# Patient Record
Sex: Female | Born: 1965
Health system: Southern US, Community
[De-identification: ages and names within clinical notes are randomized; demographics above are authoritative.]

## PROBLEM LIST (undated history)

## (undated) DIAGNOSIS — L930 Discoid lupus erythematosus: Secondary | ICD-10-CM

## (undated) DIAGNOSIS — J45909 Unspecified asthma, uncomplicated: Secondary | ICD-10-CM

## (undated) DIAGNOSIS — F419 Anxiety disorder, unspecified: Secondary | ICD-10-CM

## (undated) DIAGNOSIS — R569 Unspecified convulsions: Secondary | ICD-10-CM

## (undated) DIAGNOSIS — G4730 Sleep apnea, unspecified: Secondary | ICD-10-CM

## (undated) DIAGNOSIS — R51 Headache: Secondary | ICD-10-CM

## (undated) DIAGNOSIS — T7840XA Allergy, unspecified, initial encounter: Secondary | ICD-10-CM

## (undated) DIAGNOSIS — G4700 Insomnia, unspecified: Secondary | ICD-10-CM

## (undated) DIAGNOSIS — N189 Chronic kidney disease, unspecified: Secondary | ICD-10-CM

## (undated) DIAGNOSIS — J449 Chronic obstructive pulmonary disease, unspecified: Secondary | ICD-10-CM

## (undated) DIAGNOSIS — K219 Gastro-esophageal reflux disease without esophagitis: Secondary | ICD-10-CM

## (undated) DIAGNOSIS — M779 Enthesopathy, unspecified: Secondary | ICD-10-CM

## (undated) DIAGNOSIS — K7460 Unspecified cirrhosis of liver: Secondary | ICD-10-CM

## (undated) HISTORY — DX: Discoid lupus erythematosus: L93.0

## (undated) HISTORY — PX: DIAGNOSTIC LAPAROSCOPY: SUR761

## (undated) HISTORY — DX: Sleep apnea, unspecified: G47.30

## (undated) HISTORY — PX: KIDNEY SURGERY: SHX687

## (undated) HISTORY — DX: Unspecified cirrhosis of liver: K74.60

## (undated) HISTORY — DX: Chronic obstructive pulmonary disease, unspecified: J44.9

## (undated) HISTORY — PX: RIGHT OOPHORECTOMY: SHX2359

## (undated) HISTORY — PX: CHOLECYSTECTOMY: SHX55

## (undated) HISTORY — PX: UPPER GASTROINTESTINAL ENDOSCOPY: SHX188

## (undated) HISTORY — DX: Allergy, unspecified, initial encounter: T78.40XA

## (undated) HISTORY — PX: FOOT SURGERY: SHX648

## (undated) HISTORY — PX: COLONOSCOPY: SHX174

## (undated) HISTORY — PX: APPENDECTOMY: SHX54

## (undated) HISTORY — PX: ABDOMINAL HYSTERECTOMY: SHX81

---
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
## 1999-05-28 ENCOUNTER — Emergency Department (HOSPITAL_COMMUNITY): Admission: EM | Admit: 1999-05-28 | Discharge: 1999-05-28 | Payer: Self-pay | Admitting: Emergency Medicine

## 2000-01-20 ENCOUNTER — Emergency Department (HOSPITAL_COMMUNITY): Admission: EM | Admit: 2000-01-20 | Discharge: 2000-01-20 | Payer: Self-pay | Admitting: Internal Medicine

## 2000-05-01 ENCOUNTER — Other Ambulatory Visit: Admission: RE | Admit: 2000-05-01 | Discharge: 2000-05-01 | Payer: Self-pay | Admitting: Obstetrics and Gynecology

## 2000-08-12 ENCOUNTER — Ambulatory Visit (HOSPITAL_COMMUNITY): Admission: RE | Admit: 2000-08-12 | Discharge: 2000-08-12 | Payer: Self-pay | Admitting: Obstetrics and Gynecology

## 2002-09-08 ENCOUNTER — Other Ambulatory Visit: Admission: RE | Admit: 2002-09-08 | Discharge: 2002-09-08 | Payer: Self-pay | Admitting: Obstetrics and Gynecology

## 2003-08-06 ENCOUNTER — Encounter: Payer: Self-pay | Admitting: Obstetrics and Gynecology

## 2003-08-11 ENCOUNTER — Encounter (INDEPENDENT_AMBULATORY_CARE_PROVIDER_SITE_OTHER): Payer: Self-pay | Admitting: Specialist

## 2003-08-11 ENCOUNTER — Inpatient Hospital Stay (HOSPITAL_COMMUNITY): Admission: RE | Admit: 2003-08-11 | Discharge: 2003-08-13 | Payer: Self-pay | Admitting: Obstetrics and Gynecology

## 2003-08-11 ENCOUNTER — Encounter (INDEPENDENT_AMBULATORY_CARE_PROVIDER_SITE_OTHER): Payer: Self-pay

## 2003-08-23 ENCOUNTER — Encounter: Admission: RE | Admit: 2003-08-23 | Discharge: 2003-08-23 | Payer: Self-pay | Admitting: Obstetrics and Gynecology

## 2003-08-23 ENCOUNTER — Encounter: Payer: Self-pay | Admitting: Obstetrics and Gynecology

## 2004-08-07 ENCOUNTER — Emergency Department (HOSPITAL_COMMUNITY): Admission: EM | Admit: 2004-08-07 | Discharge: 2004-08-07 | Payer: Self-pay | Admitting: Emergency Medicine

## 2004-08-11 ENCOUNTER — Observation Stay (HOSPITAL_COMMUNITY): Admission: RE | Admit: 2004-08-11 | Discharge: 2004-08-12 | Payer: Self-pay | Admitting: General Surgery

## 2004-08-11 ENCOUNTER — Encounter (INDEPENDENT_AMBULATORY_CARE_PROVIDER_SITE_OTHER): Payer: Self-pay | Admitting: *Deleted

## 2006-09-05 ENCOUNTER — Emergency Department (HOSPITAL_COMMUNITY): Admission: EM | Admit: 2006-09-05 | Discharge: 2006-09-05 | Payer: Self-pay | Admitting: Emergency Medicine

## 2006-09-17 ENCOUNTER — Encounter: Admission: RE | Admit: 2006-09-17 | Discharge: 2006-10-08 | Payer: Self-pay | Admitting: Family Medicine

## 2007-02-02 ENCOUNTER — Emergency Department (HOSPITAL_COMMUNITY): Admission: EM | Admit: 2007-02-02 | Discharge: 2007-02-02 | Payer: Self-pay | Admitting: Emergency Medicine

## 2009-01-26 ENCOUNTER — Ambulatory Visit: Payer: Self-pay | Admitting: Family Medicine

## 2009-01-26 ENCOUNTER — Encounter (INDEPENDENT_AMBULATORY_CARE_PROVIDER_SITE_OTHER): Payer: Self-pay | Admitting: *Deleted

## 2009-01-26 DIAGNOSIS — L930 Discoid lupus erythematosus: Secondary | ICD-10-CM | POA: Insufficient documentation

## 2009-01-26 DIAGNOSIS — J019 Acute sinusitis, unspecified: Secondary | ICD-10-CM

## 2009-02-09 ENCOUNTER — Ambulatory Visit: Payer: Self-pay | Admitting: Family Medicine

## 2009-03-09 ENCOUNTER — Ambulatory Visit: Payer: Self-pay | Admitting: Family Medicine

## 2009-03-09 DIAGNOSIS — M25579 Pain in unspecified ankle and joints of unspecified foot: Secondary | ICD-10-CM

## 2009-04-30 HISTORY — PX: ANKLE RECONSTRUCTION: SHX1151

## 2009-05-06 ENCOUNTER — Telehealth (INDEPENDENT_AMBULATORY_CARE_PROVIDER_SITE_OTHER): Payer: Self-pay | Admitting: *Deleted

## 2009-05-12 ENCOUNTER — Encounter: Payer: Self-pay | Admitting: Family Medicine

## 2010-02-15 ENCOUNTER — Ambulatory Visit: Payer: Self-pay | Admitting: Family Medicine

## 2010-04-03 ENCOUNTER — Ambulatory Visit: Payer: Self-pay | Admitting: Family Medicine

## 2010-04-03 ENCOUNTER — Telehealth (INDEPENDENT_AMBULATORY_CARE_PROVIDER_SITE_OTHER): Payer: Self-pay | Admitting: *Deleted

## 2010-04-03 ENCOUNTER — Telehealth: Payer: Self-pay | Admitting: Family Medicine

## 2010-04-06 ENCOUNTER — Telehealth (INDEPENDENT_AMBULATORY_CARE_PROVIDER_SITE_OTHER): Payer: Self-pay | Admitting: *Deleted

## 2010-05-16 ENCOUNTER — Telehealth (INDEPENDENT_AMBULATORY_CARE_PROVIDER_SITE_OTHER): Payer: Self-pay | Admitting: *Deleted

## 2010-05-22 ENCOUNTER — Ambulatory Visit: Payer: Self-pay | Admitting: Family Medicine

## 2010-05-22 DIAGNOSIS — H6690 Otitis media, unspecified, unspecified ear: Secondary | ICD-10-CM | POA: Insufficient documentation

## 2010-05-22 DIAGNOSIS — L049 Acute lymphadenitis, unspecified: Secondary | ICD-10-CM

## 2010-05-25 ENCOUNTER — Telehealth (INDEPENDENT_AMBULATORY_CARE_PROVIDER_SITE_OTHER): Payer: Self-pay | Admitting: *Deleted

## 2010-06-09 ENCOUNTER — Ambulatory Visit: Payer: Self-pay | Admitting: Family Medicine

## 2011-01-22 ENCOUNTER — Ambulatory Visit
Admission: RE | Admit: 2011-01-22 | Discharge: 2011-01-22 | Payer: Self-pay | Source: Home / Self Care | Attending: Family Medicine | Admitting: Family Medicine

## 2011-01-22 ENCOUNTER — Ambulatory Visit (HOSPITAL_BASED_OUTPATIENT_CLINIC_OR_DEPARTMENT_OTHER)
Admission: RE | Admit: 2011-01-22 | Discharge: 2011-01-22 | Payer: Self-pay | Source: Home / Self Care | Attending: Family Medicine | Admitting: Family Medicine

## 2011-01-22 LAB — CONVERTED CEMR LAB
Blood in Urine, dipstick: NEGATIVE
Ketones, urine, test strip: NEGATIVE
Nitrite: NEGATIVE
Urobilinogen, UA: 0.2
WBC Urine, dipstick: NEGATIVE

## 2011-01-23 ENCOUNTER — Telehealth: Payer: Self-pay | Admitting: Family Medicine

## 2011-01-30 NOTE — Assessment & Plan Note (Signed)
Summary: COUGH/RH.....   Vital Signs:  Patient profile:   45 year old female Height:      61.75 inches Weight:      185 pounds BMI:     34.23 Temp:     98.3 degrees F oral Pulse rate:   82 / minute Pulse rhythm:   regular BP sitting:   128 / 82  (left arm) Cuff size:   regular  Vitals Entered By: Army Fossa CMA (February 15, 2010 9:37 AM) CC: cough , congestion   History of Present Illness:  Cough      This is a 45 year old woman who presents with Cough.  The symptoms began 3 weeks ago.  Pt here c/o cough--she has taken Dayquil with no relief.  The patient reports productive cough, wheezing, and fever, but denies non-productive cough, pleuritic chest pain, shortness of breath, exertional dyspnea, hemoptysis, and malaise.  Associated symtpoms include nasal congestion.  The patient denies the following symptoms: cold/URI symptoms, sore throat, chronic rhinitis, weight loss, acid reflux symptoms, and peripheral edema.  The cough is worse with lying down.  Ineffective prior treatments have included OTC cough medication.  Pt just finished clindamycin from the dentist.    Current Medications (verified): 1)  Biaxin Xl Pac 500 Mg Xr24h-Tab (Clarithromycin) .... 2 By Mouth Once Daily 2)  Prednisone 10 Mg Tabs (Prednisone) .... 3 By Mouth Once Daily For 3 Days Then 2 By Mouth Once Daily For 3 Days Then 1 By Mouth Once Daily For 3 Days 3)  Cheratussin Ac 100-10 Mg/41ml Syrp (Guaifenesin-Codeine) .Marland Kitchen.. 1-2 Tsp By Mouth At Bedtime As Needed 4)  Proair Hfa 108 (90 Base) Mcg/act Aers (Albuterol Sulfate) .... 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  Past Medical History: Reviewed history from 01/26/2009 and no changes required. Discoid Lupus -Dr. Yetta Barre dermatologist  Past Surgical History: Reviewed history from 01/26/2009 and no changes required. Appendectomy Hysterectomy-10cm growth removal, [facility]- right kidney ('tube was kinked and they went in and straightened it out') Oophorectomy-right removed  Family History: Reviewed history from 01/26/2009 and no changes required. CAD-mother HTN-mother,father DM-no COLON CA-grandfather BREAST CA-no  Social History: Reviewed history from 01/26/2009 and no changes required. 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  Otoscopic examination reveals clear canals, tympanic membranes are intact bilaterally without bulging, retraction, inflammation or discharge. Hearing is grossly normal bilaterally. Nose:  External nasal examination shows no deformity or inflammation. Nasal mucosa are pink and moist without lesions or exudates. Mouth:  Oral mucosa and oropharynx without lesions or exudates.  Teeth in good repair. Neck:  cervical lymphadenopathy.   Lungs:  R decreased breath sounds, R wheezes, L decreased breath sounds, and L wheezes.   Heart:  normal rate and no murmur.   Psych:  Oriented X3 and normally interactive.     Impression & Recommendations:  Problem # 1:  BRONCHITIS- ACUTE (ICD-466.0)  The following  medications were removed from the medication list:    Tussionex Pennkinetic Er 8-10 Mg/72ml Lqcr (Chlorpheniramine-hydrocodone) .Marland KitchenMarland KitchenMarland KitchenMarland Kitchen 5 ml by mouth q12 as needed for cough.  disp Her updated  medication list for this problem includes:    Biaxin Xl Pac 500 Mg Xr24h-tab (Clarithromycin) .Marland Kitchen... 2 by mouth once daily    Cheratussin Ac 100-10 Mg/13ml Syrp (Guaifenesin-codeine) .Marland Kitchen... 1-2 tsp by mouth at bedtime as needed    Proair Hfa 108 (90 Base) Mcg/act Aers (Albuterol sulfate) .Marland Kitchen... 2 puffs qid as needed  Take antibiotics and other medications as directed. Encouraged to push clear liquids, get enough rest, and take acetaminophen as needed. To be seen in 5-7 days if no improvement, sooner if worse.  Orders: Admin of Therapeutic Inj  intramuscular or subcutaneous (16109) Depo- Medrol 80mg  (J1040) Nebulizer Tx (60454) Albuterol Sulfate Sol 1mg  unit dose (U9811)  Complete Medication List: 1)  Biaxin Xl Pac 500 Mg Xr24h-tab (Clarithromycin) .... 2 by mouth once daily 2)  Prednisone 10 Mg Tabs (Prednisone) .... 3 by mouth once daily for 3 days then 2 by mouth once daily for 3 days then 1 by mouth once daily for 3 days 3)  Cheratussin Ac 100-10 Mg/50ml Syrp (Guaifenesin-codeine) .Marland Kitchen.. 1-2 tsp by mouth at bedtime as needed 4)  Proair Hfa 108 (90 Base) Mcg/act Aers (Albuterol sulfate) .... 2 puffs qid as needed Prescriptions: PROAIR HFA 108 (90 BASE) MCG/ACT AERS (ALBUTEROL SULFATE) 2 puffs qid as needed  #1 x 1   Entered and Authorized by:   Loreen Freud DO   Signed by:   Loreen Freud DO on 02/15/2010   Method used:   Electronically to        [facility]* (retail)       [address] [place], [place]  [address]       Ph: [phone] or [phone]       Fax: [phone]   RxID:   [id number] CHERATUSSIN AC 100-10 MG/5ML SYRP (GUAIFENESIN-CODEINE) 1-2 tsp by mouth at bedtime as needed  #6 oz x 0   Entered and Authorized by:   Loreen Freud DO   Signed by:   Loreen Freud DO on 02/15/2010   Method used:   Print then Give to Patient   RxID:   [id number] PREDNISONE 10 MG TABS (PREDNISONE) 3 by mouth once daily for 3  days then 2 by mouth once daily for 3 days then 1 by mouth once daily for 3 days  #18 x 0   Entered and Authorized by:   Loreen Freud DO   Signed by:   Loreen Freud DO on 02/15/2010   Method used:   Electronically to        [facility]* (retail)       [address] [place], [place]  [address]       Ph: [phone] or [phone]       Fax: [phone]   RxID:   [id number] BIAXIN XL PAC 500 MG XR24H-TAB (CLARITHROMYCIN) 2 by mouth once daily  #14 x 0   Entered and Authorized by:   Loreen Freud DO   Signed by:   Loreen Freud DO on 02/15/2010   Method used:   Electronically to        [facility]* (retail)       [address] [place], [place]  [address]       Ph: [phone] or [phone]       Fax: [phone]   RxID:   [id number]    Medication Administration  Injection # 1:    Medication: Depo- Medrol 80mg     Diagnosis: BRONCHITIS- ACUTE (ICD-466.0)    Route: IM    Site: RUOQ gluteus    Exp Date: 09/2010    Lot #: [id number]    Mfr: [email or web address]    Patient tolerated injection without complications    Given by: Army Fossa CMA (February 15, 2010 10:36 AM)  Medication # 1:    Medication: Albuterol Sulfate Sol 1mg  unit dose    Diagnosis: BRONCHITIS- ACUTE (ICD-466.0)  Orders Added: 1)  Admin of Therapeutic Inj  intramuscular or subcutaneous [96372] 2)  Depo- Medrol 80mg  [J1040] 3)  Nebulizer Tx [94854] 4)  Albuterol Sulfate Sol 1mg  unit dose [J7613] 5)  Est. Patient Level IV [62703]

## 2011-01-30 NOTE — Progress Notes (Signed)
Summary: FYI call a nurse  Phone Note Other Incoming   Summary of Call: Triage Call Report Triage Record Num: [id number] Operator: Di Kindle Patient Name: Donna Sullivan Call Date & Time: 04/03/2010 6:16:54AM Patient Phone: [phone] PCP: Lezlie Octave Patient Gender: Female PCP Fax : [phone] Patient DOB: [birth year] Practice Name: [facility] Reason for Call: Pt calling, last seen for bronchitis 02/15/10, out of ABX, still taking prednisone, started to take 04/02/10 because symtpoms have returned for about a week, had temp for 102 for a couple of day, afebrile. Chest hurts when coughs. Guideline: cough, advised of needing appt, to call office when open 04/03/10, verbalizes understanding. Protocol(s) Used: Cough - Adult Recommended Outcome per Protocol: See Provider within 24 hours Reason for Outcome: Sharp, fleeting chest pain only occurring with deep breath or cough AND not responsive to home care Care Advice:  ~ Use a cool mist humidifier to moisten air. Be sure to clean according to manufacturer's instructions.  ~ Limit activities and increase periods of rest.  ~ Call provider if having shortness of breath. 04/  Follow-up for Phone Call        Called pt this am no answer go VM; Left msg for pt to call .Kandice Hams  April 03, 2010 8:16 AM Spoke with pt who did not go to ED over weekend, c/o chest congestion and cough.  OV scheduled for today .Kandice Hams  April 03, 2010 8:58 AM   Follow-up by: Kandice Hams,  April 03, 2010 8:58 AM

## 2011-01-30 NOTE — Assessment & Plan Note (Signed)
Summary: allergies??//dizzy//lch   Vital Signs:  Patient profile:   45 year old female Weight:      196 pounds O2 Sat:      97 % on Room air Temp:     98.6 degrees F Pulse rate:   82 / minute BP sitting:   110 / 80  (left arm)  Vitals Entered By: Doristine Devoid (May 22, 2010 2:06 PM)  O2 Flow:  Room air CC: R ear pain, cough and congestion also w/ some intermittent dizziness    History of Present Illness: 45 yo woman here today for R ear pain, cough, and congestion.  this is 3rd week of sxs.  Tm 102.  + sick contacts (strep).  + sore throat- 'like it is on fire'.  soreness under R armpit as of this AM- painful to put on deodorant.  cough is dry.  nasal congestion is clear.  is on Clarithromycin from the dentist  Allergies (verified): No Known Drug Allergies  Review of Systems      See HPI  Physical Exam  General:  obviously uncomfortable Head:  NCAT, no TTP over sinuses Eyes:  mild injxn Ears:  R TM dull, erythematous, fluid visible L TM mildly retracted Nose:  lateral wall of R nostril raw + congestion Mouth:  + pND Neck:  cervical lymphadenopathy.   Lungs:  coarse BS in R upper lobe L lung CTA Heart:  normal rate and no murmur.   Axillary Nodes:  R axillary LN enlarged and tender.     Impression & Recommendations:  Problem # 1:  COUGH (ICD-786.2) Assessment New given coarse BS in R upper lobe, fever, and duration of sxs will get CXR to r/o PNA.  start Augmentin.  Tussionex for cough. Orders: T-2 View CXR (71020TC)  Problem # 2:  ROM (ICD-382.9) Assessment: New given that pt is on clarithromycin from the dentist will tx w/ augmentin. Her updated medication list for this problem includes:    Augmentin 875-125 Mg Tabs (Amoxicillin-pot clavulanate) .Marland Kitchen... 1 by mouth 2 times daily  Problem # 3:  ACUTE LYMPHADENITIS (ICD-683) Assessment: New pt very tender in R axilla.  Augmentin for R OM will cover.  recheck in 2 weeks. Her updated medication list for this  problem includes:    Augmentin 875-125 Mg Tabs (Amoxicillin-pot clavulanate) .Marland Kitchen... 1 by mouth 2 times daily  Complete Medication List: 1)  Proair Hfa 108 (90 Base) Mcg/act Aers (Albuterol sulfate) .... 2 puffs qid as needed 2)  Augmentin 875-125 Mg Tabs (Amoxicillin-pot clavulanate) .Marland Kitchen.. 1 by mouth 2 times daily 3)  Tussionex Pennkinetic Er 8-10 Mg/61ml Lqcr (Chlorpheniramine-hydrocodone) .Marland Kitchen.. 1 tsp q12 as needed for cough.  disp  Patient Instructions: 1)  Please schedule a follow-up appointment in 2 weeks to recheck your lymph node. 2)  Go to [address] to get your chest xray 3)  Use the codeine cough syrup as needed 4)  Ibuprofen for pain/fever 5)  Take the Augmentin as directed- take w/ food to avoid upset stomach.  If you develop yeast symptoms just call me 6)  Use your albuterol inhaler for the wheezing 7)  Call with any questions or concerns 8)  Hang in there!!  Prescriptions: TUSSIONEX PENNKINETIC ER 8-10 MG/5ML LQCR (CHLORPHENIRAMINE-HYDROCODONE) 1 tsp Q12 as needed for cough.  disp  #150 x 0   Entered and Authorized by:   Neena Rhymes MD   Signed by:   Neena Rhymes MD on 05/22/2010   Method used:  Print then Give to Patient   RxID:   [id number] CHERATUSSIN AC 100-10 MG/5ML SYRP (GUAIFENESIN-CODEINE) 1-2 tsp by mouth at bedtime as needed  #6 oz x 0   Entered and Authorized by:   Neena Rhymes MD   Signed by:   Neena Rhymes MD on 05/22/2010   Method used:   Print then Give to Patient   RxID:   [id number] AUGMENTIN 875-125 MG TABS (AMOXICILLIN-POT CLAVULANATE) 1 by mouth 2 times daily  #20 x 0   Entered and Authorized by:   Neena Rhymes MD   Signed by:   Neena Rhymes MD on 05/22/2010   Method used:   Print then Give to Patient   RxID:   [id number]

## 2011-01-30 NOTE — Progress Notes (Signed)
Summary: fyi seen 4/4/11o better  Phone Note Call from Patient Call back at Home Phone [phone]   Caller: Patient Summary of Call: Seen 04/03/10 still has cough Used Cheratussin syrup pt says rx almost gone and did not help with cough -Tongue is coated and blisters in mouth and around mouth, informed pt to D/C azithromycin, pt says she has 1 pill left and will take --Pt has Nystatin she has been using for 2 days  from dentist office, and has 2 prednisone left. --Low grade fever arond 99 .Kandice Hams  April 06, 2010 3:07 PM   Follow-up for Phone Call        pt was informed that the medicine will be effective for 10-12 days.  she was only seen 3 days ago.  must give it more time.  if pt is having trouble w/ cough can buy Delsym OTC.  steroids can cause mouth yeast but she is already using Nystatin.  should continue.  can stop steroids.  if her illness is viral and not bacterial, no abx will help- only time.  pt should be reassured that CXR was normal.  she should hang in there- this will get better. Follow-up by: Neena Rhymes MD,  April 06, 2010 3:24 PM  Additional Follow-up for Phone Call Additional follow up Details #1::        pt informed of Dr Beverely Low recommendations .Kandice Hams  April 06, 2010 4:13 PM  Additional Follow-up by: Kandice Hams,  April 06, 2010 4:13 PM

## 2011-01-30 NOTE — Progress Notes (Signed)
  Phone Note Call from Patient Call back at [facility] Phone [phone]   Caller: Patient Summary of Call: pt c/o dizziness when standing up, Pt is taking her meds. Per Dr Beverely Low will call in some Meclizine pt uses [facility] Initial call taken by: Kandice Hams,  May 25, 2010 4:31 PM    New/Updated Medications: MECLIZINE HCL 25 MG TABS (MECLIZINE HCL) 1 by mouth every 6 hours as needed Prescriptions: MECLIZINE HCL 25 MG TABS (MECLIZINE HCL) 1 by mouth every 6 hours as needed  #30 x 0   Entered by:   Kandice Hams   Authorized by:   Neena Rhymes MD   Signed by:   Kandice Hams on 05/25/2010   Method used:   Faxed to ...       [facility]* (retail)       [address] [place], [place]  [address]       Ph: [phone] or [phone]       Fax: [phone]   RxID:   [id number]

## 2011-01-30 NOTE — Assessment & Plan Note (Signed)
Summary: cough,chest congestion,sore throat/alr   Vital Signs:  Patient profile:   45 year old female Weight:      196 pounds O2 Sat:      97 % on Room air Temp:     99.0 degrees F oral Pulse rate:   102 / minute BP sitting:   120 / 80  (left arm)  Vitals Entered By: Doristine Devoid (April 03, 2010 2:55 PM)  O2 Flow:  Room air CC: cough and chest congestion some SOB from excess coughing  Comments -has been using Mucinex,Motrin,Dulera, w/o no relief   History of Present Illness: 45 yo woman here today w/ cough and chest congestion since 2/15- dx'd w/ bronchitis and tx'd w/ abx, steroids, cough meds, inhalers.  Improved until husband got sick 1 week ago.  pt started w/ fever to 102 on Wed w/ body aches, cough, chest congestion.  started Prednisone last night to improve breathing.  pt called last night w/ SOB, rapid heart rate, sweats.  pt reports cough and inability to sleep are most distressing.  pt is current smoker.  ran out of rescue inhaler yesterday.  was also on Dulera during last illness.  using mucinex, nyquil w/out relief.  Current Medications (verified): 1)  Cheratussin Ac 100-10 Mg/27ml Syrp (Guaifenesin-Codeine) .Marland Kitchen.. 1-2 Tsp By Mouth At Bedtime As Needed 2)  Proair Hfa 108 (90 Base) Mcg/act Aers (Albuterol Sulfate) .... 2 Puffs Qid As Needed 3)  Azithromycin 250 Mg  Tabs (Azithromycin) .... 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     Impression & Recommendations:  Problem # 1:  BRONCHITIS- ACUTE (ICD-466.0) Assessment New pt w/ bronchitis clinically.  will check CXR to r/o PNA.  start Azithro.  pt to finish steroids that she was given at last visit but never took.  use albuterol as needed- no wheezes appreciated today.  cough syrup as needed.  reviewed supportive care and red flags that should prompt return.  Pt expresses understanding and is in agreement w/ this plan. Her updated medication list for this problem includes:    Cheratussin Ac 100-10 Mg/17ml Syrp (Guaifenesin-codeine) .Marland Kitchen... 1-2 tsp by mouth at bedtime as needed    Proair Hfa 108 (90 Base) Mcg/act Aers (Albuterol sulfate) .Marland Kitchen... 2 puffs qid as needed    Azithromycin 250 Mg Tabs (Azithromycin) .Marland Kitchen... 2 by  mouth today and then 1 daily for 4 days  Orders: T-2 View CXR (71020TC)  Complete Medication List: 1)  Cheratussin Ac 100-10 Mg/15ml Syrp (Guaifenesin-codeine) .Marland Kitchen.. 1-2 tsp by mouth at bedtime as needed 2)  Proair Hfa 108 (90 Base) Mcg/act Aers (Albuterol sulfate) .... 2 puffs qid as needed 3)  Azithromycin 250 Mg Tabs (Azithromycin) .... 2 by  mouth today and then 1 daily for 4 days  Patient Instructions: 1)  Follow  up as needed 2)  Go to [address] to get your chest xray- we'll call you with the results 3)  Take the Azithromycin as directed 4)  Drink plenty of fluids 5)  Use the ProAir- 2 puffs every 4 hours as needed 6)  Tylenol/Motrin as needed for fever or pain 7)  Take your remaining prednisone 8)  If no improvement in the next 5-7 days, please call!! 9)  Hang in there!!! Prescriptions: AZITHROMYCIN 250 MG  TABS (AZITHROMYCIN) 2 by  mouth today and then 1 daily for 4 days  #6 x 0   Entered and Authorized by:   Neena Rhymes MD   Signed by:   Neena Rhymes MD on  04/03/2010   Method used:   Print then Give to Patient   RxID:   [id number] PROAIR HFA 108 (90 BASE) MCG/ACT AERS (ALBUTEROL SULFATE) 2 puffs qid as needed  #1 x 1   Entered and Authorized by:   Neena Rhymes MD   Signed by:   Neena Rhymes MD on 04/03/2010   Method used:   Print then Give to Patient   RxID:   [id number] CHERATUSSIN AC 100-10 MG/5ML SYRP (GUAIFENESIN-CODEINE) 1-2 tsp by mouth at bedtime as needed  #6 oz x 0   Entered and Authorized by:   Neena Rhymes MD   Signed by:   Neena Rhymes MD on 04/03/2010   Method used:   Print then Give to Patient   RxID:   [id number]

## 2011-01-30 NOTE — Progress Notes (Signed)
Summary: phone/  Phone Note Call from Patient Call back at [facility] Phone [phone]   Caller: Patient Summary of Call: PT CALLED LEFT STILL  NOT FEELING WELL  CALLED PT BACK MAILBOX IS FULL CANNOT LEAVE MSG WILL TRY IN AM .Kandice Hams  May 16, 2010 5:04 PM CALLED PT THIS AM MAIBOX FULL COULD NOT LEAVE MSG .Kandice Hams  May 17, 2010 8:52 AM  Pt called back, c/o cough congestion, some sob, OV scheduled .Kandice Hams  May 17, 2010 11:09 AM      Initial call taken by: Kandice Hams,  May 16, 2010 5:04 PM

## 2011-01-30 NOTE — Progress Notes (Signed)
Summary: xray results-  Phone Note Outgoing Call   Call placed by: Doristine Devoid,  April 03, 2010 5:03 PM Call placed to: Patient Summary of Call: no PNA.  continue with plan as discussed at office visit   Follow-up for Phone Call        left message on machine ...Marland KitchenMarland KitchenDoristine Devoid  April 03, 2010 5:03 PM   patient aware of xray results says she is having a hard time getting some sleep at night and wanted to know if she go get something called into pharmacy says she only needs 1 pill has been taking cough med but it keeps her up at night. Informed that I will informed Dr. Beverely Low and let her know.Marland KitchenMarland KitchenMarland KitchenDoristine Devoid  April 04, 2010 10:09 AM   [facility]  Additional Follow-up for Phone Call Additional follow up Details #1::        Ok for Tylenol or Advil PM...if not effective can give samples of Edular 10mg . Additional Follow-up by: Neena Rhymes MD,  April 04, 2010 10:23 AM    Additional Follow-up for Phone Call Additional follow up Details #2::    spoke w/ patient says she is unable to take tylenol pm or ambien b/c they keep her up suggested she take benadryl to see if this helps..........Marland KitchenDoristine Devoid  April 04, 2010 10:31 AM

## 2011-01-30 NOTE — Assessment & Plan Note (Signed)
Summary: f/u in 2 weeks//lch// res cbs   Vital Signs:  Patient profile:   45 year old female Weight:      193 pounds Pulse rate:   86 / minute BP sitting:   132 / 70  (left arm)  Vitals Entered By: Doristine Devoid (June 09, 2010 11:30 AM) CC: 2 week roa doing much better   History of Present Illness: 45 yo woman here today for f/u on recent URI.  had difficulty tolerating Augmentin- 'it knocked me down, i couldn't pick my head up'.  cough is much improved, sinus pressure much improved, lymph nodes less sore- not swollen.  'i feel great'.  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  wants to know what to do to boost immune sxs.  Current Medications (verified): 1)  Proair Hfa 108 (90 Base) Mcg/act Aers (Albuterol Sulfate) .... 2 Puffs Qid As Needed 2)  Meclizine Hcl 25 Mg Tabs (Meclizine Hcl) .Marland Kitchen.. 1 By Mouth Every 6 Hours As Needed 3)  Nystatin 100000 Unit/ml Susp (Nystatin) .... As Needed For Mouth Blisters  Allergies (verified): No Known Drug Allergies  Review of Systems      See HPI  Physical Exam  General:  Well-developed,well-nourished,in no acute distress; alert,appropriate and cooperative throughout examination Head:  NCAT, no TTP over sinuses Eyes:  no injxn or inflammation Ears:  TMs normal Nose:  External nasal examination shows no deformity or inflammation. Nasal mucosa are pink and moist without lesions or exudates. Mouth:  Oral mucosa and oropharynx without lesions or exudates.  Teeth in good repair. Neck:  no LAD Lungs:  CTAB, still w/ hacking cough Heart:  normal rate and no murmur.   Axillary Nodes:  No palpable lymphadenopathy   Impression & Recommendations:  Problem # 1:  COUGH (ICD-786.2) Assessment Improved improved from previous visit.  still present but much less bothersome.  encouraged smoking cessation  Problem # 2:  ACUTE LYMPHADENITIS (ICD-683) Assessment: Improved resolved after course of  augmentin  Problem # 3:  ROM (ICD-382.9) Assessment: Improved resolved after abx course  Complete Medication List: 1)  Proair Hfa 108 (90 Base) Mcg/act Aers (Albuterol sulfate) .... 2 puffs qid as needed 2)  Meclizine Hcl 25 Mg Tabs (Meclizine hcl) .Marland Kitchen.. 1 by mouth every 6 hours as needed 3)  Nystatin 100000 Unit/ml Susp (Nystatin) .... As needed for mouth blisters  Patient Instructions: 1)  Please schedule a complete physical at your convenience- do not eat before this appt 2)  Stop Smoking!!!  This will help you stay healthy! 3)  Vitamin C/Zinc/AirBorne will help boost your immune system! 4)  Enjoy your party!!!

## 2011-02-01 NOTE — Progress Notes (Signed)
Summary: Reaction to   Phone Note Refill Request   Refills Requested: Medication #1:  AUGMENTIN 875-125 MG TABS 1 by mouth 2 times daily x10 days.  take w/ food. Pt c/o dizziness, fatigue/sleepy and nausea since starting med. Pt states that she is currently taking med with food..............Marland KitchenFelecia Deloach CMA  January 23, 2011 10:39 AM    Follow-up for Phone Call        change to ceftin 500 #20 1 by mouth two times a day for 10 days  Follow-up by: Loreen Freud DO,  January 23, 2011 11:57 AM  Additional Follow-up for Phone Call Additional follow up Details #1::        Pt aware Rx sent to pharmacy............Marland KitchenFelecia Deloach CMA  January 23, 2011 12:09 PM    New Allergies: ! AUGMENTIN New/Updated Medications: CEFTIN 500 MG TABS (CEFUROXIME AXETIL) Take 1 by mouth two times a day for 10 days New Allergies: ! AUGMENTINPrescriptions: CEFTIN 500 MG TABS (CEFUROXIME AXETIL) Take 1 by mouth two times a day for 10 days  #20 x 0   Entered by:   Jeremy Johann CMA   Authorized by:   Loreen Freud DO   Signed by:   Jeremy Johann CMA on 01/23/2011   Method used:   Faxed to ...       [facility]* (retail)       [address] [place], [place]  [address]       Ph: [phone] or [phone]       Fax: [phone]   RxID:   [id number]

## 2011-02-01 NOTE — Assessment & Plan Note (Signed)
Summary: cough x 4 weeks//lch   Vital Signs:  Patient profile:   45 year old female Weight:      202 pounds BMI:     37.38 Temp:     98.3 degrees F oral BP sitting:   116 / 74  (left arm)  Vitals Entered By: Doristine Devoid CMA (January 22, 2011 3:54 PM) CC: cough x1 month also some blood has been productive along w/ HA and sinus pressure ears feel stuffed   History of Present Illness: 45 yo woman here today for cough and ? sinus infxn.  sxs started 1 month ago w/ cough.  head is full, painful.  + sinus pain/pressure.  bilateral ear fullness.  having lower abdominal pain w/ urination, frequency.  cough is productive of blood tinged sputum.  no fevers.  + sick contacts.  difficulty sleeping due to cough.  Current Medications (verified): 1)  Proair Hfa 108 (90 Base) Mcg/act Aers (Albuterol Sulfate) .... 2 Puffs Qid As Needed 2)  Nystatin 100000 Unit/ml Susp (Nystatin) .... As Needed For Mouth Blisters  Allergies (verified): No Known Drug Allergies  Past History:  Past medical, surgical, family and social histories (including risk factors) reviewed for relevance to current acute and chronic problems.  Past Medical History: Reviewed history from 01/26/2009 and no changes required. Discoid Lupus -Dr. Yetta Barre dermatologist  Past Surgical History: Reviewed history from 04/03/2010 and no changes required. Appendectomy Hysterectomy-10cm growth removal, [facility] surgery- right kidney ('tube was kinked and they went in and straightened it out') Oophorectomy-right removed L ankle reconstruction-04/2009  Family History: Reviewed history from 01/26/2009 and no changes required. CAD-mother HTN-mother,father DM-no COLON CA-grandfather BREAST CA-no  Social History: Reviewed history from 01/26/2009 and no changes required. 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  loud, harsh cough Head:  NCAT, no TTP over frontal but + TTP over maxillary sinuses Eyes:  no injxn or inflammation Ears:  TMs retracted bilaterally Nose:  + congestion Mouth:  pharyngeal erythema Neck:  no LAD Lungs:  CTAB, w/ hacking cough Heart:  normal rate and no murmur.   Abdomen:  soft, mild suprapubic tenderness, no rebound/guarding   Impression & Recommendations:  Problem # 1:  COUGH (ICD-786.2) Assessment New given duration of cough and severity- check CXR.  start Augmentin to cover both sinuses and bronchitis.  tessalon and Tussionex for cough as needed. Orders: T-2 View CXR (71020TC)  Problem # 2:  SINUSITIS- ACUTE-NOS (ICD-461.9) Assessment: Unchanged start Augmentin for maxillary sinusitis.  reviewed supportive care and red flags that should prompt return.  Pt expresses understanding and is in agreement w/ this plan. Her updated medication list for this problem includes:    Augmentin 875-125 Mg Tabs (Amoxicillin-pot clavulanate) .Marland Kitchen... 1 by mouth 2 times daily x10 days.  take w/ food.    Tessalon 200 Mg Caps (Benzonatate) .Marland Kitchen... Take one capsule by mouth three times a day as needed for cough    Tussionex Pennkinetic Er 10-8 Mg/106ml Lqcr (Hydrocod polst-chlorphen polst) .Marland Kitchen... 1 tsp q12 as needed for cough.  Problem # 3:  DYSURIA (ICD-788.1) Assessment: New  UA (-) for signs of infxn.  most likely due to abdominal strain from coughing.  augmentin for sinusitis will cover possible infxn. Her updated medication list for this problem includes:    Augmentin 875-125 Mg Tabs (Amoxicillin-pot clavulanate) .Marland Kitchen... 1 by mouth  2 times daily x10 days.  take w/ food.  Orders: UA Dipstick w/o Micro (manual) ([address])  Complete Medication List: 1)  Proair Hfa 108 (90 Base) Mcg/act Aers (Albuterol sulfate) .... 2 puffs qid as needed 2)  Nystatin 100000 Unit/ml Susp (Nystatin) .... As needed for mouth blisters 3)   Augmentin 875-125 Mg Tabs (Amoxicillin-pot clavulanate) .Marland Kitchen.. 1 by mouth 2 times daily x10 days.  take w/ food. 4)  Tessalon 200 Mg Caps (Benzonatate) .... Take one capsule by mouth three times a day as needed for cough 5)  Tussionex Pennkinetic Er 10-8 Mg/66ml Lqcr (Hydrocod polst-chlorphen polst) .Marland Kitchen.. 1 tsp q12 as needed for cough.  Patient Instructions: 1)  Go to the [facility] on [address] or [address] for your chest xray- we'll call you with your lab results 2)  Start the Augmentin for a sinus infection and likely bronchitis 3)  The good news is that your urine looks fine- the frequency and abdominal pain is likely from all the coughing 4)  Use the cough pills for daytime cough and the syrup for nights and weekends 5)  Call with any questions or concerns 6)  Hang in there! Prescriptions: TUSSIONEX PENNKINETIC ER 10-8 MG/5ML LQCR (HYDROCOD POLST-CHLORPHEN POLST) 1 tsp Q12 as needed for cough.  #126ml x 0   Entered and Authorized by:   Neena Rhymes MD   Signed by:   Neena Rhymes MD on 01/22/2011   Method used:   Print then Give to Patient   RxID:   [id number] CHERATUSSIN AC 100-10 MG/5ML SYRP (GUAIFENESIN-CODEINE) 1-2 tsps Q4-6 as needed for cough.  will cause drowsiness  #184ml x 0   Entered and Authorized by:   Neena Rhymes MD   Signed by:   Neena Rhymes MD on 01/22/2011   Method used:   Print then Give to Patient   RxID:   [id number] TESSALON 200 MG CAPS (BENZONATATE) Take one capsule by mouth three times a day as needed for cough  #60 x 0   Entered and Authorized by:   Neena Rhymes MD   Signed by:   Neena Rhymes MD on 01/22/2011   Method used:   Electronically to        [facility]* (retail)       [address] [place], [place]  [address]       Ph: [phone] or [phone]       Fax: [phone]   RxID:   [id number] AUGMENTIN 875-125 MG TABS (AMOXICILLIN-POT  CLAVULANATE) 1 by mouth 2 times daily x10 days.  take w/ food.  #20 x 0   Entered and Authorized by:   Neena Rhymes MD   Signed by:   Neena Rhymes MD on 01/22/2011   Method used:   Electronically to        [facility]* (retail)       [address] [place], [place]  [address]       Ph: [phone] or [phone]       Fax: [phone]   RxID:   [id number]    Orders Added: 1)  T-2 View CXR [71020TC] 2)  Est. Patient Level IV [48546] 3)  UA Dipstick w/o Micro (manual) [81002]    Laboratory Results  Urine Tests    Routine Urinalysis   Glucose: negative   (Normal Range: Negative) Bilirubin: negative   (Normal Range: Negative) Ketone: negative   (Normal Range: Negative) Spec. Gravity: 1.020   (Normal Range: 1.003-1.035) Blood: negative   (Normal Range: Negative) pH: 6.5   (Normal Range: 5.0-8.0) Protein: negative   (Normal Range: Negative) Urobilinogen: 0.2   (Normal Range: 0-1) Nitrite: negative   (Normal Range: Negative) Leukocyte Esterace: negative   (Normal Range: Negative)

## 2011-05-18 NOTE — Discharge Summary (Signed)
   NAMEMARDEL, Donna Sullivan                           ACCOUNT NO.:  [id number]   MEDICAL RECORD NO.:  [id number]                   PATIENT TYPE:  INP   LOCATION:  0444                                 FACILITY:  [facility]   PHYSICIAN:  Zenaida Niece, M.D.             DATE OF BIRTH:  [birth year]   DATE OF ADMISSION:  08/11/2003  DATE OF DISCHARGE:  08/13/2003                                 DISCHARGE SUMMARY   ADMISSION DIAGNOSES:  1. Right pelvic mass.  2. Abnormal uterine bleeding.   DISCHARGE DIAGNOSES:  1. Right ovarian mucinous cyst adenoma.  2. Abnormal bleeding with endometrial polyp.   PROCEDURE:  On August 11, 2003, she had a total abdominal hysterectomy and  right salpingo-oophorectomy.   HISTORY AND PHYSICAL:  Please see chart for full history and physical, but  briefly, this is a 45 year old white female, para 1-0-2-1, with irregular  bleeding since mid May with a 6 x 5 x 7 cm multi-septated cystic tender mass  in the right ovary and a portion of an endometrial polyp removed through the  cervix.  Bleeding currently has been controlled with Provera.   PHYSICAL EXAMINATION:  ABDOMEN:  Significant for a benign abdomen with well-  healed transverse scar.  PELVIC:  Reveals no specific masses, although she is slightly tender on the  right.   HOSPITAL COURSE:  The patient was admitted and underwent the above mentioned  procedure under general anesthesia with an estimated blood loss of 150 cc.  She did have a large multi-cystic right ovary which was a benign mucinous  cyst adenoma by frozen section and permanent section.  Uterus was normal  size and she had a normal left ovary.  Uterus did have an endometrial polyp  by final pathology.  Postoperatively, she had no significant complications  and was able to ambulate, tolerated a diet.  On the morning of postoperative  day #2, she was felt to be stable enough for discharge home.  Preoperative  hemoglobin 15.3, postoperative is  14.1.   FINAL DIAGNOSES:  1. Right ovarian mucinous cyst adenoma.  2. Endometrial polyp.   DISCHARGE INSTRUCTIONS:  1. Regular diet.  2. Pelvic rest.  3. No strenuous activity.   FOLLOWUP:  In approximately four days for staple removal.    MEDICATIONS:  1. Dilaudid 1 mg #40 one to three p.o. q.3-4h. p.r.n. pain.  2. She is to continue all of her pre-admission medications.                                               Zenaida Niece, M.D.    TDM/MEDQ  D:  08/13/2003  T:  08/13/2003  Job:  [id number]

## 2011-05-18 NOTE — Op Note (Signed)
NAMEMARTISHA, Donna Sullivan                           ACCOUNT NO.:  [id number]   MEDICAL RECORD NO.:  [id number]                   PATIENT TYPE:  INP   LOCATION:  0009                                 FACILITY:  [facility]   PHYSICIAN:  Zenaida Niece, M.D.             DATE OF BIRTH:  1966/12/08   DATE OF PROCEDURE:  08/11/2003  DATE OF DISCHARGE:                                 OPERATIVE REPORT   PREOPERATIVE DIAGNOSES:  1. Right pelvic mass.  2. Abnormal uterine bleeding.   POSTOPERATIVE DIAGNOSES:  1. Right ovarian mucinous cystadenoma.  2. Abnormal uterine bleeding.   PROCEDURE:  1. Total abdominal hysterectomy.  2. Right salpingo-oophorectomy.   SURGEON:  Zenaida Niece, M.D.   ASSISTANT:  Malachi Pro. Ambrose Mantle, M.D.   ANESTHESIA:  General endotracheal tube.   ESTIMATED BLOOD LOSS:  150 mL.   FINDINGS:  She had a large, multicystic right ovary which was benign by  frozen section.  She had a normal-sized uterus and normal left ovary and  tube.  Appendix had been previously removed, and she had normal upper  abdomen.   PROCEDURE IN DETAIL:  The patient was taken to the operating room and placed  in the dorsal supine position.  General anesthesia was induced, and she was  placed in mobile stirrups.  Abdomen, perineum, and vagina were prepped and  draped in the usual sterile fashion and a Foley catheter inserted.  Abdomen  was then entered via a vertical incision.  The peritoneum was entered  bluntly and the incision extended superiorly and inferiorly with good  visualization of bowels and bladder.  Exploration revealed a normal upper  abdomen and this large, cystic right ovary, and a normal uterus.  Self-  retaining retractor was placed, and bowels were packed out of the pelvis.  Right infundibulopelvic ligament was grasped with Heaney clamps, transected,  and doubly ligated with #1 chromic.  I was able then to free up some of the  ovarian attachments from the pelvic sidewall  with electrocautery.  One small  cystic portion of the ovary did rupture upon manipulation.  The uteroovarian  pedicle was then clamped, transected, and ligated with #1 chromic, and the  ovary was removed and sent of frozen section.  Both round ligaments were  transected with electrocautery.  The left uteroovarian ligament was clamped,  transected, and doubly ligated with #1 chromic leaving the ovary intact.  Bleeding from the ovarian vessel side of the ovary was controlled with a  figure-of-eight suture of #1 chromic, and the ovary did still appear viable.  Anterior peritoneum was incised across the anterior portion of the uterus  and the bladder pushed inferior.  The uterine arteries were then  skeletonized.  The uterine arteries clamped, transected, and ligated with #1  chromic.  Bladder was pushed further inferior.  Cardinal ligaments,  uterosacral ligaments were clamped, transected, and ligated on each side.  Uterine angles were clamped with Zepplin clamps, transected, and the uterus  and cervix was removed.  The uterine angles were sutured with #1 chromic and  tagged for later use.  The remainder of the vagina was closed with two  figure-of-eight sutures of #1 chromic with adequate hemostasis.  Pelvis was  irrigated and found to be hemostatic.  Bladder was well-inferior to the  incision.  Uterosacral ligaments were plicated in the midline with 0 silk.  All pedicles were then cut after inspection revealed all pedicles to be  hemostatic.  Frozen section at this point returned with a benign right  ovarian mucinous cystadenoma.  Donna Sullivan, M.D. recommended taking out  the appendix, but it has already been removed.  The bowel packs and  retractor were removed from the abdomen.  Subfascial space was irrigated and  made hemostatic with electrocautery.  Fascia was closed in a running  fashion, starting at both ends and meeting in the middle with #1 PDS.  Subcutaneous tissue was  irrigated and made hemostatic with electrocautery.  Subcutaneous tissue was closed with running 3-0 Vicryl, and the skin was  then closed with staples and a sterile dressing.  The patient tolerated the  procedure well, was extubated in the operating room, and taken to the  recovery room in stable condition.  Counts were correct x 2; she received  Ancef 1 g prior to the procedure.  She had PAS hose on throughout the  procedure.                                               Zenaida Niece, M.D.    TDM/MEDQ  D:  08/11/2003  T:  08/11/2003  Job:  [id number]

## 2011-05-18 NOTE — H&P (Signed)
[name], Sullivan                           ACCOUNT NO.:  [id number]   MEDICAL RECORD NO.:  [id number]                   PATIENT TYPE:  INP   LOCATION:  NA                                   FACILITY:  [facility]   PHYSICIAN:  Zenaida Niece, M.D.             DATE OF BIRTH:  03-25-1966   DATE OF ADMISSION:  08/11/2003  DATE OF DISCHARGE:                                HISTORY & PHYSICAL   CHIEF COMPLAINT:  Pelvic mass and abnormal uterine bleeding.   HISTORY OF PRESENT ILLNESS:  This is a 45 year old white female, para 1, 0,  2, 1, who was seen on July 08, 2003, for irregular bleeding since mid-May.  An ultrasound revealed a uterus with a slightly thickened endometrium at 85  mm.  The left ovary is normal.  The right ovary had a 6.8 cm x 5.4 cm x 7.7  cm multiseptated cystic tender mass.  On speculum examination at that time  she also had a large polyp protruding from her cervix.  This was removed and  pathology returned consistent with a benign endometrial-type polyp.  She has  continued to have abnormal bleeding, as well as minimal right pelvic pain.  She has been placed on Provera to help control her bleeding.  She also  complained of urinary incontinence.  On August 05, 2003, at her preoperative  visit she had office cystometrics which revealed findings consistent more  with overactive bladder, than with stress incontinence.  The patient is  admitted for surgical removal of this mass.   PAST OBSTETRICAL HISTORY:  1. Significant for one cesarean section.  2. Two abortions.   PAST MEDICAL HISTORY:  1. Discoid lupus.  2. Headaches.   PAST SURGICAL HISTORY:  1. Cesarean section in 1993.  2. Laparoscopy in 1989.  3. Right kidney/ureter surgery in 1984.  4. Appendectomy in 1983.  5. A cone biopsy in 1990.  6. She had a diagnostic laparoscopy with adhesiolysis in 2001, that was done     for pelvic pain which has resolved.   ALLERGIES:  No known drug allergies.   CURRENT  MEDICATIONS:  Xanax p.r.n.   SOCIAL HISTORY:  The patient does smoke at least 1/2 pack of cigarettes a  day.  Denies alcohol or other drug use.   PHYSICAL EXAMINATION:  GENERAL:  This is a slightly overweight white female,  in no acute distress.  NECK:  Supple without lymphadenopathy or thyromegaly.  LUNGS:  Clear to auscultation.  HEART:  Regular rate and rhythm without murmur.  ABDOMEN:  Soft, nondistended, nontender, without palpable masses.  She has a  well-healed transverse scar.  EXTREMITIES:  No edema, are nontender.  The deep tendon reflexes are 2/4 and  symmetric.  PELVIC:  Reveals no specific masses, although she is slightly tender on the  right.  The exam is slightly difficult due to her body habitus.   LABORATORY  DATA:  Pap smear from September 2003, is within normal limits.  Urine culture from July 14, 2003, is negative.  Polyp biopsy is benign  endometrial polyp.   ASSESSMENT:  Right pelvic mass and abnormal bleeding, probably due to  endometrial polyps.   PLAN:  This situation has been discussed with the patient and she agrees  that we need to proceed with removing this pelvic mass.  Since we are  removing the pelvic mass, and she is known to have an endometrial polyp, we  are going to proceed with a hysterectomy as well.  The risks of surgery,  including bleeding, infection, and damage to the bowels, bladder and ureters  have been discussed with the patient.  She also understands that there is  the small possibility that this pelvic mass is a malignancy.  This would  necessitate further surgery.  The plan is to admit the patient for an exploratory laparotomy with the  removal of this pelvic mass, as well as a total abdominal hysterectomy.  If  the left ovary appears normal and there is no evidence of malignancy, we  will leave the left ovary.  Dr. De Blanch will be on standby for  staging if this is necessary.  The patient is getting a bowel prep   preoperatively.                                                Zenaida Niece, M.D.    TDM/MEDQ  D:  08/10/2003  T:  08/10/2003  Job:  [id number]

## 2011-05-18 NOTE — Op Note (Signed)
Donna Sullivan, Donna Sullivan                           ACCOUNT NO.:  [id number]   MEDICAL RECORD NO.:  [id number]                   PATIENT TYPE:  OBV   LOCATION:  0442                                 FACILITY:  [facility]   PHYSICIAN:  Timothy E. Earlene Plater, M.D.              DATE OF BIRTH:  07/01/1966   DATE OF PROCEDURE:  08/11/2004  DATE OF DISCHARGE:  08/12/2004                                 OPERATIVE REPORT   PREOPERATIVE DIAGNOSES:  Cholecystolithiasis.   POSTOPERATIVE DIAGNOSES:  Cholecystolithiasis.   PROCEDURE:  Laparoscopic cholecystectomy.   SURGEON:  Timothy E. Earlene Plater, M.D.   ASSISTANT:  Vikki Ports, M.D.   ANESTHESIA:  General.   [name] is otherwise healthy and has had increasing right upper quadrant  pain, food intolerance and was recently seen in the emergency room for a  gallstone diagnosis. After careful discussion, she wishes to proceed with  laparoscopic cholecystectomy  as carefully described. She was seen,  evaluated and identified and a permit signed today.   She was taken to the operating room, placed supine, general endotracheal  anesthesia administered. The abdomen was scrubbed, prepped and draped in the  usual fashion.  A 0.25% Marcaine with epinephrine was used prior to each  incision. An infraumbilical incision made, the fascia identified, opened in  the midline some fatty adhesions were bluntly dissected and the Hasson  catheter passed without difficulty. It was sewn in place with a #1 Vicryl.  The abdomen was insufflated.  Under direct vision, the 10 mm trocar placed  in the mid epigastrium, two 5 mm trocars in the right upper quadrant. The  gallbladder appeared indolent, there were adhesions to the omentum.  These  were taken down bluntly. The gallbladder was the grasped, placed on tension,  careful dissection at the base of the gallbladder revealed a normal  appearing cystic duct injuring the gallbladder, this was dissected free. A  complete  window created. A clip was placed near the cystic duct on the  gallbladder side, the cystic duct opened. A Cook catheter passed  percutaneously and placed into the cystic duct. A clip was applied. Using  real-time fluoroscopy, a cholangiogram was carried out and showed rapid  filling of the entire biliary tree with smooth flow and emptying into the  duodenum. No abnormalities were noted.  The clip and catheter were removed,  the cystic duct stump was triply clipped, it was fully divided. The two  branches of the cystic artery were identified and clipped. The gallbladder  was then removed from the  gallbladder bed without incident or  complications. Cautery was used on the gallbladder bed.  Copious irrigation  was carried out until clear. The gallbladder was removed through the  infraumbilical incision which was closed under direct vision with a #1  Vicryl. There were omental adhesions to the anterior abdominal wall around  the umbilicus and inferiorly.  Attention was turned  to the gallbladder,  further irrigation carried out, it was clear.  All irrigant and CO2,  instruments and trocar was removed under direct vision.  Each incision checked. Each wound closed with 3-0 Monocryl. The skin  cleansed and Steri-Strips applied. Dry sterile dressings applied. Final  counts correct, she was then awakened and taken to the recovery room in good  condition.                                               Timothy E. Earlene Plater, M.D.    TED/MEDQ  D:  08/11/2004  T:  08/12/2004  Job:  [id number]   cc:   Pam Drown, M.D.  [address]  [place]  [place] [address]  Fax: [phone]

## 2011-05-18 NOTE — Op Note (Signed)
[facility]  Patient:    Donna Sullivan, Donna Sullivan                        MRN: [id number] Proc. Date: 08/12/00 Adm. Date:  [id number] Attending:  Michaele Offer                           Operative Report  PREOPERATIVE DIAGNOSIS:  Pelvic pain.  POSTOPERATIVE DIAGNOSIS:  Pelvic pain and pelvic adhesions.  PROCEDURE:  Laparoscopic lysis of adhesions.  SURGEON:  Zenaida Niece, M.D.  ANESTHESIA:  General endotracheal tube.  ESTIMATED BLOOD LOSS:  Less than 50 cc.  FINDINGS:  Omentum adherent to the anterior abdominal wall and the colon was adherent to both pelvic and abdominal side walls.  Her right tube was slightly curled and anterior to the right ovary.  Uterus otherwise appeared normal. Anterior and posterior cul-de-sacs were normal.  Left tube and ovary were normal.  Appendix was not visualized.  Liver appeared slightly enlarged.  COUNTS:  Correct.  CONDITION:  Stable.  DESCRIPTION OF PROCEDURE:  After appropriate informed consent was obtained, the patient was taken to the operating room and placed in the dorsal supine position.  General anesthesia was induced and she was placed in low stirrups. Her abdomen was prepped and draped in the usual sterile fashion for a laparoscopic procedure.  A Foley catheter was inserted and a HUMI uterine manipulator was inserted into the endometrial cavity with some difficulty. Her infraumbilical skin was then infiltrated with 0.25% Marcaine and a 3 cm horizontal incision was made.  The fascia was identified and entered sharply, which also entered the peritoneal cavity.  A pursestring suture of 0 Vicryl was placed around the fascia and the Hasson cannula was introduced into the peritoneal cavity.  CO2 gas was then insufflated and placement confirmed by the laparoscope.  Inspection with the laparoscope revealed the above mentioned findings.  A 5 mm port was placed in the left lower quadrant under  direct visualization.  The omental adhesions were taken down with bipolar cautery followed by blunt dissection.  There were no significant adhesions around the uterus, tubes and ovaries except for the right tube being curled up on the right ovary.  This was not able to be taken down without causing damage to the tube so this adhesion was left in place.  The sigmoid colon was adherent to the left pelvic side wall.  These adhesions were taken down sharply with bipolar cautery of vessels.  The ascending colon was adherent to the right side wall and these adhesions were taken down likewise with sharp dissection and cautery to cauterize bleeders.  This achieved fair mobilization of the colon.  There were no further significant adhesions and no further cause for her pelvic pain noted, so at this point the procedure was terminated.  There had been a port also placed in the right lower quadrant under direct visualization to help with mobilizations.  The 5 mm ports were removed and sites found to be hemostatic.  All gas was allowed to deflate from the abdomen and the Hasson cannula was removed.  The previously placed pursestring suture was then tied with good reapproximation of the fascia.  All skin incisions were closed with interrupted sutures of 4-0 Vicryl followed by Steri-Strips and band-aids.  The HUMI manipulator was then removed.  After the adhesions had been taken down I attempted to place methylene blue through  the tubes for chromopertubation.  However, I am not sure that the catheter functioned properly as I was not able to see dye come out either tube and was not able to for sure say that the dye had even distended the uterus, as it did run out the vagina fairly quickly.  Thus, she had an unsuccessful chromopertubation, but again I am not sure that her tubes are blocked.  The patient was extubated in the operating room and tolerated the procedure well and taken to the recovery room in  stable condition. DD:  08/12/00 TD:  08/13/00 Job: [id number]

## 2011-05-24 ENCOUNTER — Emergency Department (HOSPITAL_COMMUNITY)
Admission: EM | Admit: 2011-05-24 | Discharge: 2011-05-24 | Disposition: A | Discharge status: Home / Self Care | Payer: Self-pay | Attending: Emergency Medicine | Admitting: Emergency Medicine

## 2011-05-24 DIAGNOSIS — M779 Enthesopathy, unspecified: Secondary | ICD-10-CM | POA: Insufficient documentation

## 2011-05-24 DIAGNOSIS — M7989 Other specified soft tissue disorders: Secondary | ICD-10-CM | POA: Insufficient documentation

## 2011-05-24 DIAGNOSIS — M129 Arthropathy, unspecified: Secondary | ICD-10-CM | POA: Insufficient documentation

## 2011-05-24 DIAGNOSIS — M329 Systemic lupus erythematosus, unspecified: Secondary | ICD-10-CM | POA: Insufficient documentation

## 2011-06-22 ENCOUNTER — Encounter: Payer: Self-pay | Admitting: Family Medicine

## 2011-07-22 ENCOUNTER — Emergency Department (HOSPITAL_COMMUNITY): Payer: No Typology Code available for payment source

## 2011-07-22 ENCOUNTER — Emergency Department (HOSPITAL_COMMUNITY)
Admission: EM | Admit: 2011-07-22 | Discharge: 2011-07-22 | Disposition: A | Discharge status: Home / Self Care | Payer: No Typology Code available for payment source | Attending: Emergency Medicine | Admitting: Emergency Medicine

## 2011-07-22 DIAGNOSIS — M546 Pain in thoracic spine: Secondary | ICD-10-CM | POA: Insufficient documentation

## 2011-07-22 DIAGNOSIS — S139XXA Sprain of joints and ligaments of unspecified parts of neck, initial encounter: Secondary | ICD-10-CM | POA: Insufficient documentation

## 2011-07-22 DIAGNOSIS — M542 Cervicalgia: Secondary | ICD-10-CM | POA: Insufficient documentation

## 2011-07-22 DIAGNOSIS — F17200 Nicotine dependence, unspecified, uncomplicated: Secondary | ICD-10-CM | POA: Insufficient documentation

## 2011-08-01 ENCOUNTER — Ambulatory Visit (INDEPENDENT_AMBULATORY_CARE_PROVIDER_SITE_OTHER): Payer: No Typology Code available for payment source | Admitting: Family Medicine

## 2011-08-01 ENCOUNTER — Encounter: Payer: Self-pay | Admitting: Family Medicine

## 2011-08-01 DIAGNOSIS — M545 Low back pain: Secondary | ICD-10-CM

## 2011-08-01 MED ORDER — MELOXICAM 15 MG PO TABS: 15.0000 mg | ORAL_TABLET | Freq: Every day | ORAL | Status: AC

## 2011-08-01 MED ORDER — CARISOPRODOL 350 MG PO TABS: 350.0000 mg | ORAL_TABLET | Freq: Three times a day (TID) | ORAL | Status: AC | PRN
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
## 2011-08-01 NOTE — Assessment & Plan Note (Signed)
Pt's current discomfort is all muscular, no joint or bony injuries.  Will switch naproxen which sedates pt for mobic which pt reports she can better tolerate.  Will also start muscle relaxer.  Continue heating pad.  Discussed that due to her lupus her body is going to take longer to heal.  Pt understands.  Will follow.

## 2011-08-01 NOTE — Patient Instructions (Signed)
This all appears to be muscular and should improve with time Take the Mobic daily (with food to avoid upset stomach) Use the Soma as needed Continue your heating pad for pain relief Hang in there!!!

## 2011-08-07 ENCOUNTER — Ambulatory Visit: Payer: No Typology Code available for payment source | Admitting: Family Medicine

## 2011-09-11 ENCOUNTER — Other Ambulatory Visit: Payer: Self-pay

## 2011-09-11 ENCOUNTER — Emergency Department (HOSPITAL_BASED_OUTPATIENT_CLINIC_OR_DEPARTMENT_OTHER)
Admission: EM | Admit: 2011-09-11 | Discharge: 2011-09-12 | Disposition: A | Discharge status: Home / Self Care | Payer: BC Managed Care – PPO | Attending: Emergency Medicine | Admitting: Emergency Medicine

## 2011-09-11 ENCOUNTER — Emergency Department (INDEPENDENT_AMBULATORY_CARE_PROVIDER_SITE_OTHER): Payer: BC Managed Care – PPO

## 2011-09-11 ENCOUNTER — Encounter (HOSPITAL_BASED_OUTPATIENT_CLINIC_OR_DEPARTMENT_OTHER): Payer: Self-pay | Admitting: *Deleted

## 2011-09-11 DIAGNOSIS — R0602 Shortness of breath: Secondary | ICD-10-CM

## 2011-09-11 DIAGNOSIS — R51 Headache: Secondary | ICD-10-CM | POA: Insufficient documentation

## 2011-09-11 DIAGNOSIS — R42 Dizziness and giddiness: Secondary | ICD-10-CM

## 2011-09-11 DIAGNOSIS — F17200 Nicotine dependence, unspecified, uncomplicated: Secondary | ICD-10-CM | POA: Insufficient documentation

## 2011-09-11 LAB — DIFFERENTIAL
Eosinophils Relative: 4 % (ref 0–5)
Lymphs Abs: 3.9 10*3/uL (ref 0.7–4.0)
Monocytes Absolute: 1.1 10*3/uL — ABNORMAL HIGH (ref 0.1–1.0)
Neutro Abs: 10.2 10*3/uL — ABNORMAL HIGH (ref 1.7–7.7)
Neutrophils Relative %: 65 % (ref 43–77)

## 2011-09-11 LAB — COMPREHENSIVE METABOLIC PANEL
Albumin: 3.7 g/dL (ref 3.5–5.2)
Alkaline Phosphatase: 86 U/L (ref 39–117)
BUN: 6 mg/dL (ref 6–23)
CO2: 20 mEq/L (ref 19–32)
Chloride: 104 mEq/L (ref 96–112)
Creatinine, Ser: 0.6 mg/dL (ref 0.50–1.10)
GFR calc Af Amer: >60 mL/min (ref >60)
GFR calc non Af Amer: >60 mL/min (ref >60)
Glucose, Bld: 110 mg/dL — ABNORMAL HIGH (ref 70–99)
Potassium: 3.5 mEq/L (ref 3.5–5.1)
Total Bilirubin: 0.3 mg/dL (ref 0.3–1.2)

## 2011-09-11 LAB — URINALYSIS, ROUTINE W REFLEX MICROSCOPIC
Bilirubin Urine: NEGATIVE
Glucose, UA: NEGATIVE mg/dL
Ketones, ur: NEGATIVE mg/dL
Leukocytes, UA: NEGATIVE
Specific Gravity, Urine: 1.006 (ref 1.005–1.030)
pH: 6 (ref 5.0–8.0)

## 2011-09-11 LAB — CBC
HCT: 45.7 % (ref 36.0–46.0)
Hemoglobin: 15.9 g/dL — ABNORMAL HIGH (ref 12.0–15.0)
MCV: 89.8 fL (ref 78.0–100.0)
WBC: 15.8 10*3/uL — ABNORMAL HIGH (ref 4.0–10.5)

## 2011-09-11 LAB — CARDIAC PANEL(CRET KIN+CKTOT+MB+TROPI)
Relative Index: INVALID (ref 0.0–2.5)
Total CK: 80 U/L (ref 7–177)

## 2011-09-11 MED ORDER — DEXAMETHASONE SODIUM PHOSPHATE 10 MG/ML IJ SOLN
10.0000 mg | Freq: Once | INTRAMUSCULAR | Status: AC
  Administered 2011-09-11: 10 mg via INTRAMUSCULAR
  Filled 2011-09-11: qty 1

## 2011-09-11 MED ORDER — DIPHENHYDRAMINE HCL 25 MG PO CAPS
25.0000 mg | ORAL_CAPSULE | Freq: Once | ORAL | Status: AC
  Administered 2011-09-11: 25 mg via ORAL
  Filled 2011-09-11 (×2): qty 1

## 2011-09-11 MED ORDER — METOCLOPRAMIDE HCL 5 MG/ML IJ SOLN
10.0000 mg | Freq: Once | INTRAMUSCULAR | Status: AC
  Administered 2011-09-11: 10 mg via INTRAMUSCULAR
  Filled 2011-09-11: qty 2

## 2011-09-11 NOTE — ED Notes (Signed)
Pt c/o dizzy and right arm " heavy" . Pt states low pulse 44 and high bp 120/80

## 2011-09-12 MED ORDER — DIAZEPAM 5 MG PO TABS
ORAL_TABLET | ORAL | Status: AC
  Administered 2011-09-12: 5 mg via ORAL
  Filled 2011-09-12: qty 1
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
## 2011-09-12 NOTE — ED Notes (Signed)
See paper charting for downtime. 

## 2011-09-13 ENCOUNTER — Telehealth: Payer: Self-pay | Admitting: *Deleted

## 2011-09-13 NOTE — Telephone Encounter (Signed)
[facility] Triage Call Report Triage Record Num: [id number] Operator: Albertine Grates Patient Name: Donna Sullivan Call Date & Time: 09/11/2011 8:30:11PM Patient Phone: [phone] PCP: Lezlie Octave Patient Gender: Female PCP Fax : [phone] Patient DOB: 02-09-1966 Practice Name: [facility] Reason for Call: States has felt "lightheaded" 9-11. Has checked pulse and has been 40-48. No pain. No shortness of breath. Advised ED. Protocol(s) Used: Irregular Heartbeat Recommended Outcome per Protocol: See ED Immediately Reason for Outcome: Sudden onset of rapid pulse (greater than 120 beats/minute at rest) OR slow pulse (less than 50 beats per minute at rest) Care Advice:

## 2011-09-13 NOTE — Telephone Encounter (Signed)
Is she feeling better?  Does she need ER f/u?

## 2011-09-13 NOTE — Telephone Encounter (Signed)
Pt seen in ED 09-11-11

## 2011-09-13 NOTE — Telephone Encounter (Signed)
i believe based on hx, pt doesn't find codeine based cough syrups effective (please double check this w/ her).  If she can use codeine she can have Cheratussin 2 tsps tid prn for cough, #240 ml, no refills.  If she doesn't want codeine she can have Tussionex 1 tsp Q12 prn for cough, #116ml, no refills but this is much more expensive.

## 2011-09-13 NOTE — Telephone Encounter (Signed)
Dr tabori already did this 

## 2011-09-13 NOTE — Telephone Encounter (Signed)
Pt states that she is doing fine today just has a little headache which is being treated with motrin and a cough. Pt was advise that she had a touch of bronchitis and a sinus infection. Pt was given z-pak and motrin.. Pt notes that the cough is keeping her up at night and would like to have a Rx called in for cough med. Please advise    Pt use [facility].

## 2011-09-13 NOTE — Telephone Encounter (Signed)
Left message to call office

## 2011-09-14 MED ORDER — HYDROCOD POLST-CHLORPHEN POLST 10-8 MG/5ML PO LQCR: 5.0000 mL | Freq: Two times a day (BID) | ORAL | Status: DC | PRN

## 2011-09-14 NOTE — Telephone Encounter (Signed)
Discuss with patient, Rx sent to pharmacy. 

## 2011-10-16 ENCOUNTER — Telehealth: Payer: Self-pay | Admitting: *Deleted

## 2011-10-16 NOTE — Telephone Encounter (Signed)
Pt left VM that she was recently seen in ED for bronchitis and was Rx a z-pak and a steroid. Pt now has yeast infection and would like to get med Rx. Please advise    Tried to call Pt back to get more detail unable to leave VM due to mailbox being full.

## 2011-10-17 MED ORDER — FLUCONAZOLE 150 MG PO TABS: 150.0000 mg | ORAL_TABLET | Freq: Once | ORAL | Status: AC

## 2011-10-17 NOTE — Telephone Encounter (Signed)
Discussed with patient and Rx has been faxed   KP

## 2011-10-17 NOTE — Telephone Encounter (Signed)
Left message to call office

## 2011-10-17 NOTE — Telephone Encounter (Signed)
Ok for Diflucan 150mg x1 dose 

## 2011-11-02 ENCOUNTER — Other Ambulatory Visit: Payer: Self-pay | Admitting: Family Medicine

## 2011-11-02 ENCOUNTER — Encounter: Payer: Self-pay | Admitting: Family Medicine

## 2011-11-02 ENCOUNTER — Ambulatory Visit (INDEPENDENT_AMBULATORY_CARE_PROVIDER_SITE_OTHER): Payer: Managed Care, Other (non HMO) | Admitting: Family Medicine

## 2011-11-02 VITALS — BP 120/75 | HR 105 | Temp 98.0°F | Ht 61.75 in | Wt 195.6 lb

## 2011-11-02 DIAGNOSIS — N393 Stress incontinence (female) (male): Secondary | ICD-10-CM

## 2011-11-02 DIAGNOSIS — R300 Dysuria: Secondary | ICD-10-CM

## 2011-11-02 DIAGNOSIS — N898 Other specified noninflammatory disorders of vagina: Secondary | ICD-10-CM

## 2011-11-02 LAB — POCT URINALYSIS DIPSTICK
Glucose, UA: NEGATIVE
Nitrite, UA: NEGATIVE

## 2011-11-02 MED ORDER — CEPHALEXIN 500 MG PO CAPS: 500.0000 mg | ORAL_CAPSULE | Freq: Two times a day (BID) | ORAL | Status: AC

## 2011-11-02 NOTE — Progress Notes (Signed)
  Subjective:    Patient ID: Donna Sullivan, female    DOB: 1966/01/11, 45 y.o.   MRN: [id number]  HPI Urinary incontinence- sxs started 3 weeks ago, feels bladder has fallen.  Having leaking w/ coughing.  + dysuria, frequency.  Denies hematuria.  ? Vaginitis- was on abx and prednisone, took Diflucan w/out relief.  Reports vaginal d/c is very heavy.  S/p hysterectomy.  Reports entire vagina is painful.   Review of Systems For ROS see HPI     Objective:   Physical Exam  Vitals reviewed. Constitutional: She appears well-developed and well-nourished. She appears distressed (anxious, upset).  Abdominal: Soft. She exhibits no distension. There is tenderness (no CVA tenderness but + suprapubic tenderness).  Genitourinary: There is no rash or tenderness on the right labia. There is no rash or tenderness on the left labia. There is erythema and tenderness (very tender to speculum insertion, bimanual exam.  vaginal walls erythematous) around the vagina. No bleeding around the vagina. Vaginal discharge (copious watery green D/C) found.          Assessment & Plan:

## 2011-11-02 NOTE — Patient Instructions (Signed)
Take the Keflex as directed for the UTI We'll call you as soon as we have results If the pain worsens- please go to [facility] Call with any questions or concerns HANG IN THERE!!!!

## 2011-11-03 LAB — GC/CHLAMYDIA PROBE AMP, URINE
Chlamydia, Swab/Urine, PCR: NEGATIVE
GC Probe Amp, Urine: NEGATIVE

## 2011-11-05 ENCOUNTER — Telehealth: Payer: Self-pay | Admitting: *Deleted

## 2011-11-05 MED ORDER — METRONIDAZOLE 500 MG PO TABS: ORAL_TABLET | ORAL | Status: AC

## 2011-11-05 NOTE — Telephone Encounter (Signed)
Waiting on lab results- no info to give at this time

## 2011-11-05 NOTE — Telephone Encounter (Signed)
Called pt to advise of positive results from labs for trichomonas. Advised that she will need to start Flagyl 500mg  BID x 7days. Pt understood

## 2011-11-05 NOTE — Telephone Encounter (Signed)
Pt c/o abdominal pain,vaginal pain and itching and some bleeding in urine and urine leakage. Pt notes that symptoms are not changing. Pt notes that she is schedule to see GYN on 11-15-11 Please advise

## 2011-11-06 DIAGNOSIS — N393 Stress incontinence (female) (male): Secondary | ICD-10-CM | POA: Insufficient documentation

## 2011-11-06 DIAGNOSIS — R300 Dysuria: Secondary | ICD-10-CM | POA: Insufficient documentation

## 2011-11-06 NOTE — Assessment & Plan Note (Signed)
Color of discharge and vaginal inflammation consistent w/ STD.  Check wet prep, GC/CT.  Discussed this w/ pt who became understandably upset.  Told her not to jump to conclusions but to await test results.  Pt in agreement.

## 2011-11-06 NOTE — Assessment & Plan Note (Signed)
Pt's UA and suprapubic tenderness consistent w/ UTI.  Start keflex.  Await cx results.

## 2011-11-06 NOTE — Assessment & Plan Note (Signed)
Occuring w/ coughing.  May be due to UTI.  Complete tx and then if still symptomatic will refer to urology.  Pt expressed understanding and is in agreement w/ plan.

## 2012-03-05 ENCOUNTER — Telehealth: Payer: Self-pay | Admitting: Family Medicine

## 2012-03-05 MED ORDER — ALBUTEROL SULFATE HFA 108 (90 BASE) MCG/ACT IN AERS: 2.0000 | INHALATION_SPRAY | Freq: Four times a day (QID) | RESPIRATORY_TRACT | Status: DC | PRN

## 2012-03-05 NOTE — Telephone Encounter (Signed)
Refill: ProAir HFA 62mcg/inh inh aer gm # 8.5. Inhale 2 puffs by mouth 4 times a day as needed.

## 2012-03-05 NOTE — Telephone Encounter (Signed)
rx sent to pharmacy by e-script  

## 2012-04-07 ENCOUNTER — Encounter: Payer: Self-pay | Admitting: Family Medicine

## 2012-04-07 ENCOUNTER — Ambulatory Visit (INDEPENDENT_AMBULATORY_CARE_PROVIDER_SITE_OTHER): Payer: Managed Care, Other (non HMO) | Admitting: Family Medicine

## 2012-04-07 ENCOUNTER — Telehealth: Payer: Self-pay | Admitting: Family Medicine

## 2012-04-07 VITALS — BP 125/85 | HR 86 | Temp 98.1°F | Ht 61.25 in | Wt 199.6 lb

## 2012-04-07 DIAGNOSIS — J019 Acute sinusitis, unspecified: Secondary | ICD-10-CM

## 2012-04-07 MED ORDER — DOXYCYCLINE HYCLATE 100 MG PO TABS: 100.0000 mg | ORAL_TABLET | Freq: Two times a day (BID) | ORAL | Status: AC

## 2012-04-07 NOTE — Telephone Encounter (Signed)
Noted.  Will assess at appt

## 2012-04-07 NOTE — Patient Instructions (Signed)
This is a sinus infection Start the Doxycycline twice daily- take w/ food Drink plenty of fluids REST! Ibuprofen as needed for pain/fever Call with any questions or concerns- particularly if not improving Hang in there!!!

## 2012-04-07 NOTE — Telephone Encounter (Signed)
Caller: Aiden/Patient; PCP: Sheliah Hatch.; CB#: [phone]; Call regarding Muscle Soreness, Decreased Energy, Headaches, Dizziness; Onset est 2 weeks.  Denies pregnancy - s/p hysterectomy.   Advised see in 24 hours per Muscle Pain protocol.  Appt. w/ PCP at 1445  On 04/07/12.

## 2012-04-07 NOTE — Progress Notes (Signed)
  Subjective:    Patient ID: Donna Sullivan, female    DOB: May 16, 1966, 46 y.o.   MRN: [id number]  HPI This is pt's 3rd week of sxs.  + SOB, fatigue, body aches.  Until 2 weeks ago had done 2 months of cross-fit.  'i have to force myself to get up and go'.  + HA, dizziness.  No fevers.  + R sided cervical and axillary lymph discomfort.  Has cut back on smoking.  No N/V.  + diarrhea- but this is not new for pt.   Review of Systems For ROS see HPI     Objective:   Physical Exam  Vitals reviewed. Constitutional: She appears well-developed and well-nourished. No distress.  HENT:  Head: Normocephalic and atraumatic.  Right Ear: Tympanic membrane normal.  Left Ear: Tympanic membrane normal.  Nose: Mucosal edema and rhinorrhea present. Right sinus exhibits maxillary sinus tenderness and frontal sinus tenderness. Left sinus exhibits maxillary sinus tenderness and frontal sinus tenderness.  Mouth/Throat: Uvula is midline and mucous membranes are normal. Posterior oropharyngeal erythema present. No oropharyngeal exudate.  Eyes: Conjunctivae and EOM are normal. Pupils are equal, round, and reactive to light.  Neck: Normal range of motion. Neck supple.  Cardiovascular: Normal rate, regular rhythm and normal heart sounds.   Pulmonary/Chest: Effort normal and breath sounds normal. No respiratory distress. She has no wheezes.  Lymphadenopathy:    She has no cervical adenopathy.          Assessment & Plan:

## 2012-04-07 NOTE — Telephone Encounter (Signed)
Noted pt was seen/treated today during OV by MD Beverely Low

## 2012-04-07 NOTE — Assessment & Plan Note (Signed)
Pt's PE consistent w/ sinus infxn.  This would also account for fatigue, body aches, dizziness, HA.  Start abx.  Reviewed supportive care and red flags that should prompt return.  Pt expressed understanding and is in agreement w/ plan.

## 2012-04-29 ENCOUNTER — Telehealth: Payer: Self-pay | Admitting: Family Medicine

## 2012-04-29 NOTE — Telephone Encounter (Signed)
Pt calling regarding diarrhea that started 04/28/12, states when she coughs and eats she has a BM, watery. Took Immodium x 2 and no relief. Afebrile.Has had 7 loose stools. Emergent s/s for Diarrhea or Other Change in Bowel Habits r/o per protocol except for see in 24 hours due to lasting longer than 24 hrs and not improving with home care. Pt to call in am for appt as office is closed.

## 2012-04-30 NOTE — Telephone Encounter (Signed)
Noted.  Pt should continue w/ immodium.  We have been seeing a lot of this in the community and as long as she's not dehydrated, there is nothing to do but wait for sxs to improve.

## 2012-04-30 NOTE — Telephone Encounter (Signed)
Tried to contact pt to advise MD tabori added instructions, however the number listed has a full mailbox unable to leave message, will call back again

## 2012-05-06 ENCOUNTER — Ambulatory Visit: Payer: Managed Care, Other (non HMO) | Admitting: Family Medicine

## 2012-05-13 ENCOUNTER — Encounter: Payer: Self-pay | Admitting: Gastroenterology

## 2012-05-13 ENCOUNTER — Ambulatory Visit (INDEPENDENT_AMBULATORY_CARE_PROVIDER_SITE_OTHER): Payer: Managed Care, Other (non HMO) | Admitting: Family Medicine

## 2012-05-13 ENCOUNTER — Encounter: Payer: Self-pay | Admitting: Family Medicine

## 2012-05-13 VITALS — BP 135/80 | HR 66 | Temp 97.8°F | Ht 61.5 in | Wt 193.6 lb

## 2012-05-13 DIAGNOSIS — F410 Panic disorder [episodic paroxysmal anxiety] without agoraphobia: Secondary | ICD-10-CM | POA: Insufficient documentation

## 2012-05-13 DIAGNOSIS — R159 Full incontinence of feces: Secondary | ICD-10-CM

## 2012-05-13 DIAGNOSIS — J302 Other seasonal allergic rhinitis: Secondary | ICD-10-CM | POA: Insufficient documentation

## 2012-05-13 DIAGNOSIS — J309 Allergic rhinitis, unspecified: Secondary | ICD-10-CM

## 2012-05-13 DIAGNOSIS — J019 Acute sinusitis, unspecified: Secondary | ICD-10-CM

## 2012-05-13 LAB — CBC WITH DIFFERENTIAL/PLATELET
Basophils Absolute: 0.1 10*3/uL (ref 0.0–0.1)
Hemoglobin: 16 g/dL — ABNORMAL HIGH (ref 12.0–15.0)
Lymphocytes Relative: 29.1 % (ref 12.0–46.0)
Monocytes Relative: 6 % (ref 3.0–12.0)
Neutro Abs: 7.4 10*3/uL (ref 1.4–7.7)
Platelets: 247 10*3/uL (ref 150.0–400.0)
RDW: 12.2 % (ref 11.5–14.6)

## 2012-05-13 LAB — BASIC METABOLIC PANEL
CO2: 20 mEq/L (ref 19–32)
Chloride: 108 mEq/L (ref 96–112)
Potassium: 3.5 mEq/L (ref 3.5–5.1)
Sodium: 138 mEq/L (ref 135–145)

## 2012-05-13 LAB — HEPATIC FUNCTION PANEL
ALT: 43 U/L — ABNORMAL HIGH (ref 0–35)
AST: 49 U/L — ABNORMAL HIGH (ref 0–37)
Alkaline Phosphatase: 77 U/L (ref 39–117)
Bilirubin, Direct: 0.1 mg/dL (ref 0.0–0.3)
Total Protein: 7.7 g/dL (ref 6.0–8.3)

## 2012-05-13 MED ORDER — BUSPIRONE HCL 15 MG PO TABS: ORAL_TABLET | ORAL | Status: DC

## 2012-05-13 MED ORDER — AZITHROMYCIN 250 MG PO TABS: ORAL_TABLET | ORAL | Status: DC

## 2012-05-13 MED ORDER — DIPHENOXYLATE-ATROPINE 2.5-0.025 MG PO TABS: 1.0000 | ORAL_TABLET | Freq: Four times a day (QID) | ORAL | Status: AC | PRN

## 2012-05-13 NOTE — Assessment & Plan Note (Signed)
New.  Pt's anxiety is stemming from irrational fear.  Start Buspar as pt has been intolerant to SSRIs in the past.  List of counselors and contact info provided.  Will follow closely.

## 2012-05-13 NOTE — Progress Notes (Signed)
  Subjective:    Patient ID: Donna Sullivan, female    DOB: January 11, 1966, 46 y.o.   MRN: [id number]  HPI Anxiety- sxs started 'a couple of months ago'.  Only occurs w/ driving.  Now requiring her to pull over, count to 10, and try and calm down.  Is crying, shaking, turning white.  Trying to avoid the highway 'at all costs'.  Trying to take back roads, change her routes, avoid traffic.  Has never had similar sxs.  Has not seen a counselor about this.  Has previously been on Wellbutrin and got mouth blisters.  Zoloft caused sleepiness.  Sinusitis- was seen in April and tx'd w/ Doxy.  Never completed abx course due to fatigue, 'i felt worse'.  Still having facial pain/pressure.  Sore throat.  Bilateral ear pain.  No fever.  Denies bloody drainage, mucous is 'greenish yellow'.  Not currently taking any allergy meds.  Diarrhea- 'uncontrollable'.  Taking immodium daily 'to keep from having an accident'.  When sxs initially started she would have stress fecal incontinence.  Stools described as 'black water'.  Has 'always' had diarrhea but it has never been uncontrollable.  Denies abd pain, N/V.   Review of Systems For ROS see HPI     Objective:   Physical Exam  Vitals reviewed. Constitutional: She appears well-developed and well-nourished. No distress.  HENT:  Head: Normocephalic and atraumatic.  Right Ear: Tympanic membrane normal.  Left Ear: Tympanic membrane normal.  Nose: Mucosal edema and rhinorrhea present. Right sinus exhibits maxillary sinus tenderness and frontal sinus tenderness. Left sinus exhibits maxillary sinus tenderness and frontal sinus tenderness.  Mouth/Throat: Uvula is midline and mucous membranes are normal. Posterior oropharyngeal erythema present. No oropharyngeal exudate.  Eyes: Conjunctivae and EOM are normal. Pupils are equal, round, and reactive to light.  Neck: Normal range of motion. Neck supple.  Cardiovascular: Normal rate, regular rhythm and normal heart sounds.     Pulmonary/Chest: Effort normal and breath sounds normal. No respiratory distress. She has no wheezes.  Abdominal: Soft. Bowel sounds are normal. She exhibits no distension. There is no tenderness. There is no rebound and no guarding.  Lymphadenopathy:    She has no cervical adenopathy.  Psychiatric: She has a normal mood and affect. Her behavior is normal.          Assessment & Plan:

## 2012-05-13 NOTE — Assessment & Plan Note (Signed)
Recurrent.  Pt never completed course of Doxy.  Requesting Zpack as this has worked previously.  Script provided.

## 2012-05-13 NOTE — Patient Instructions (Signed)
Follow up in 1 month to recheck mood Start the Buspar twice daily for the anxiety Call and schedule counseling appts for the panic/anxiety Start the Azithromycin for the sinus infection Add Claritin/Zyrtec daily for the allergy component Use the Lomotil as needed for diarrhea We'll call you with your GI appt Call with any questions or concerns Hang in there!!

## 2012-05-13 NOTE — Assessment & Plan Note (Signed)
New.  Untreated seasonal allergies are likely contributing to pt's ongoing sinus pain/inflammation/infxn.  Start OTC antihistamine.

## 2012-05-13 NOTE — Assessment & Plan Note (Signed)
New.  No obvious cause.  Refer to GI for complete evaluation and tx.  Lomotil in the interim for sxs relief.

## 2012-05-14 LAB — TISSUE TRANSGLUTAMINASE, IGA: Tissue Transglutaminase Ab, IgA: 5.6 U/mL (ref <20)

## 2012-05-14 LAB — GLIADIN ANTIBODIES, SERUM
Gliadin IgA: 9.4 U/mL (ref <20)
Gliadin IgG: 24.2 U/mL — ABNORMAL HIGH (ref <20)

## 2012-05-15 ENCOUNTER — Telehealth: Payer: Self-pay

## 2012-05-15 NOTE — Telephone Encounter (Signed)
Spoke with patient, patient ok'd all information and is already aware of appointment with GI on 06/02/12

## 2012-05-15 NOTE — Telephone Encounter (Signed)
Message copied by Maurice Small on Thu May 15, 2012  4:31 PM ------      Message from: Sheliah Hatch      Created: Wed May 14, 2012  4:14 PM       Based on these labs it is unclear whether pt is gluten intolerant.  Will need to f/u w/ GI as planned.

## 2012-05-16 LAB — RETICULIN ANTIBODIES, IGA W TITER: Reticulin Ab, IgA: NEGATIVE

## 2012-06-02 ENCOUNTER — Telehealth: Payer: Self-pay | Admitting: Gastroenterology

## 2012-06-02 ENCOUNTER — Ambulatory Visit: Payer: Managed Care, Other (non HMO) | Admitting: Gastroenterology

## 2012-06-11 NOTE — Telephone Encounter (Signed)
Pt not billed per yesi frank

## 2012-07-28 ENCOUNTER — Ambulatory Visit: Payer: Managed Care, Other (non HMO) | Admitting: Gastroenterology

## 2012-08-05 ENCOUNTER — Telehealth: Payer: Self-pay | Admitting: Family Medicine

## 2012-08-05 MED ORDER — ALBUTEROL SULFATE HFA 108 (90 BASE) MCG/ACT IN AERS: 2.0000 | INHALATION_SPRAY | Freq: Four times a day (QID) | RESPIRATORY_TRACT | Status: DC | PRN

## 2012-08-05 NOTE — Telephone Encounter (Signed)
Refill: Proair hfa 74mcg/inh #8.5. Inhale 2 puffs into the lungs four times daily as needed. Last fill 05-05-12

## 2012-08-05 NOTE — Telephone Encounter (Signed)
Refill done.  

## 2012-08-14 ENCOUNTER — Telehealth: Payer: Self-pay | Admitting: Family Medicine

## 2012-08-14 NOTE — Telephone Encounter (Signed)
Caller: Laqueta/Patient; Patient Name: Donna Sullivan; PCP: Sheliah Hatch.; Best Callback Phone Number: [phone], Caller states while in shower today she passed  large amount of  thick whitish vaginal discharge . Directly afterwards she had pain to left lower abdomen that caused her to be bent over double. Onset : 1pm today. Denies fever.  Denies nausea or vomiting.  Hx. of Hysterectomy and right ovary removed. Hx. of cyst to left ovary. Current pain level a 4. Treatment:: 1/2 percocet 10/325mg  taken at 2:30pm. All emergent sxs. ruled out per Abdominal Pain with exception " Abdominal pain that has steadily worsened over hours or has been continuous for 3 hours or more and any of the following :loss of appetite, vomiting started after pain, any fever or unable to carry out normal activities. Advised caller should be seen in ED. Caller states she does not want to go to ED due to her Lupus and risk for infection. States she will plan to make an appt. with her GYN tomorrow, 08/16 and will reconsider going to ED if pain worsens.

## 2012-08-27 ENCOUNTER — Ambulatory Visit: Payer: Managed Care, Other (non HMO) | Admitting: Gastroenterology

## 2012-09-04 ENCOUNTER — Encounter (HOSPITAL_COMMUNITY): Payer: Self-pay | Admitting: Pharmacist

## 2012-09-15 ENCOUNTER — Encounter (HOSPITAL_COMMUNITY)
Admission: RE | Admit: 2012-09-15 | Discharge: 2012-09-15 | Disposition: A | Discharge status: Home / Self Care | Payer: Managed Care, Other (non HMO) | Source: Ambulatory Visit | Attending: Obstetrics and Gynecology | Admitting: Obstetrics and Gynecology

## 2012-09-15 ENCOUNTER — Encounter (HOSPITAL_COMMUNITY): Payer: Self-pay

## 2012-09-15 HISTORY — DX: Gastro-esophageal reflux disease without esophagitis: K21.9

## 2012-09-15 HISTORY — DX: Insomnia, unspecified: G47.00

## 2012-09-15 HISTORY — DX: Unspecified asthma, uncomplicated: J45.909

## 2012-09-15 HISTORY — DX: Unspecified convulsions: R56.9

## 2012-09-15 HISTORY — DX: Headache: R51

## 2012-09-15 HISTORY — DX: Chronic kidney disease, unspecified: N18.9

## 2012-09-15 HISTORY — DX: Anxiety disorder, unspecified: F41.9

## 2012-09-15 LAB — CBC
Hemoglobin: 16.3 g/dL — ABNORMAL HIGH (ref 12.0–15.0)
MCH: 30.2 pg (ref 26.0–34.0)
MCHC: 33.2 g/dL (ref 30.0–36.0)
RDW: 13 % (ref 11.5–15.5)

## 2012-09-15 LAB — SURGICAL PCR SCREEN
MRSA, PCR: NEGATIVE
Staphylococcus aureus: NEGATIVE

## 2012-09-15 NOTE — Patient Instructions (Addendum)
YOUR PROCEDURE IS SCHEDULED ON:09/23/12  ENTER THROUGH THE MAIN ENTRANCE OF [facility] AT: 6am  USE DESK PHONE AND DIAL [id number] TO INFORM us OF YOUR ARRIVAL  CALL [phone] IF YOU HAVE ANY QUESTIONS OR PROBLEMS PRIOR TO YOUR ARRIVAL.  REMEMBER: DO NOT EAT OR DRINK AFTER MIDNIGHT Monday   SPECIAL INSTRUCTIONS: FOLLOW [employer]IB[employer] GUIDELINES   YOU MAY BRUSH YOUR TEETH THE MORNING OF SURGERY   TAKE THESE MEDICINES THE DAY OF SURGERY WITH SIP OF WATER:  BRING YOUR INHALER TO HOSPITAL ON THE DAY OF SURGERY   DO NOT WEAR JEWELRY, EYE MAKEUP, LIPSTICK OR DARK FINGERNAIL POLISH DO NOT WEAR LOTIONS  DO NOT SHAVE FOR 48 HOURS PRIOR TO SURGERY   YOU WILL NOT BE ALLOWED TO DRIVE YOURSELF HOME.

## 2012-09-23 ENCOUNTER — Other Ambulatory Visit: Payer: Self-pay

## 2012-09-23 ENCOUNTER — Ambulatory Visit (HOSPITAL_COMMUNITY)
Admission: RE | Admit: 2012-09-23 | Discharge: 2012-09-23 | Disposition: A | Discharge status: Home / Self Care | Payer: Managed Care, Other (non HMO) | Source: Ambulatory Visit | Attending: Obstetrics and Gynecology | Admitting: Obstetrics and Gynecology

## 2012-09-23 ENCOUNTER — Ambulatory Visit (HOSPITAL_COMMUNITY): Payer: Managed Care, Other (non HMO) | Admitting: Anesthesiology

## 2012-09-23 ENCOUNTER — Encounter (HOSPITAL_COMMUNITY)
Admission: RE | Disposition: A | Discharge status: Home / Self Care | Payer: Self-pay | Source: Ambulatory Visit | Attending: Obstetrics and Gynecology

## 2012-09-23 ENCOUNTER — Encounter (HOSPITAL_COMMUNITY): Payer: Self-pay | Admitting: Anesthesiology

## 2012-09-23 DIAGNOSIS — R102 Pelvic and perineal pain: Secondary | ICD-10-CM | POA: Diagnosis present

## 2012-09-23 DIAGNOSIS — N736 Female pelvic peritoneal adhesions (postinfective): Secondary | ICD-10-CM | POA: Insufficient documentation

## 2012-09-23 DIAGNOSIS — Z01812 Encounter for preprocedural laboratory examination: Secondary | ICD-10-CM | POA: Insufficient documentation

## 2012-09-23 DIAGNOSIS — N949 Unspecified condition associated with female genital organs and menstrual cycle: Secondary | ICD-10-CM | POA: Insufficient documentation

## 2012-09-23 DIAGNOSIS — Z01818 Encounter for other preprocedural examination: Secondary | ICD-10-CM | POA: Insufficient documentation

## 2012-09-23 HISTORY — PX: LAPAROSCOPY: SHX197

## 2012-09-23 SURGERY — LAPAROSCOPY OPERATIVE
Anesthesia: General | Site: Abdomen | Wound class: Clean Contaminated

## 2012-09-23 MED ORDER — PROPOFOL 10 MG/ML IV EMUL
INTRAVENOUS | Status: DC | PRN
  Administered 2012-09-23: 50 mg via INTRAVENOUS
  Administered 2012-09-23: 150 mg via INTRAVENOUS

## 2012-09-23 MED ORDER — OXYCODONE-ACETAMINOPHEN 5-325 MG PO TABS: 1.0000 | ORAL_TABLET | ORAL | Status: DC | PRN

## 2012-09-23 MED ORDER — GLYCOPYRROLATE 0.2 MG/ML IJ SOLN
INTRAMUSCULAR | Status: DC | PRN
  Administered 2012-09-23: .6 mg via INTRAVENOUS
  Administered 2012-09-23: 0.1 mg via INTRAVENOUS

## 2012-09-23 MED ORDER — ROCURONIUM BROMIDE 100 MG/10ML IV SOLN
INTRAVENOUS | Status: DC | PRN
  Administered 2012-09-23: 25 mg via INTRAVENOUS
  Administered 2012-09-23: 5 mg via INTRAVENOUS
  Administered 2012-09-23: 10 mg via INTRAVENOUS

## 2012-09-23 MED ORDER — LACTATED RINGERS IR SOLN
Status: DC | PRN
  Administered 2012-09-23: 3000 mL

## 2012-09-23 MED ORDER — LIDOCAINE HCL (CARDIAC) 20 MG/ML IV SOLN
INTRAVENOUS | Status: AC
  Filled 2012-09-23: qty 5

## 2012-09-23 MED ORDER — SODIUM CHLORIDE 0.9 % IJ SOLN
INTRAMUSCULAR | Status: DC | PRN
  Administered 2012-09-23: 7 mL

## 2012-09-23 MED ORDER — PROPOFOL 10 MG/ML IV EMUL
INTRAVENOUS | Status: AC
  Filled 2012-09-23: qty 20

## 2012-09-23 MED ORDER — DEXAMETHASONE SODIUM PHOSPHATE 10 MG/ML IJ SOLN
INTRAMUSCULAR | Status: AC
  Filled 2012-09-23: qty 1

## 2012-09-23 MED ORDER — LIDOCAINE HCL (CARDIAC) 20 MG/ML IV SOLN
INTRAVENOUS | Status: DC | PRN
  Administered 2012-09-23: 20 mg via INTRAVENOUS
  Administered 2012-09-23: 80 mg via INTRAVENOUS

## 2012-09-23 MED ORDER — ONDANSETRON HCL 4 MG PO TABS
4.0000 mg | ORAL_TABLET | Freq: Once | ORAL | Status: AC
  Administered 2012-09-23: 4 mg via ORAL

## 2012-09-23 MED ORDER — ONDANSETRON HCL 4 MG PO TABS
ORAL_TABLET | ORAL | Status: AC
  Filled 2012-09-23: qty 1

## 2012-09-23 MED ORDER — NEOSTIGMINE METHYLSULFATE 1 MG/ML IJ SOLN
INTRAMUSCULAR | Status: AC
  Filled 2012-09-23: qty 10

## 2012-09-23 MED ORDER — ALBUTEROL SULFATE HFA 108 (90 BASE) MCG/ACT IN AERS
INHALATION_SPRAY | RESPIRATORY_TRACT | Status: AC
  Filled 2012-09-23: qty 6.7

## 2012-09-23 MED ORDER — ROCURONIUM BROMIDE 50 MG/5ML IV SOLN
INTRAVENOUS | Status: AC
  Filled 2012-09-23: qty 1

## 2012-09-23 MED ORDER — ONDANSETRON HCL 4 MG/2ML IJ SOLN
INTRAMUSCULAR | Status: AC
  Filled 2012-09-23: qty 2

## 2012-09-23 MED ORDER — GLYCOPYRROLATE 0.2 MG/ML IJ SOLN
INTRAMUSCULAR | Status: AC
  Filled 2012-09-23: qty 1

## 2012-09-23 MED ORDER — NEOSTIGMINE METHYLSULFATE 1 MG/ML IJ SOLN
INTRAMUSCULAR | Status: DC | PRN
  Administered 2012-09-23: 3 mg via INTRAVENOUS

## 2012-09-23 MED ORDER — HYDROMORPHONE HCL PF 1 MG/ML IJ SOLN
0.2500 mg | INTRAMUSCULAR | Status: DC | PRN
  Administered 2012-09-23 (×4): 0.5 mg via INTRAVENOUS

## 2012-09-23 MED ORDER — FENTANYL CITRATE 0.05 MG/ML IJ SOLN
INTRAMUSCULAR | Status: DC | PRN
  Administered 2012-09-23 (×2): 50 ug via INTRAVENOUS
  Administered 2012-09-23: 100 ug via INTRAVENOUS
  Administered 2012-09-23: 50 ug via INTRAVENOUS

## 2012-09-23 MED ORDER — BUPIVACAINE HCL (PF) 0.25 % IJ SOLN
INTRAMUSCULAR | Status: DC | PRN
  Administered 2012-09-23: 10 mL

## 2012-09-23 MED ORDER — MIDAZOLAM HCL 5 MG/5ML IJ SOLN
INTRAMUSCULAR | Status: DC | PRN
  Administered 2012-09-23: 1 mg via INTRAVENOUS

## 2012-09-23 MED ORDER — ONDANSETRON HCL 4 MG/2ML IJ SOLN
INTRAMUSCULAR | Status: DC | PRN
  Administered 2012-09-23: 4 mg via INTRAVENOUS

## 2012-09-23 MED ORDER — BUPIVACAINE HCL (PF) 0.25 % IJ SOLN
INTRAMUSCULAR | Status: AC
  Filled 2012-09-23: qty 30

## 2012-09-23 MED ORDER — MIDAZOLAM HCL 2 MG/2ML IJ SOLN
INTRAMUSCULAR | Status: AC
  Filled 2012-09-23: qty 2

## 2012-09-23 MED ORDER — FENTANYL CITRATE 0.05 MG/ML IJ SOLN
INTRAMUSCULAR | Status: AC
  Filled 2012-09-23: qty 5

## 2012-09-23 MED ORDER — LACTATED RINGERS IV SOLN
INTRAVENOUS | Status: DC
  Administered 2012-09-23 (×3): via INTRAVENOUS
  Administered 2012-09-23: 75 mL/h via INTRAVENOUS

## 2012-09-23 MED ORDER — HYDROMORPHONE HCL PF 1 MG/ML IJ SOLN
INTRAMUSCULAR | Status: AC
  Filled 2012-09-23: qty 1

## 2012-09-23 MED ORDER — HYDROMORPHONE HCL PF 1 MG/ML IJ SOLN
INTRAMUSCULAR | Status: AC
  Administered 2012-09-23: 0.5 mg via INTRAVENOUS
  Filled 2012-09-23: qty 1

## 2012-09-23 SURGICAL SUPPLY — 36 items
ADH SKN CLS APL DERMABOND .7 (GAUZE/BANDAGES/DRESSINGS) ×1
BAG SPEC RTRVL LRG 6X4 10 (ENDOMECHANICALS)
CABLE HIGH FREQUENCY MONO STRZ (ELECTRODE) IMPLANT
CATH ROBINSON RED A/P 16FR (CATHETERS) ×1 IMPLANT
CHLORAPREP W/TINT 26ML (MISCELLANEOUS) ×2 IMPLANT
CLOTH BEACON ORANGE TIMEOUT ST (SAFETY) ×2 IMPLANT
DECANTER SPIKE VIAL GLASS SM (MISCELLANEOUS) ×3 IMPLANT
DERMABOND ADVANCED (GAUZE/BANDAGES/DRESSINGS) ×1
DERMABOND ADVANCED .7 DNX12 (GAUZE/BANDAGES/DRESSINGS) ×1 IMPLANT
GLOVE BIO SURGEON STRL SZ8 (GLOVE) ×2 IMPLANT
GLOVE BIOGEL PI IND STRL 7.0 (GLOVE) IMPLANT
GLOVE BIOGEL PI INDICATOR 7.0 (GLOVE) ×3
GLOVE ECLIPSE 6.5 STRL STRAW (GLOVE) ×3 IMPLANT
GLOVE ORTHO TXT STRL SZ7.5 (GLOVE) ×2 IMPLANT
GOWN PREVENTION PLUS LG XLONG (DISPOSABLE) ×6 IMPLANT
H R LUBE JELLY XXX (MISCELLANEOUS) ×1 IMPLANT
NDL EPID 17G 5 ECHO TUOHY (NEEDLE) IMPLANT
NDL INSUFFLATION 14GA 120MM (NEEDLE) ×1 IMPLANT
NEEDLE EPID 17G 5 ECHO TUOHY (NEEDLE) IMPLANT
NEEDLE INSUFFLATION 14GA 120MM (NEEDLE) ×2 IMPLANT
NS IRRIG 1000ML POUR BTL (IV SOLUTION) ×2 IMPLANT
PACK LAPAROSCOPY BASIN (CUSTOM PROCEDURE TRAY) ×2 IMPLANT
POUCH SPECIMEN RETRIEVAL 10MM (ENDOMECHANICALS) IMPLANT
PROTECTOR NERVE ULNAR (MISCELLANEOUS) ×2 IMPLANT
SCALPEL HARMONIC ACE (MISCELLANEOUS) IMPLANT
SET IRRIG TUBING LAPAROSCOPIC (IRRIGATION / IRRIGATOR) IMPLANT
SLEEVE Z-THREAD 5X100MM (TROCAR) ×1 IMPLANT
SOLUTION ELECTROLUBE (MISCELLANEOUS) IMPLANT
SUT VICRYL 0 UR6 27IN ABS (SUTURE) IMPLANT
SUT VICRYL 4-0 PS2 18IN ABS (SUTURE) ×2 IMPLANT
TOWEL OR 17X24 6PK STRL BLUE (TOWEL DISPOSABLE) ×5 IMPLANT
TRAY FOLEY CATH 14FR (SET/KITS/TRAYS/PACK) IMPLANT
TROCAR Z-THREAD FIOS 11X100 BL (TROCAR) IMPLANT
TROCAR Z-THREAD FIOS 5X100MM (TROCAR) ×3 IMPLANT
WARMER LAPAROSCOPE (MISCELLANEOUS) ×2 IMPLANT
WATER STERILE IRR 1000ML POUR (IV SOLUTION) ×2 IMPLANT

## 2012-09-23 NOTE — Anesthesia Preprocedure Evaluation (Addendum)
Anesthesia Evaluation  Patient identified by MRN, date of birth, ID band Patient awake    Reviewed: Allergy & Precautions, H&P , NPO status , Patient's Chart, lab work & pertinent test results, reviewed documented beta blocker date and time   History of Anesthesia Complications Negative for: history of anesthetic complications  Airway       Dental  (+) Teeth Intact   Pulmonary asthma (uses rescue inhaler 1-2 x/day, no hospitalizations) , Current Smoker,  breath sounds clear to auscultation Patient coughs a lot after pulm exam, but no wheezing noted. Pulmonary exam normal       Cardiovascular Exercise Tolerance: Good Rhythm:regular Rate:Normal     Neuro/Psych  Headaches (weekly), PSYCHIATRIC DISORDERS (anxiety)    GI/Hepatic Neg liver ROS, GERD- (no meds)  ,  Endo/Other  negative endocrine ROS  Renal/GU Renal disease (had surgery at 46 yo, no kidney problems since)  Female GU complaint (pelvic pain, on nasal stadol)     Musculoskeletal   Abdominal   Peds  Hematology negative hematology ROS (+)   Anesthesia Other Findings EKG NSR, no recent Hx of Steroids.  Reproductive/Obstetrics negative OB ROS                         Anesthesia Physical Anesthesia Plan  ASA: III  Anesthesia Plan: General ETT   Post-op Pain Management:    Induction:   Airway Management Planned:   Additional Equipment:   Intra-op Plan:   Post-operative Plan:   Informed Consent: I have reviewed the patients History and Physical, chart, labs and discussed the procedure including the risks, benefits and alternatives for the proposed anesthesia with the patient or authorized representative who has indicated his/her understanding and acceptance.   Dental Advisory Given  Plan Discussed with: CRNA and Surgeon  Anesthesia Plan Comments:         Anesthesia Quick Evaluation

## 2012-09-23 NOTE — Interval H&P Note (Signed)
History and Physical Interval Note:  09/23/2012 7:09 AM  Donna Sullivan  has presented today for surgery, with the diagnosis of pelvic pain  The various methods of treatment have been discussed with the patient and family. After consideration of risks, benefits and other options for treatment, the patient has consented to  Procedure(s) (LRB) with comments: LAPAROSCOPY OPERATIVE (N/A) as a surgical intervention .  The patient's history has been reviewed, patient examined, no change in status, stable for surgery.  I have reviewed the patient's chart and labs.  Questions were answered to the patient's satisfaction.     [name]

## 2012-09-23 NOTE — Transfer of Care (Signed)
Immediate Anesthesia Transfer of Care Note  Patient: Donna Sullivan  Procedure(s) Performed: Procedure(s) (LRB) with comments: LAPAROSCOPY OPERATIVE (N/A) LAPAROSCOPIC LYSIS OF ADHESIONS (N/A)  Patient Location: PACU  Anesthesia Type: General  Level of Consciousness: awake, oriented and patient cooperative  Airway & Oxygen Therapy: Patient Spontanous Breathing and Patient connected to face mask oxygen  Post-op Assessment: Report given to PACU RN and Post -op Vital signs reviewed and stable  Post vital signs: Reviewed and stable  Complications: No apparent anesthesia complications

## 2012-09-23 NOTE — Anesthesia Postprocedure Evaluation (Signed)
  Anesthesia Post-op Note  Patient: Donna Sullivan  Procedure(s) Performed: Procedure(s) (LRB) with comments: LAPAROSCOPY OPERATIVE (N/A) LAPAROSCOPIC LYSIS OF ADHESIONS (N/A) Patient is awake and responsive. Pain and nausea are reasonably well controlled. Vital signs are stable and clinically acceptable. Oxygen saturation is clinically acceptable. There are no apparent anesthetic complications at this time. Patient is ready for discharge.

## 2012-09-23 NOTE — Preoperative (Signed)
Beta Blockers   Reason not to administer Beta Blockers:Not Applicable 

## 2012-09-23 NOTE — H&P (Signed)
Donna Sullivan is an 46 y.o. female. She had her annual exam a month ago and c/o several week h/o daily pelvic pain with intermittent exacerbations, no specific aggravating or relieving factors.  She has a previous TAH/RSO for pelvic mass.  Pelvic u/s after her annual exam was essentially normal with a 3 cm cyst on left ovary.  Her pain is bad enough that she would like laparoscopy to try to find and remove the cause.  Pertinent Gynecological History: OB History: G3, P1021   Menstrual History: No LMP recorded. Patient has had a hysterectomy.    Past Medical History  Diagnosis Date  . Discoid lupus     Dr Yetta Barre, dermatologist  . Anxiety   . Asthma   . GERD (gastroesophageal reflux disease)   . Chronic kidney disease 1985    BLOCKED TUBE IN KIDNEY  . Seizures     EPILEPSY- NO SEIZURES SINCE AGE 56  . Headache     MIGRAINES  . Insomnia     Past Surgical History  Procedure Date  . Appendectomy   . Kidney surgery     right, straightened tube  . Right oophorectomy   . Ankle reconstruction 04/2009  . Other surgical history     Hysterectomy with 10cm grown removal, Dr. Jackelyn Knife  . Abdominal hysterectomy   . Cesarean section   . Cholecystectomy   . Diagnostic laparoscopy     Family History  Problem Relation Age of Onset  . Coronary artery disease Mother   . Hypertension Mother   . Hypertension Father   . Colon cancer      grandfather    Social History:  reports that she has been smoking Cigarettes.  She has been smoking about .5 packs per day. She has never used smokeless tobacco. She reports that she drinks alcohol. She reports that she does not use illicit drugs.  Allergies:  Allergies  Allergen Reactions  . Amoxicillin-Pot Clavulanate     REACTION: dizziness, fatigue/drowsiness and nausea  . Ciprofloxacin Hcl     Blisters in mouth  . Doxycycline     Fatigue, sleepy, worse     Prescriptions prior to admission  Medication Sig Dispense Refill  . busPIRone  (BUSPAR) 15 MG tablet 1/2 tab twice daily x1 week and then increase to 1 tab twice daily  60 tablet  3  . butorphanol (STADOL) 10 MG/ML nasal spray Place 1 spray into the nose every 4 (four) hours as needed. For pain      . ibuprofen (ADVIL,MOTRIN) 800 MG tablet Take 800 mg by mouth 2 (two) times daily.      Marland Kitchen albuterol (PROAIR HFA) 108 (90 BASE) MCG/ACT inhaler Inhale 2 puffs into the lungs 4 (four) times daily as needed.  18 g  1  . butalbital-acetaminophen-caffeine (FIORICET, ESGIC) 50-325-40 MG per tablet Take 1 tablet by mouth 2 (two) times daily as needed. headache         Review of Systems  Respiratory: Negative.   Cardiovascular: Negative.   Gastrointestinal: Negative.   Genitourinary: Negative.     Blood pressure 114/79, pulse 63, temperature 98.1 F (36.7 C), temperature source Oral, resp. rate 16, SpO2 98.00%. Physical Exam  Constitutional: She appears well-developed and well-nourished.  Neck: Neck supple. No thyromegaly present.  Cardiovascular: Normal rate, regular rhythm and normal heart sounds.   No murmur heard. Respiratory: Effort normal and breath sounds normal. No respiratory distress. She has no wheezes.  GI: Soft. She exhibits no distension and no  mass. There is Tenderness: slightly tender on left.. There is no guarding.       Vertical and transverse scars  Genitourinary: Vagina normal.       No adnexal mass, bilateral adnexal tenderness, L>R    No results found for this or any previous visit (from the past 24 hour(s)).  No results found.  Assessment/Plan: Pelvic pain, previous TAH/RSO, possible adhesions.  All medical and surgical options have been discussed, as well as risks of surgery and chances of relieving her pain.  She wants to retain her remaining left ovary if at all possible.  Will admit for diagnostic/possible operative laparoscopy.    Jayon Matton D 09/23/2012, 6:57 AM

## 2012-09-23 NOTE — Op Note (Signed)
Preoperative diagnosis: Pelvic pain Postoperative diagnosis: Pelvic pain and pelvic adhesions Procedure: Laparoscopy with extensive lysis of adhesions Surgeon: Lavina Hamman M.D. Anesthesia: Gen. Endotracheal tube Findings: She had omentum adherent to the anterior abdominal wall beneath the umbilicus. Uterus and right ovary were surgically absent. There were significant adhesions of the omentum and bowel to the pelvic brim and covering the left ovary. Once the left tube and ovary were exposed they appeared fairly normal. Estimated blood loss: 50 cc Complications: None Specimens: None  Procedure in detail:  Patient was taken to the operating room placed in the dorsosupine position. Her left arm was tucked to her side and legs were placed in mobile stirrups. Abdomen perineum and vagina were then prepped and draped in usual sterile fashion and bladder drained with a red Robinson catheter. Infraumbilical skin was infiltrated with quarter percent Marcaine and a 1 cm vertical incision was made. A veress needle was inserted in the peritoneal cavity and placement confirmed by the water drop test and an opening pressure of 9 mm mercury. CO2 was insufflated to a pressure of 14 mm mercury and a veress needle was removed. A 5 mm trocar was then introduced with direct visualization with laparoscope. A 5 mm ports were then placed bilaterally also a direct visualization. The above-mentioned findings were noticed. The omental adhesions to the anterior abdominal wall beneath the umbilicus were left intact. Very carefully using blunt dissection and cold scissors I was able to dissect out most of the adhesions away from the abdominal wall and the pelvic brim. I was able to dissect most of the adhesions from around left tube and ovary confirming that the left tube and ovary otherwise appeared normal. Bleeding around left ovary was controlled controlled with bipolar cautery. No further source for her pain was identified.  All sites were copiously irrigated. There was no obvious injury to the bowel. I was never near the ureters. All sites appeared hemostatic and there were no further significant pelvic adhesions. The 5 mm trochars were removed with direct visualization with the laparoscope and all gas was allowed to deflate deflate from the abdomen. The umbilical trocar was then removed. Skin incisions were closed with interrupted subcuticular sutures of 4-0 Vicryl followed by Dermabond. The patient tolerated the procedure well and was awakened and taken to the recovery in stable condition. Counts were correct and she had PAS hose on throughout the procedure.

## 2012-09-24 ENCOUNTER — Encounter (HOSPITAL_COMMUNITY): Payer: Self-pay | Admitting: Obstetrics and Gynecology

## 2012-09-29 ENCOUNTER — Ambulatory Visit: Payer: Managed Care, Other (non HMO) | Admitting: Gastroenterology

## 2012-11-14 ENCOUNTER — Telehealth: Payer: Self-pay | Admitting: Family Medicine

## 2012-11-14 ENCOUNTER — Encounter: Payer: Self-pay | Admitting: Family Medicine

## 2012-11-14 ENCOUNTER — Ambulatory Visit (INDEPENDENT_AMBULATORY_CARE_PROVIDER_SITE_OTHER): Payer: Managed Care, Other (non HMO) | Admitting: Family Medicine

## 2012-11-14 VITALS — BP 110/80 | HR 71 | Temp 97.9°F | Wt 203.0 lb

## 2012-11-14 DIAGNOSIS — L659 Nonscarring hair loss, unspecified: Secondary | ICD-10-CM

## 2012-11-14 DIAGNOSIS — J019 Acute sinusitis, unspecified: Secondary | ICD-10-CM

## 2012-11-14 DIAGNOSIS — M778 Other enthesopathies, not elsewhere classified: Secondary | ICD-10-CM

## 2012-11-14 DIAGNOSIS — L930 Discoid lupus erythematosus: Secondary | ICD-10-CM

## 2012-11-14 DIAGNOSIS — M65839 Other synovitis and tenosynovitis, unspecified forearm: Secondary | ICD-10-CM

## 2012-11-14 DIAGNOSIS — F32A Depression, unspecified: Secondary | ICD-10-CM

## 2012-11-14 DIAGNOSIS — F329 Major depressive disorder, single episode, unspecified: Secondary | ICD-10-CM

## 2012-11-14 LAB — TSH: TSH: 0.32 u[IU]/mL — ABNORMAL LOW (ref 0.35–5.50)

## 2012-11-14 MED ORDER — HYDROCOD POLST-CHLORPHEN POLST 10-8 MG/5ML PO LQCR: 5.0000 mL | Freq: Two times a day (BID) | ORAL | Status: DC

## 2012-11-14 MED ORDER — LORAZEPAM 0.5 MG PO TABS: 0.5000 mg | ORAL_TABLET | Freq: Two times a day (BID) | ORAL | Status: DC | PRN

## 2012-11-14 MED ORDER — BUPROPION HCL ER (XL) 150 MG PO TB24: 150.0000 mg | ORAL_TABLET | Freq: Every day | ORAL | Status: DC

## 2012-11-14 MED ORDER — SULFAMETHOXAZOLE-TMP DS 800-160 MG PO TABS: 1.0000 | ORAL_TABLET | Freq: Two times a day (BID) | ORAL | Status: DC

## 2012-11-14 MED ORDER — GUAIFENESIN-CODEINE 100-10 MG/5ML PO SYRP: 10.0000 mL | ORAL_SOLUTION | Freq: Three times a day (TID) | ORAL | Status: DC | PRN

## 2012-11-14 NOTE — Assessment & Plan Note (Signed)
New to provider.  No obviously bald areas on scalp but this is very distressing for pt.  Discussed that this could be stress related.  Check thyroid.  Refer to derm and rheum due to ongoing issues w/ lupus.  Pt expressed understanding and is in agreement w/ plan.

## 2012-11-14 NOTE — Assessment & Plan Note (Signed)
New.  Continue prescription NSAIDs, ice, brace prn.  Reviewed supportive care and red flags that should prompt return.  Pt expressed understanding and is in agreement w/ plan.

## 2012-11-14 NOTE — Assessment & Plan Note (Signed)
Pt's sxs and PE consistent w/ infxn.  Start abx.  Cough syrup prn.  Reviewed supportive care and red flags that should prompt return.  Pt expressed understanding and is in agreement w/ plan.  

## 2012-11-14 NOTE — Progress Notes (Signed)
  Subjective:    Patient ID: Donna Sullivan, female    DOB: November 09, 1966, 46 y.o.   MRN: [id number]  HPI Hair loss- sxs started 1 month ago.  Reports 'it's coming out in handfuls'.  Now reports bald spots on scalp.  Hx of lupus.  Increased stress recently.  Breaking out in 'lupus bumps' on skin.  Doesn't have rheum.    Depression- daughter has moved out, now caregiver for father, husband cheated on her.  House is bordering on foreclosure.  Tearful, overwhelmed.  Pt reports she has been on celexa, wellbutrin, zoloft, paxil- 'i can't take any of those'.  Not sleeping, fears becoming addicted to xanax or klonopin 'like my friends'.  R wrist pain- sxs started a 'few days ago'.  Intermittent pain.  Pain w/ wrist extension, not w/ flexion.  Will swell and then resolve spontaneously.  Wearing wrist brace to prevent extension.  Pain improves w/ Ibuprofen 800mg .  URI- sxs started 1 week ago, + chest congestion, productive cough.  + HA.  Sinus pain/pressure.  Bilateral ear fullness.  No fevers.  + sick contacts.   Review of Systems For ROS see HPI     Objective:   Physical Exam  Vitals reviewed. Constitutional: She appears well-developed and well-nourished. She appears distressed (tearful, anxious).  HENT:  Head: Normocephalic and atraumatic.  Right Ear: Tympanic membrane normal.  Left Ear: Tympanic membrane normal.  Nose: Mucosal edema and rhinorrhea present. Right sinus exhibits maxillary sinus tenderness and frontal sinus tenderness. Left sinus exhibits maxillary sinus tenderness and frontal sinus tenderness.  Mouth/Throat: Uvula is midline and mucous membranes are normal. Posterior oropharyngeal erythema present. No oropharyngeal exudate.  Eyes: Conjunctivae normal and EOM are normal. Pupils are equal, round, and reactive to light.  Neck: Normal range of motion. Neck supple.  Cardiovascular: Normal rate, regular rhythm and normal heart sounds.   Pulmonary/Chest: Effort normal and breath sounds  normal. No respiratory distress. She has no wheezes.       + hacking cough  Musculoskeletal:       R wrist in wrap brace to limit extension.  No bony tenderness.  + tenderness w/ extension>flexion  Lymphadenopathy:    She has no cervical adenopathy.  Skin: Skin is warm and dry.  Psychiatric:       Tearful, anxious          Assessment & Plan:

## 2012-11-14 NOTE — Telephone Encounter (Signed)
Per Dr Beverely Low ok to change to tussionex 5 ml bid disp #120 no refill. .Rx sent

## 2012-11-14 NOTE — Patient Instructions (Signed)
Start the Bactrim twice daily for the sinus infection Add Mucinex to thin your congestion Drink plenty of fluids Codeine cough syrup as needed Ibuprofen and ice as needed for wrist pain We'll notify you of your lab results Someone will call you with your rheumatology appt START the Wellbutrin daily for anxiety/depression Call with any questions or concerns Hang in there!!

## 2012-11-14 NOTE — Assessment & Plan Note (Signed)
New to provider, chronic for pt.  Deteriorated.  Pt now w/ moderate-severe sxs.  Able to contract for safety, denies SI/HI.  Unable to take SSRIs per her report due to previous side effects.  Unable to afford psych or counseling at this time.  Will start low dose Ativan for sleep- pt refused Klonopin.  Will follow.

## 2012-11-14 NOTE — Assessment & Plan Note (Signed)
Pt has been following w/ derm but not rheum.  Is now having worsening problems due to increased stress.  Rheum referral made.

## 2012-11-14 NOTE — Telephone Encounter (Signed)
Pt states the cough medicine we prescribed her is not covered by her insurance. She would like tussionex called into [facility]

## 2012-11-17 ENCOUNTER — Other Ambulatory Visit (INDEPENDENT_AMBULATORY_CARE_PROVIDER_SITE_OTHER): Payer: Managed Care, Other (non HMO)

## 2012-11-17 ENCOUNTER — Telehealth: Payer: Self-pay | Admitting: Family Medicine

## 2012-11-17 DIAGNOSIS — R7989 Other specified abnormal findings of blood chemistry: Secondary | ICD-10-CM

## 2012-11-17 DIAGNOSIS — R6889 Other general symptoms and signs: Secondary | ICD-10-CM

## 2012-11-17 LAB — T3, FREE: T3, Free: 2.9 pg/mL (ref 2.3–4.2)

## 2012-11-17 LAB — TSH: TSH: 0.87 u[IU]/mL (ref 0.35–5.50)

## 2012-11-17 LAB — T4, FREE: Free T4: 0.69 ng/dL (ref 0.60–1.60)

## 2012-11-17 NOTE — Telephone Encounter (Signed)
Thyroid levels are all normal- it is common to have variation.  This is good news If the Bactrim is causing mouth blisters- she can switch to Biaxin XL 500mg , 2 tabs at the same time daily, w/ food, #20, no refills

## 2012-11-17 NOTE — Telephone Encounter (Signed)
Pt called wanting her lab results. She also states she thinks the antibiotic Bactrim is causing her to have mouth sores. Please call pt back at [phone]

## 2012-11-17 NOTE — Telephone Encounter (Signed)
Left message to call office

## 2012-11-18 ENCOUNTER — Telehealth: Payer: Self-pay | Admitting: Family Medicine

## 2012-11-18 NOTE — Telephone Encounter (Signed)
[facility] Triage Call Report Triage Record Num: [id number] Operator: Caswell Corwin Patient Name: Donna Sullivan Call Date & Time: 11/17/2012 5:46:29PM Patient Phone: [phone] PCP: Lezlie Octave Patient Gender: Female PCP Fax : [phone] Patient DOB: 25-Jan-1966 Practice Name: [facility] Reason for Call: Caller: [name]/Patient; PCP: Sheliah Hatch.; CB#: [phone]; Call regarding Antibiotic is causing blisters on her tongue. Pt was seen 11/14/12 by Dr. Beverely Low and diagnosed with URI and sinus infection. Started on Bactrim 1 PO BID (and Tussinex and ProAir inhaler) and started causing her tongue to blister 11/16/12. No other nouth ulcers. She has taken none today. She called the office earlier and then got a call back at 1705 and missed it. Per EPIC chart message states that is the Bactrim is causing mouth ulcers then she may try Biaxin XL 500 2 tabs at the same time daily with food # 20 with no refills. Called into [facility] at [phone] and spoke with Lanora Manis. Since pt has not taken any Bactrim today she may start new medication this evening. Instrucited to call office 11/18/12 for F/U lab info and says she will ask for "Magic Mouthwash". Denies needing triage for blisters on tongue. Call back instrucions given. Protocol(s) Used: Medication Questions - Adult Recommended Outcome per Protocol: Call Provider within 4 Hours Reason for Outcome: Prescription ordered today and not available at pharmacy putting patient at clinical risk Care Advice: ~

## 2012-11-20 ENCOUNTER — Telehealth: Payer: Self-pay | Admitting: Family Medicine

## 2012-11-20 NOTE — Telephone Encounter (Signed)
[facility] Triage Call Report Triage Record Num: [id number] Operator: Rebeca Allegra Patient Name: Donna Sullivan Call Date & Time: 11/19/2012 5:01:39PM Patient Phone: [phone] PCP: Lezlie Octave Patient Gender: Female PCP Fax : [phone] Patient DOB: 1966/10/23 Practice Name: [facility] Reason for Call: Caller: [name]/Patient; PCP: Sheliah Hatch.; CB#: [phone]; Call regarding Lab Results; Thyroid. RN verified via EPIC, a note written by Dr. Beverely Low 11/17/12 reads: "Thyroid levels are all normal- it is common to have variation. This is good news". Message relayed to pt who verbalized understanding. Protocol(s) Used: Office Note Recommended Outcome per Protocol: Information Noted and Sent to Office Reason for Outcome: Caller information to office Care Advice: ~ 11/

## 2012-11-20 NOTE — Telephone Encounter (Signed)
Did you receive this?

## 2012-11-24 ENCOUNTER — Telehealth: Payer: Self-pay | Admitting: Family Medicine

## 2012-11-24 NOTE — Telephone Encounter (Signed)
Patient left msg on triage line at 9:18a stating she is not feeling any better and is feeling a little worse than she did at her visit. She would like to know what she should do. Cb# [phone]

## 2012-11-25 NOTE — Telephone Encounter (Signed)
Patient left msg on triage line at 9:18a 11/24/12 stating she is not feeling any better and is feeling a little worse than she did at her visit 11/14/12. She would like to know what she should do. PLz advise   MW  Cb# [phone]

## 2012-11-25 NOTE — Telephone Encounter (Signed)
Needs to be a little more specific about what is feeling worse in order for [place] to better treat her symptoms

## 2012-11-25 NOTE — Telephone Encounter (Signed)
Left message to call office

## 2012-11-26 NOTE — Telephone Encounter (Signed)
Left message to call office

## 2012-12-04 NOTE — Telephone Encounter (Signed)
This apparently dates back to 11/14/12. Because of the duration and severity of symptoms; she needs to be seen 12/6. If shortness of breath progresses; she should be seen emergently tonight

## 2012-12-04 NOTE — Telephone Encounter (Signed)
Pt still c/o coughing up reddish/greenish mucous, congestion chest and nose. Pt also notes that she does have some SOB which is helped with inhaler.Pt denies any fever. Pt indicated that she was unable to take either antibiotic due to reaction. Pt is able to take PCN and z-pak without side effect. Pt would also like to get a med for pain in wrist since the wrapping is not helping..Please advise  .

## 2012-12-04 NOTE — Telephone Encounter (Signed)
Discuss with patient see other encounter.

## 2012-12-04 NOTE — Telephone Encounter (Signed)
Discuss with patient unable to come in tomorrow due to prior engagement will call on tomorrow after checking schedule to see if she can be seen next week. Pt advise if SOB progress she need to be evaluated at ED. Pt ok and verbalized understanding.

## 2012-12-26 ENCOUNTER — Encounter (HOSPITAL_COMMUNITY): Payer: Self-pay | Admitting: Emergency Medicine

## 2012-12-26 ENCOUNTER — Emergency Department (HOSPITAL_COMMUNITY)
Admission: EM | Admit: 2012-12-26 | Discharge: 2012-12-26 | Disposition: A | Discharge status: Home / Self Care | Payer: Managed Care, Other (non HMO) | Attending: Emergency Medicine | Admitting: Emergency Medicine

## 2012-12-26 DIAGNOSIS — Z9889 Other specified postprocedural states: Secondary | ICD-10-CM | POA: Insufficient documentation

## 2012-12-26 DIAGNOSIS — F17200 Nicotine dependence, unspecified, uncomplicated: Secondary | ICD-10-CM | POA: Insufficient documentation

## 2012-12-26 DIAGNOSIS — F0390 Unspecified dementia without behavioral disturbance: Secondary | ICD-10-CM | POA: Insufficient documentation

## 2012-12-26 DIAGNOSIS — F411 Generalized anxiety disorder: Secondary | ICD-10-CM | POA: Insufficient documentation

## 2012-12-26 DIAGNOSIS — R197 Diarrhea, unspecified: Secondary | ICD-10-CM | POA: Insufficient documentation

## 2012-12-26 DIAGNOSIS — Z8739 Personal history of other diseases of the musculoskeletal system and connective tissue: Secondary | ICD-10-CM | POA: Insufficient documentation

## 2012-12-26 DIAGNOSIS — R112 Nausea with vomiting, unspecified: Secondary | ICD-10-CM

## 2012-12-26 DIAGNOSIS — R509 Fever, unspecified: Secondary | ICD-10-CM | POA: Insufficient documentation

## 2012-12-26 DIAGNOSIS — Z90710 Acquired absence of both cervix and uterus: Secondary | ICD-10-CM | POA: Insufficient documentation

## 2012-12-26 DIAGNOSIS — J45909 Unspecified asthma, uncomplicated: Secondary | ICD-10-CM | POA: Insufficient documentation

## 2012-12-26 DIAGNOSIS — G40909 Epilepsy, unspecified, not intractable, without status epilepticus: Secondary | ICD-10-CM | POA: Insufficient documentation

## 2012-12-26 DIAGNOSIS — N189 Chronic kidney disease, unspecified: Secondary | ICD-10-CM | POA: Insufficient documentation

## 2012-12-26 DIAGNOSIS — Z9089 Acquired absence of other organs: Secondary | ICD-10-CM | POA: Insufficient documentation

## 2012-12-26 DIAGNOSIS — Z79899 Other long term (current) drug therapy: Secondary | ICD-10-CM | POA: Insufficient documentation

## 2012-12-26 DIAGNOSIS — Z8719 Personal history of other diseases of the digestive system: Secondary | ICD-10-CM | POA: Insufficient documentation

## 2012-12-26 DIAGNOSIS — R1084 Generalized abdominal pain: Secondary | ICD-10-CM | POA: Insufficient documentation

## 2012-12-26 HISTORY — DX: Enthesopathy, unspecified: M77.9

## 2012-12-26 LAB — COMPREHENSIVE METABOLIC PANEL
Albumin: 3.6 g/dL (ref 3.5–5.2)
BUN: 8 mg/dL (ref 6–23)
Calcium: 9.4 mg/dL (ref 8.4–10.5)
Chloride: 103 mEq/L (ref 96–112)
Creatinine, Ser: 0.7 mg/dL (ref 0.50–1.10)
Total Bilirubin: 0.7 mg/dL (ref 0.3–1.2)
Total Protein: 7.9 g/dL (ref 6.0–8.3)

## 2012-12-26 LAB — CBC WITH DIFFERENTIAL/PLATELET
Basophils Absolute: 0.1 10*3/uL (ref 0.0–0.1)
Basophils Relative: 0 % (ref 0–1)
Hemoglobin: 17.1 g/dL — ABNORMAL HIGH (ref 12.0–15.0)
MCHC: 34.5 g/dL (ref 30.0–36.0)
Neutro Abs: 11.4 10*3/uL — ABNORMAL HIGH (ref 1.7–7.7)
Neutrophils Relative %: 69 % (ref 43–77)
RDW: 12.5 % (ref 11.5–15.5)

## 2012-12-26 LAB — URINE MICROSCOPIC-ADD ON

## 2012-12-26 LAB — URINALYSIS, ROUTINE W REFLEX MICROSCOPIC
Ketones, ur: NEGATIVE mg/dL
Leukocytes, UA: NEGATIVE
Nitrite: NEGATIVE
pH: 5.5 (ref 5.0–8.0)

## 2012-12-26 LAB — LIPASE, BLOOD: Lipase: 31 U/L (ref 11–59)

## 2012-12-26 MED ORDER — ONDANSETRON 8 MG PO TBDP
8.0000 mg | ORAL_TABLET | Freq: Once | ORAL | Status: AC
  Administered 2012-12-26: 8 mg via ORAL
  Filled 2012-12-26: qty 1

## 2012-12-26 MED ORDER — ONDANSETRON 8 MG PO TBDP: 8.0000 mg | ORAL_TABLET | Freq: Three times a day (TID) | ORAL | Status: DC | PRN

## 2012-12-26 NOTE — ED Notes (Signed)
Pt c/o n/v/d all day onset this am @ 7am. Pt states emesis and diarrhea every hour.

## 2012-12-26 NOTE — ED Notes (Addendum)
Patient c/o decreased appetite, nausea, vomiting, and diarrhea, headaches since this morning around 0700.  Patient c/o bright red blood in emesis and none in stool.  Patient c/o lower abdominal pain bilaterally.  Patient states pain worsens with movement.  Patient reports fever x 2 days up to 100.9.  Patient has been seen for sinusitis 3 weeks ago but couldn't take abx because of allergies.  Patient states vomiting and pain have improved since she took Zofran earlier.

## 2012-12-26 NOTE — ED Provider Notes (Signed)
History     CSN: [id number]  Arrival date & time 12/26/12  1914   First MD Initiated Contact with Patient 12/26/12 2131      Chief Complaint  Patient presents with  . Emesis    (Consider location/radiation/quality/duration/timing/severity/associated sxs/prior treatment) HPI Comments: Patient presents today with a chief complaint of nausea, vomiting, and diarrhea.  She reports that her symptoms began earlier today.  She has had several episodes of both vomiting and diarrhea.  No blood in her emesis or blood in her stool.  She reports that she began having some mild generalized abdominal pain after several episodes of vomiting.  She also states that earlier today she had an oral temperature of 100 F.  She has had sick contacts with similar symptoms.  She had not taken anything for her symptoms prior to arrival in the ED.    Patient is a 46 y.o. female presenting with vomiting. The history is provided by the patient.  Emesis  The emesis has an appearance of stomach contents. The maximum temperature recorded prior to her arrival was 100 to 100.9 F. Associated symptoms include abdominal pain, chills, diarrhea and a fever.    Past Medical History  Diagnosis Date  . Discoid lupus     Dr Yetta Barre, dermatologist  . Anxiety   . Asthma   . GERD (gastroesophageal reflux disease)   . Chronic kidney disease 1985    BLOCKED TUBE IN KIDNEY  . Seizures     EPILEPSY- NO SEIZURES SINCE AGE 29  . Headache     MIGRAINES  . Insomnia   . Tendonitis     Past Surgical History  Procedure Date  . Appendectomy   . Kidney surgery     right, straightened tube  . Right oophorectomy   . Ankle reconstruction 04/2009  . Other surgical history     Hysterectomy with 10cm grown removal, Dr. Jackelyn Knife  . Abdominal hysterectomy   . Cesarean section   . Cholecystectomy   . Diagnostic laparoscopy   . Laparoscopy 09/23/2012    Procedure: LAPAROSCOPY OPERATIVE;  Surgeon: Lavina Hamman, MD;  Location: [facility];   Service: Gynecology;  Laterality: N/A;    Family History  Problem Relation Age of Onset  . Coronary artery disease Mother   . Hypertension Mother   . Hypertension Father   . Colon cancer      grandfather    History  Substance Use Topics  . Smoking status: Current Every Day Smoker -- 0.5 packs/day    Types: Cigarettes  . Smokeless tobacco: Never Used  . Alcohol Use: Yes     Comment: OCCASIONAL DRINK    OB History    Grav Para Term Preterm Abortions TAB SAB Ect Mult Living                  Review of Systems  Constitutional: Positive for fever and chills.  Gastrointestinal: Positive for nausea, vomiting, abdominal pain and diarrhea. Negative for constipation and blood in stool.  Genitourinary: Negative for dysuria, urgency, frequency, vaginal bleeding, vaginal discharge and difficulty urinating.  All other systems reviewed and are negative.    Allergies  Amoxicillin-pot clavulanate; Bactrim; Ciprofloxacin hcl; Doxycycline; Keflex; and Wellbutrin  Home Medications   Current Outpatient Rx  Name  Route  Sig  Dispense  Refill  . ALBUTEROL SULFATE HFA 108 (90 BASE) MCG/ACT IN AERS   Inhalation   Inhale 2 puffs into the lungs 4 (four) times daily as needed.   18 g  1   . BUSPIRONE HCL 15 MG PO TABS   Oral   Take 15 mg by mouth daily.         . IBUPROFEN 800 MG PO TABS   Oral   Take 800 mg by mouth 2 (two) times daily.         Marland Kitchen LOPERAMIDE HCL 2 MG PO CAPS   Oral   Take 2 mg by mouth once.         Marland Kitchen LORAZEPAM 0.5 MG PO TABS   Oral   Take 1 tablet (0.5 mg total) by mouth 2 (two) times daily as needed for anxiety.   30 tablet   1   . OXYCODONE-ACETAMINOPHEN 5-325 MG PO TABS   Oral   Take 1 tablet by mouth every 4 (four) hours as needed.         Marland Kitchen PROMETHAZINE HCL 25 MG PO TABS   Oral   Take 25 mg by mouth every 6 (six) hours as needed. nausea           BP 134/90  Pulse 86  Temp 99.4 F (37.4 C) (Oral)  Resp 18  Ht 5' 1.75" (1.568 m)  Wt  196 lb (88.905 kg)  BMI 36.14 kg/m2  SpO2 97%  Physical Exam  Nursing note and vitals reviewed. Constitutional: She appears well-developed and well-nourished. No distress.  HENT:  Head: Normocephalic and atraumatic.  Mouth/Throat: Oropharynx is clear and moist.  Neck: Normal range of motion. Neck supple.  Cardiovascular: Normal rate, regular rhythm and normal heart sounds.   Pulmonary/Chest: Effort normal and breath sounds normal.  Abdominal: Soft. Bowel sounds are normal. She exhibits no distension and no mass. There is no rebound and no guarding.       Mild generalized tenderness to palpation  Neurological: She is alert.  Skin: Skin is warm and dry. She is not diaphoretic.  Psychiatric: She has a normal mood and affect.    ED Course  Procedures (including critical care time)  Labs Reviewed  COMPREHENSIVE METABOLIC PANEL - Abnormal; Notable for the following:    AST 53 (*)     ALT 51 (*)     All other components within normal limits  URINALYSIS, ROUTINE W REFLEX MICROSCOPIC - Abnormal; Notable for the following:    Color, Urine AMBER (*)  BIOCHEMICALS MAY BE AFFECTED BY COLOR   APPearance CLOUDY (*)     Hgb urine dipstick SMALL (*)     All other components within normal limits  CBC WITH DIFFERENTIAL - Abnormal; Notable for the following:    WBC 16.5 (*)     RBC 5.41 (*)     Hemoglobin 17.1 (*)     HCT 49.5 (*)     Neutro Abs 11.4 (*)     Monocytes Absolute 1.2 (*)     All other components within normal limits  URINE MICROSCOPIC-ADD ON - Abnormal; Notable for the following:    Squamous Epithelial / LPF MANY (*)     All other components within normal limits  LIPASE, BLOOD   No results found.   No diagnosis found.  10:53 PM Reassessed patient.  She is able to tolerate PO liquids.  MDM  Patient presenting with nausea, vomiting, and diarrhea.  Patient afebrile.  Abdomen soft with only very mild diffuse tenderness to palpation.  Patient given Zofran and symptoms  improved.  Labs unremarkable.  Patient able to tolerate PO liquids.  Therefore, patient discharged home with Rx for zofran.  Patient in agreement with the plan.  Return precautions discussed.        Pascal Lux [place], PA-C 12/28/12 [phone]

## 2012-12-28 NOTE — ED Provider Notes (Signed)
Medical screening examination/treatment/procedure(s) were performed by non-physician practitioner and as supervising physician I was immediately available for consultation/collaboration.   Joya Gaskins, MD 12/28/12 [phone]

## 2013-01-12 ENCOUNTER — Telehealth: Payer: Self-pay | Admitting: Family Medicine

## 2013-01-12 NOTE — Telephone Encounter (Signed)
Refill: Lorazepam 0.5 mg tab. Last fill 12-02-12

## 2013-01-13 NOTE — Telephone Encounter (Signed)
Ok to refill? Last OV 11.15.13.  Last filled 11.15.13.  

## 2013-01-14 MED ORDER — LORAZEPAM 0.5 MG PO TABS: 0.5000 mg | ORAL_TABLET | Freq: Two times a day (BID) | ORAL | Status: DC | PRN

## 2013-01-14 NOTE — Telephone Encounter (Signed)
Ok for #30, 1 refill.  needs to sign agreement and do UDS if not already done

## 2013-01-14 NOTE — Telephone Encounter (Signed)
msg left advising Rx ready for pick up.     KP 

## 2013-02-26 ENCOUNTER — Encounter: Payer: Self-pay | Admitting: Family Medicine

## 2013-03-19 ENCOUNTER — Ambulatory Visit: Payer: Managed Care, Other (non HMO) | Admitting: Family Medicine

## 2013-03-19 ENCOUNTER — Telehealth: Payer: Self-pay | Admitting: Family Medicine

## 2013-03-19 ENCOUNTER — Encounter: Payer: Self-pay | Admitting: Family Medicine

## 2013-03-19 ENCOUNTER — Ambulatory Visit (INDEPENDENT_AMBULATORY_CARE_PROVIDER_SITE_OTHER): Payer: Managed Care, Other (non HMO) | Admitting: Family Medicine

## 2013-03-19 VITALS — BP 100/50 | HR 71 | Temp 97.8°F | Ht 61.5 in | Wt 204.6 lb

## 2013-03-19 DIAGNOSIS — M62838 Other muscle spasm: Secondary | ICD-10-CM

## 2013-03-19 DIAGNOSIS — J019 Acute sinusitis, unspecified: Secondary | ICD-10-CM

## 2013-03-19 MED ORDER — LORAZEPAM 0.5 MG PO TABS: 0.5000 mg | ORAL_TABLET | Freq: Two times a day (BID) | ORAL | Status: DC | PRN

## 2013-03-19 MED ORDER — AZITHROMYCIN 250 MG PO TABS: ORAL_TABLET | ORAL | Status: DC

## 2013-03-19 NOTE — Progress Notes (Signed)
  Subjective:    Patient ID: Donna Sullivan, female    DOB: 11/09/1966, 47 y.o.   MRN: [id number]  HPI HA- 'i just feel bad'.  L eye pain, HA, nasal pain, ear pain, sore throat- 'everything hurts on this (L) side'.  'i just feel out of it'.  sxs started 2-3 days ago.  Has hx of sinus infxn.  Pt also having pain in neck and shoulder.  Reports face was 'drawing to the side (L)'.  + dizziness, 'almost like vertigo'.  Took 800mg  ibuprofen, Fioricet, sudafed, tylenol w/out relief.  + nasal congestion, productive cough.   Review of Systems For ROS see HPI     Objective:   Physical Exam  Constitutional: She appears well-developed and well-nourished. No distress.  HENT:  Head: Normocephalic and atraumatic.  Right Ear: Tympanic membrane normal.  Left Ear: Tympanic membrane normal.  Nose: Mucosal edema and rhinorrhea present. Right sinus exhibits no maxillary sinus tenderness and no frontal sinus tenderness. Left sinus exhibits maxillary sinus tenderness and frontal sinus tenderness.  Mouth/Throat: Uvula is midline and mucous membranes are normal. Posterior oropharyngeal erythema present. No oropharyngeal exudate.  Eyes: Conjunctivae and EOM are normal. Pupils are equal, round, and reactive to light.  Neck: Normal range of motion. Neck supple.  + Trap spasm  Cardiovascular: Normal rate, regular rhythm and normal heart sounds.   Pulmonary/Chest: Effort normal and breath sounds normal. No respiratory distress. She has no wheezes.  Lymphadenopathy:    She has no cervical adenopathy.          Assessment & Plan:

## 2013-03-19 NOTE — Patient Instructions (Addendum)
Start the Zpack for the sinuses Add Claritin or Zyrtec daily (store brand generic is fine) Drink plenty of fluids Mucinex to thin your congestion REST! Tylenol/ibuprofen for pain/muscle spasm Heating pad for neck/shoulder Hang in there!!!

## 2013-03-19 NOTE — Telephone Encounter (Signed)
Patient called back & said she still hasn't found it. She also wanted Dr. Beverely Low know that she only uses [facility]. She will continue to look but if she can't find it by tomorrow can she get a replacement Rx? Please advise

## 2013-03-19 NOTE — Telephone Encounter (Signed)
Patient called to state that she cannot find her Lorazapem Rx. She states she folded it but cannot locate now.

## 2013-03-23 NOTE — Telephone Encounter (Signed)
Spoke with patient, patient states she found rx.

## 2013-03-29 DIAGNOSIS — M62838 Other muscle spasm: Secondary | ICD-10-CM | POA: Insufficient documentation

## 2013-03-29 NOTE — Assessment & Plan Note (Signed)
Pt's sxs and PE consistent w/ infxn.  Start abx- Zpack at pt's request due to multiple medication allergies.  Encouraged daily antihistamine to prevent allergy contribution.  Reviewed supportive care and red flags that should prompt return.  Pt expressed understanding and is in agreement w/ plan.

## 2013-03-29 NOTE — Assessment & Plan Note (Signed)
New.  Alternate NSAIDs, tylenol.  Heat prn.  Reviewed supportive care and red flags that should prompt return.  Pt expressed understanding and is in agreement w/ plan.

## 2013-04-10 ENCOUNTER — Ambulatory Visit (INDEPENDENT_AMBULATORY_CARE_PROVIDER_SITE_OTHER): Payer: PRIVATE HEALTH INSURANCE | Admitting: Family Medicine

## 2013-04-10 ENCOUNTER — Encounter: Payer: Self-pay | Admitting: Family Medicine

## 2013-04-10 VITALS — BP 130/76 | HR 87 | Temp 98.1°F | Ht 61.25 in | Wt 203.0 lb

## 2013-04-10 DIAGNOSIS — R0789 Other chest pain: Secondary | ICD-10-CM | POA: Insufficient documentation

## 2013-04-10 DIAGNOSIS — Z0000 Encounter for general adult medical examination without abnormal findings: Secondary | ICD-10-CM | POA: Insufficient documentation

## 2013-04-10 DIAGNOSIS — R1032 Left lower quadrant pain: Secondary | ICD-10-CM | POA: Insufficient documentation

## 2013-04-10 DIAGNOSIS — R1011 Right upper quadrant pain: Secondary | ICD-10-CM

## 2013-04-10 DIAGNOSIS — R079 Chest pain, unspecified: Secondary | ICD-10-CM

## 2013-04-10 DIAGNOSIS — Z1231 Encounter for screening mammogram for malignant neoplasm of breast: Secondary | ICD-10-CM

## 2013-04-10 LAB — HEPATIC FUNCTION PANEL
ALT: 60 U/L — ABNORMAL HIGH (ref 0–35)
Total Protein: 7.3 g/dL (ref 6.0–8.3)

## 2013-04-10 LAB — LIPID PANEL
HDL: 42 mg/dL (ref >39)
LDL Cholesterol: 136 mg/dL — ABNORMAL HIGH (ref 0–99)
Triglycerides: 216 mg/dL — ABNORMAL HIGH (ref <150)
VLDL: 43 mg/dL — ABNORMAL HIGH (ref 0–40)

## 2013-04-10 NOTE — Assessment & Plan Note (Signed)
New.  Normal EKG.  Pt's pain is reproducible w/ palpation of chest wall.  Rheum feels pt likely has fibromyalgia and her current pain would be consistent.  NSAIDs prn.  Alternate heat/ice.  Reviewed supportive care and red flags that should prompt return.  Pt expressed understanding and is in agreement w/ plan.

## 2013-04-10 NOTE — Assessment & Plan Note (Signed)
Pt's PE WNL w/ exception of RUQ and chest pain (see above).  UTD on GYN, overdue on mammo- order entered.  Pt had recent labs at rheum- will not repeat those but get remaining wellness labs.  EKG done- see document for interpretation.  Anticipatory guidance provided.

## 2013-04-10 NOTE — Assessment & Plan Note (Signed)
New.  No obvious cause.  Pt s/p cholecystectomy.  Get LFTs, RUQ [place].  No current N/V.  Reviewed supportive care and red flags that should prompt return.  Pt expressed understanding and is in agreement w/ plan.

## 2013-04-10 NOTE — Patient Instructions (Addendum)
We'll notify you of your lab results and ultrasound If your symptoms change or worsen over the weekend- go to the ER We'll call you with your mammogram appt Ibuprofen as needed for pain Alternate heat/ice for pain relief Call with any questions or concerns Hang in there!

## 2013-04-10 NOTE — Progress Notes (Signed)
  Subjective:    Patient ID: Donna Sullivan, female    DOB: 02/12/1966, 47 y.o.   MRN: [id number]  HPI CPE- UTD on pap and now s/p hysterectomy.  Has never had mammo.  CP- sxs started 'a couple of days ago'.  'feels like someone punched me right here (L chest)'.  Pain is constant, 'almost all the way through'.  Pain described as an ache.  Will breathe deep and get a 'sharp pain in my shoulder blade'.  Pt went to rheum Kellie Simmering) who feels that pt may have fibromyalgia due to migratory pains.  + smoking.   Review of Systems Patient reports no vision/ hearing changes, adenopathy, weight change, swallowing issues, palpitations, edema, hemoptysis, gastrointestinal bleeding (melena, rectal bleeding), bowel changes, GU symptoms (dysuria, hematuria, incontinence), Gyn symptoms (abnormal  bleeding, pain),  syncope, focal weakness, memory loss, numbness & tingling, skin/hair/nail changes, abnormal bruising or bleeding, anxiety, or depression.   + fevers/chills + hoarseness/cough + CP + SOB w/ exertion + GERD- good relief w/ husband's PPI (omeprazole) + RUQ pain- 'steady cramp' x1 week, no N/V    Objective:   Physical Exam  General Appearance:    Alert, cooperative, no distress, appears stated age  Head:    Normocephalic, without obvious abnormality, atraumatic  Eyes:    PERRL, conjunctiva/corneas clear, EOM's intact, fundi    benign, both eyes  Ears:    Normal TM's and external ear canals, both ears  Nose:   Nares normal, septum midline, mucosa normal, no drainage    or sinus tenderness  Throat:   Lips, mucosa, and tongue normal; teeth and gums normal  Neck:   Supple, symmetrical, trachea midline, no adenopathy;    Thyroid: no enlargement/tenderness/nodules  Back:     Symmetric, no curvature, ROM normal, no CVA tenderness  Lungs:     Clear to auscultation bilaterally, respirations unlabored  Chest Wall:    + TTP over L chest wall, no deformity   Heart:    Regular rate and rhythm, S1 and S2  normal, no murmur, rub   or gallop  Breast Exam:    No tenderness, masses, or nipple abnormality  Abdomen:     Soft, bowel sounds active all four quadrants, no masses, no organomegaly.  + TTP over RUQ- no rebound/guarding  Genitalia:    deferred  Rectal:    Extremities:   Extremities normal, atraumatic, no cyanosis or edema  Pulses:   2+ and symmetric all extremities  Skin:   Skin color, texture, turgor normal, no rashes or lesions  Lymph nodes:   Cervical, supraclavicular, and axillary nodes normal  Neurologic:   CNII-XII intact, normal strength, sensation and reflexes    throughout          Assessment & Plan:

## 2013-04-13 NOTE — Addendum Note (Signed)
Addended by: Sheliah Hatch on: 04/13/2013 10:11 AM   Modules accepted: Orders

## 2013-04-14 ENCOUNTER — Telehealth: Payer: Self-pay | Admitting: *Deleted

## 2013-04-14 ENCOUNTER — Other Ambulatory Visit: Payer: Managed Care, Other (non HMO)

## 2013-04-14 DIAGNOSIS — R945 Abnormal results of liver function studies: Secondary | ICD-10-CM

## 2013-04-14 NOTE — Telephone Encounter (Addendum)
Spoke with the pt and informed her of recent lab results and note.  Pt understood and agreed to [place] and added lab work.  Pt stated that she spoke with someone on yest and they informed her that she needed an [place] and they scheduled her for [facility]. 04/16/13.  Pt asked if she could be scheduled for lab appt on Thurs as well.  Pt was scheduled for lab appt on Thurs.  Lab ordered and sent.//AB/CMA

## 2013-04-14 NOTE — Telephone Encounter (Signed)
Message copied by Verdie Shire on Tue Apr 14, 2013  8:30 AM ------      Message from: Sheliah Hatch      Created: Sun Apr 12, 2013  5:54 PM       Total cholesterol, LDL and Trigs are all elevated.  Needs to start cholesterol med but due to elevated liver functions cannot at this time.      Needs to get acute hepatitis panel (dx abnormal LFTs) and RUQ [place] ------

## 2013-04-16 ENCOUNTER — Ambulatory Visit (HOSPITAL_BASED_OUTPATIENT_CLINIC_OR_DEPARTMENT_OTHER)
Admission: RE | Admit: 2013-04-16 | Discharge: 2013-04-16 | Disposition: A | Discharge status: Home / Self Care | Payer: Managed Care, Other (non HMO) | Source: Ambulatory Visit | Attending: Family Medicine | Admitting: Family Medicine

## 2013-04-16 ENCOUNTER — Other Ambulatory Visit: Payer: Managed Care, Other (non HMO)

## 2013-04-16 DIAGNOSIS — R7989 Other specified abnormal findings of blood chemistry: Secondary | ICD-10-CM | POA: Insufficient documentation

## 2013-04-16 DIAGNOSIS — Z90710 Acquired absence of both cervix and uterus: Secondary | ICD-10-CM | POA: Insufficient documentation

## 2013-04-16 DIAGNOSIS — Z1231 Encounter for screening mammogram for malignant neoplasm of breast: Secondary | ICD-10-CM | POA: Insufficient documentation

## 2013-04-16 DIAGNOSIS — K219 Gastro-esophageal reflux disease without esophagitis: Secondary | ICD-10-CM | POA: Insufficient documentation

## 2013-04-16 DIAGNOSIS — Z9089 Acquired absence of other organs: Secondary | ICD-10-CM | POA: Insufficient documentation

## 2013-04-16 DIAGNOSIS — R1011 Right upper quadrant pain: Secondary | ICD-10-CM | POA: Insufficient documentation

## 2013-04-16 DIAGNOSIS — M329 Systemic lupus erythematosus, unspecified: Secondary | ICD-10-CM | POA: Insufficient documentation

## 2013-04-16 DIAGNOSIS — R945 Abnormal results of liver function studies: Secondary | ICD-10-CM

## 2013-04-17 ENCOUNTER — Telehealth: Payer: Self-pay | Admitting: Family Medicine

## 2013-04-17 LAB — HEPATITIS PANEL, ACUTE
Hep B C IgM: NEGATIVE
Hepatitis B Surface Ag: NEGATIVE

## 2013-04-17 NOTE — Telephone Encounter (Signed)
Left detailed msg on pt's vmail to let her know the results are not back yet.

## 2013-04-17 NOTE — Telephone Encounter (Signed)
Patient would like to know if her blood test results have come back. Also have her mammo results come back?

## 2013-04-20 ENCOUNTER — Telehealth: Payer: Self-pay | Admitting: Family Medicine

## 2013-04-20 ENCOUNTER — Encounter: Payer: Self-pay | Admitting: *Deleted

## 2013-04-20 NOTE — Telephone Encounter (Signed)
Pt called back today and was given her latest results. However when pt was told that based on her latest [place] of her abdomen her gall bladder was normal. However pt then stated that she does not have a gall bladder. States she is still having abdominal pain.   Also would like to know if we have gotten any mammogram results.

## 2013-04-20 NOTE — Telephone Encounter (Signed)
US showed fatty liver and absent GB Refer to gi for abd pain

## 2013-04-21 NOTE — Telephone Encounter (Signed)
Spoke with the pt and informed her of the [place] results and that Dr. Laury Axon recommend referring her to GI for the abd pain.  Pt stated that she did not want to go to the GI doctor so do not schedule an appt , and she will just loss wt to help with the fatty liver.  Pt stated that she will make an appt to follow-up with Dr. Beverely Low when she comes back.  Pt expressed she was alittle upset b/c she was given the wrong information about her [place] results, been told that we did not have a updated phone # for her, and been told that she did not have Hepatitis which she knew.  Informed the pt to make sure she calls back to schedule an follow-up with Dr. Beverely Low.  Pt agreed.//AB/CMA

## 2013-05-18 ENCOUNTER — Encounter: Payer: Self-pay | Admitting: Family Medicine

## 2013-06-02 ENCOUNTER — Telehealth: Payer: Self-pay | Admitting: Family Medicine

## 2013-06-02 NOTE — Telephone Encounter (Signed)
Patient is calling to discuss her test results from 04/10/2013. States that she wants to speak with Dr. Beverely Low before she comes in for an appointment.

## 2013-06-02 NOTE — Telephone Encounter (Signed)
Spoke with the pt and she asked to speak with Dr. Beverely Low.  I explained to the pt that Dr. Beverely Low had already gone for the day and is there something I could help her with.  Pt stated that the last time she spoke with someone they gave her her lab and [place] results and they told her the wrong thing.  Pt said she was concerned because she was told she had a gallbladder and she does not have a gallbladder.  Informed the pt of the last telephone note regarding her labs and [place].  Pt understood what was told to her.  Asked the pt about her calling back to schedule an appt with Dr. Sharen Counter the pt stated that's why she wanted to speak with Dr. Beverely Low to see if she need an appt.  Informed the pt if she feels she needs to go over the recent labs and [place] with Dr. Beverely Low she will need an appt.  Pt stated that she was having (L) side pain around the wrist, which started on last Wed or Thurs.  Pt said she felt the pain only when she coughed,but today she has felt it more.  Informed the pt that since this is a new problems she will need to schedule an appt with Dr. Beverely Low.  Pt agreed and scheduled an appt for (06-08-13).//AB/CMA

## 2013-06-08 ENCOUNTER — Encounter: Payer: Self-pay | Admitting: Family Medicine

## 2013-06-08 ENCOUNTER — Ambulatory Visit (INDEPENDENT_AMBULATORY_CARE_PROVIDER_SITE_OTHER): Payer: PRIVATE HEALTH INSURANCE | Admitting: Family Medicine

## 2013-06-08 VITALS — BP 118/78 | HR 67 | Temp 98.1°F | Ht 61.25 in | Wt 202.4 lb

## 2013-06-08 DIAGNOSIS — R109 Unspecified abdominal pain: Secondary | ICD-10-CM | POA: Insufficient documentation

## 2013-06-08 LAB — POCT URINALYSIS DIPSTICK
Bilirubin, UA: NEGATIVE
Ketones, UA: NEGATIVE
Protein, UA: NEGATIVE
Spec Grav, UA: 1.01
pH, UA: 6

## 2013-06-08 MED ORDER — LORAZEPAM 0.5 MG PO TABS: 0.5000 mg | ORAL_TABLET | Freq: Two times a day (BID) | ORAL | Status: DC | PRN

## 2013-06-08 MED ORDER — HYDROCODONE-ACETAMINOPHEN 5-325 MG PO TABS: 1.0000 | ORAL_TABLET | Freq: Four times a day (QID) | ORAL | Status: DC | PRN

## 2013-06-08 NOTE — Assessment & Plan Note (Signed)
New.  Pt w/out evidence of pyelo on PE or UA.  Must consider renal stone but pt not in severe pain.  May be musculoskeletal.  Start pain meds prn.  Continue NSAIDs, heating pad.  Reviewed supportive care and red flags that should prompt return.  Pt expressed understanding and is in agreement w/ plan.

## 2013-06-08 NOTE — Progress Notes (Signed)
  Subjective:    Patient ID: Donna Sullivan, female    DOB: [birth year], 47 y.o.   MRN: [id number]  HPI L flank pain- sxs started 'a couple of weeks ago'.  Radiates from back around to front.  Worse w/ coughing and movement.  No dysuria, frequency, urgency.  No radiation of pain up or down.  No rash.  Has not taken any pain meds, no relief w/ 800mg  ibuprofen.  Attempting to control pain w/ rest.   Review of Systems For ROS see HPI     Objective:   Physical Exam  Vitals reviewed. Constitutional: She is oriented to person, place, and time. She appears well-developed and well-nourished. No distress.  Pulmonary/Chest: Effort normal and breath sounds normal. No respiratory distress. She has no wheezes. She has no rales. She exhibits no tenderness.  Abdominal: Soft. Bowel sounds are normal. She exhibits no distension. There is no tenderness.  Musculoskeletal:  Pt w/ pain extending around L lower ribs, TTP No CVA tenderness  Neurological: She is alert and oriented to person, place, and time.  Skin: Skin is warm and dry.          Assessment & Plan:

## 2013-06-08 NOTE — Patient Instructions (Addendum)
If your pain worsens, please let me know Use the hydrocodone as needed for pain Use the heating pad for pain relief Call with any questions or concerns Hang in there!

## 2013-06-12 LAB — URINE CULTURE: Colony Count: NO GROWTH

## 2013-06-22 ENCOUNTER — Encounter: Payer: Self-pay | Admitting: Family Medicine

## 2013-07-31 LAB — HM PAP SMEAR

## 2013-08-03 ENCOUNTER — Other Ambulatory Visit: Payer: Self-pay | Admitting: Family Medicine

## 2013-08-05 NOTE — Telephone Encounter (Signed)
Last refill:07-08-13 Last OV:06-08-13 UDS:01-14-13-High Risk Please advise.//AB/CMA

## 2013-08-10 ENCOUNTER — Telehealth: Payer: Self-pay | Admitting: Family Medicine

## 2013-08-10 NOTE — Telephone Encounter (Signed)
LVM that would need to be seen in order to determine if needs antibiotics.

## 2013-08-10 NOTE — Telephone Encounter (Signed)
Patient states she is "blowing green" and would like a z-pak. She uses [facility]. Advised pt she may need OV.

## 2013-08-10 NOTE — Telephone Encounter (Signed)
Please call pt and let her know that due to the fact the her last UDS was + for marijuana we are unable to fill her Lorazepam.  Once she has stopped using illegal substances we could discuss starting meds again.

## 2013-08-19 NOTE — Telephone Encounter (Signed)
Gaye- Can you contact this patient? Thank you.

## 2013-09-11 ENCOUNTER — Ambulatory Visit (INDEPENDENT_AMBULATORY_CARE_PROVIDER_SITE_OTHER): Payer: Managed Care, Other (non HMO) | Admitting: Family Medicine

## 2013-09-11 ENCOUNTER — Other Ambulatory Visit: Payer: Self-pay | Admitting: Family Medicine

## 2013-09-11 ENCOUNTER — Encounter: Payer: Self-pay | Admitting: Family Medicine

## 2013-09-11 VITALS — BP 102/70 | HR 72 | Temp 98.2°F | Wt 198.8 lb

## 2013-09-11 DIAGNOSIS — F410 Panic disorder [episodic paroxysmal anxiety] without agoraphobia: Secondary | ICD-10-CM

## 2013-09-11 DIAGNOSIS — J019 Acute sinusitis, unspecified: Secondary | ICD-10-CM

## 2013-09-11 MED ORDER — LORAZEPAM 0.5 MG PO TABS: 0.5000 mg | ORAL_TABLET | Freq: Two times a day (BID) | ORAL | Status: DC | PRN

## 2013-09-11 MED ORDER — GUAIFENESIN-CODEINE 100-10 MG/5ML PO SYRP: 10.0000 mL | ORAL_SOLUTION | Freq: Three times a day (TID) | ORAL | Status: DC | PRN

## 2013-09-11 MED ORDER — PROMETHAZINE HCL 25 MG PO TABS: 25.0000 mg | ORAL_TABLET | Freq: Four times a day (QID) | ORAL | Status: DC | PRN

## 2013-09-11 MED ORDER — AZITHROMYCIN 250 MG PO TABS: ORAL_TABLET | ORAL | Status: DC

## 2013-09-11 NOTE — Patient Instructions (Addendum)
Start the Zpack for the sinus infection Drink plenty of fluids Use the cough syrup as needed REST! Call with any questions or concerns Hang in there!

## 2013-09-11 NOTE — Progress Notes (Signed)
  Subjective:    Patient ID: Donna Sullivan, female    DOB: July 31, 1966, 47 y.o.   MRN: [id number]  HPI Sinusitis- 'i think i have a sinus infection and the beginning of bronchitis'.  sxs started 2 weeks ago w/ ear fullness.  + nasal congestion, PND.  + sinus pain/pressure.  + productive cough- green/brown sputum.  Some SOB.  Diffuse joint pains- will come and go, never localized to 1 particular area.  Currently having neck and low back pain.  Feels that the neck pain is the start of a migraine.  Taking ibuprofen and acetaminophen w/ some relief.  Has not been taking butalbital- not effective.  Anxiety- wants to restart Ativan   Review of Systems For ROS see HPI     Objective:   Physical Exam  Vitals reviewed. Constitutional: She appears well-developed and well-nourished. No distress.  HENT:  Head: Normocephalic and atraumatic.  Right Ear: Tympanic membrane normal.  Left Ear: Tympanic membrane normal.  Nose: Mucosal edema and rhinorrhea present. Right sinus exhibits maxillary sinus tenderness and frontal sinus tenderness. Left sinus exhibits maxillary sinus tenderness and frontal sinus tenderness.  Mouth/Throat: Uvula is midline and mucous membranes are normal. Posterior oropharyngeal erythema present. No oropharyngeal exudate.  Eyes: Conjunctivae and EOM are normal. Pupils are equal, round, and reactive to light.  Neck: Normal range of motion. Neck supple.  Cardiovascular: Normal rate, regular rhythm and normal heart sounds.   Pulmonary/Chest: Effort normal and breath sounds normal. No respiratory distress. She has no wheezes.  Lymphadenopathy:    She has no cervical adenopathy.          Assessment & Plan:

## 2013-09-13 NOTE — Assessment & Plan Note (Signed)
Pt wants to restart Ativan.  Has had abnormal UDS in past- pt to repeat test today.  Refill given w/ understanding that if UDS is again abnormal, no additional controlled substances will be given.  Pt expressed understanding and is in agreement w/ plan.

## 2013-09-13 NOTE — Assessment & Plan Note (Signed)
Pt's sxs and PE consistent w/ infxn.  Due to multiple drug allergies/intolerances, will tx w/ Zpack.  Reviewed supportive care and red flags that should prompt return.  Pt expressed understanding and is in agreement w/ plan.

## 2013-09-15 NOTE — Telephone Encounter (Signed)
Rx filled and sent to pharmacy. SW, CMA 

## 2013-09-25 ENCOUNTER — Telehealth: Payer: Self-pay | Admitting: General Practice

## 2013-09-25 NOTE — Telephone Encounter (Signed)
Pt can either be seen at [facility] tonight or go to [facility] in the AM.  No controlled substances/pain meds due + THC on drug screen

## 2013-09-25 NOTE — Telephone Encounter (Signed)
Triage line message: pt called stating that she is having pain in her upper chest and shoulders. Please advise.

## 2013-09-25 NOTE — Telephone Encounter (Signed)
Spoke with pt. Clarified that it is more of a fibromyalgia pain. Pt states that she will seek treatment at [facility] unless pain becomes to severe tonight.

## 2013-10-20 ENCOUNTER — Other Ambulatory Visit: Payer: Self-pay | Admitting: *Deleted

## 2013-10-20 MED ORDER — IBUPROFEN 800 MG PO TABS: 800.0000 mg | ORAL_TABLET | Freq: Two times a day (BID) | ORAL | Status: DC

## 2013-10-20 NOTE — Telephone Encounter (Signed)
Pt called and stated that she is in pain due to her fibromyalgia. Pt is requesting ibuprofen 800mg . Rx filled and sent to to pharmacy

## 2013-11-10 ENCOUNTER — Encounter: Payer: Self-pay | Admitting: Family Medicine

## 2013-12-08 ENCOUNTER — Telehealth: Payer: Self-pay | Admitting: Family Medicine

## 2013-12-08 NOTE — Telephone Encounter (Signed)
Patient called and stated that she left a message on the triage line but no one called her back. Patient wanted to see if dr Beverely Low could send something to the pharmacy for her. Patient has a bad cough,clogged up and congested. She states that she does not have money to come in and pay for a prescription too. I informed patient that dr Beverely Low probably will need to see her to do an evaluation but patient insisted that I inform dr Beverely Low of her situation.

## 2013-12-09 ENCOUNTER — Ambulatory Visit (INDEPENDENT_AMBULATORY_CARE_PROVIDER_SITE_OTHER): Payer: PRIVATE HEALTH INSURANCE | Admitting: Family Medicine

## 2013-12-09 ENCOUNTER — Encounter: Payer: Self-pay | Admitting: Family Medicine

## 2013-12-09 VITALS — BP 138/80 | HR 70 | Temp 98.1°F | Wt 204.4 lb

## 2013-12-09 DIAGNOSIS — J209 Acute bronchitis, unspecified: Secondary | ICD-10-CM | POA: Insufficient documentation

## 2013-12-09 DIAGNOSIS — J019 Acute sinusitis, unspecified: Secondary | ICD-10-CM

## 2013-12-09 DIAGNOSIS — J40 Bronchitis, not specified as acute or chronic: Secondary | ICD-10-CM

## 2013-12-09 MED ORDER — PROMETHAZINE-DM 6.25-15 MG/5ML PO SYRP: 5.0000 mL | ORAL_SOLUTION | Freq: Four times a day (QID) | ORAL | Status: DC | PRN

## 2013-12-09 MED ORDER — ALBUTEROL SULFATE (2.5 MG/3ML) 0.083% IN NEBU
2.5000 mg | INHALATION_SOLUTION | Freq: Once | RESPIRATORY_TRACT | Status: AC
  Administered 2013-12-09: 2.5 mg via RESPIRATORY_TRACT

## 2013-12-09 MED ORDER — AZITHROMYCIN 250 MG PO TABS: ORAL_TABLET | ORAL | Status: AC

## 2013-12-09 NOTE — Addendum Note (Signed)
Addended by: Verdie Shire on: 12/09/2013 02:45 PM   Modules accepted: Orders

## 2013-12-09 NOTE — Assessment & Plan Note (Signed)
Air movement improved s/p neb tx.  Pt given Proair sample.  Start cough meds prn.  Reviewed supportive care and red flags that should prompt return.  Pt expressed understanding and is in agreement w/ plan.

## 2013-12-09 NOTE — Patient Instructions (Signed)
Follow up as needed Start the Zpack as directed Use the Albuterol inhaler- 2 puffs every 4-6 hrs as needed for cough/wheezing Use the cough syrup as needed- may cause drowsiness Drink plenty of fluids REST! Call with any questions or concerns Hang in there! Happy Holidays!

## 2013-12-09 NOTE — Assessment & Plan Note (Signed)
New.  Start Zpack due to pt's multiple allergies.  Reviewed supportive care and red flags that should prompt return.  Pt expressed understanding and is in agreement w/ plan.

## 2013-12-09 NOTE — Progress Notes (Signed)
Pre visit review using our clinic review tool, if applicable. No additional management support is needed unless otherwise documented below in the visit note. 

## 2013-12-09 NOTE — Telephone Encounter (Signed)
Pt was seen today in the office by Dr. Deboraha Sprang

## 2013-12-09 NOTE — Progress Notes (Signed)
   Subjective:    Patient ID: Donna Sullivan, female    DOB: 06/19/1966, 47 y.o.   MRN: [id number]  HPI URI- cough- dry, barking, nasal congestion, body aches, 'my whole body hurts'.  abd strain from coughing w/ stress incontinence.  + facial pain, HA.  + sore throat, bilateral ear fullness.  + gum pain.  Subjective fever- 'i can taste it'.  No known sick contacts.  Reports SOB and wheezing- using friends inhaler.   Review of Systems For ROS see HPI     Objective:   Physical Exam  Vitals reviewed. Constitutional: She appears well-developed and well-nourished. No distress.  HENT:  Head: Normocephalic and atraumatic.  Right Ear: Tympanic membrane normal.  Left Ear: Tympanic membrane normal.  Nose: Mucosal edema and rhinorrhea present. Right sinus exhibits maxillary sinus tenderness and frontal sinus tenderness. Left sinus exhibits maxillary sinus tenderness and frontal sinus tenderness.  Mouth/Throat: Uvula is midline and mucous membranes are normal. Posterior oropharyngeal erythema present. No oropharyngeal exudate.  Eyes: Conjunctivae and EOM are normal. Pupils are equal, round, and reactive to light.  Neck: Normal range of motion. Neck supple.  Cardiovascular: Normal rate, regular rhythm and normal heart sounds.   Pulmonary/Chest: Effort normal and breath sounds normal. No respiratory distress. She has no wheezes.  + hacking cough Decreased air movement, improved s/p neb tx  Lymphadenopathy:    She has no cervical adenopathy.          Assessment & Plan:

## 2013-12-28 ENCOUNTER — Other Ambulatory Visit: Payer: Self-pay | Admitting: Family Medicine

## 2013-12-28 NOTE — Telephone Encounter (Signed)
LORazepam (ATIVAN) 0.5 MG tablet Last refill: 09/11/13 #30, 1 refill Last OV: 12/09/13 High risk, UDS due

## 2014-01-12 ENCOUNTER — Other Ambulatory Visit: Payer: Self-pay | Admitting: Family Medicine

## 2014-01-12 NOTE — Telephone Encounter (Signed)
Last OV 12-09-13 Med filled 09-11-13 #30 with 1

## 2014-03-05 ENCOUNTER — Encounter: Payer: Self-pay | Admitting: Family Medicine

## 2014-03-05 ENCOUNTER — Ambulatory Visit (INDEPENDENT_AMBULATORY_CARE_PROVIDER_SITE_OTHER): Payer: BC Managed Care – PPO | Admitting: Family Medicine

## 2014-03-05 VITALS — BP 124/80 | HR 78 | Temp 98.2°F | Resp 16 | Wt 199.0 lb

## 2014-03-05 DIAGNOSIS — F410 Panic disorder [episodic paroxysmal anxiety] without agoraphobia: Secondary | ICD-10-CM

## 2014-03-05 DIAGNOSIS — R3129 Other microscopic hematuria: Secondary | ICD-10-CM

## 2014-03-05 DIAGNOSIS — J209 Acute bronchitis, unspecified: Secondary | ICD-10-CM

## 2014-03-05 DIAGNOSIS — R109 Unspecified abdominal pain: Secondary | ICD-10-CM

## 2014-03-05 LAB — POCT URINALYSIS DIPSTICK
BILIRUBIN UA: NEGATIVE
Glucose, UA: NEGATIVE
Ketones, UA: NEGATIVE
Leukocytes, UA: NEGATIVE
Nitrite, UA: NEGATIVE
PH UA: 5
Protein, UA: NEGATIVE
Spec Grav, UA: 1.01
UROBILINOGEN UA: 0.2

## 2014-03-05 MED ORDER — HYDROCODONE-ACETAMINOPHEN 5-325 MG PO TABS: 1.0000 | ORAL_TABLET | Freq: Four times a day (QID) | ORAL | Status: DC | PRN

## 2014-03-05 MED ORDER — LORAZEPAM 0.5 MG PO TABS: 0.5000 mg | ORAL_TABLET | Freq: Two times a day (BID) | ORAL | Status: DC | PRN

## 2014-03-05 MED ORDER — AMPICILLIN 500 MG PO CAPS: 500.0000 mg | ORAL_CAPSULE | Freq: Four times a day (QID) | ORAL | Status: DC

## 2014-03-05 MED ORDER — TRAMADOL HCL 50 MG PO TABS: 50.0000 mg | ORAL_TABLET | Freq: Three times a day (TID) | ORAL | Status: DC | PRN

## 2014-03-05 NOTE — Assessment & Plan Note (Signed)
Recurrent issue for pt.  No evidence of bacterial infxn but this will be covered w/ Ampicillin.  Encouraged pt to use inhalers as needed.  Cough meds prn.

## 2014-03-05 NOTE — Assessment & Plan Note (Signed)
Refill on Ativan provided.

## 2014-03-05 NOTE — Assessment & Plan Note (Signed)
Pt having suprapubic pain w/ blood in urine.  Since pain is radiating to back must consider pyelo or stone.  Pt is allergic to multiple meds and this makes tx options very limited and difficult.  Considered fosfomycin but this doesn't penetrate kidney tissue.  Will start Ampicillin and await culture results.  Hydrocodone prn in case of stone.

## 2014-03-05 NOTE — Progress Notes (Signed)
Pre visit review using our clinic review tool, if applicable. No additional management support is needed unless otherwise documented below in the visit note. 

## 2014-03-05 NOTE — Progress Notes (Signed)
   Subjective:    Patient ID: Donna Sullivan, female    DOB: Dec 03, 1966, 48 y.o.   MRN: [id number]  HPI ? UTI- pt having pressure w/ urination and suprapubic pain radiating from groin up and around to flanks, L>R.  Taking AZO Cranberry.  Increased frequency, hesitancy.  No pain over L kidney.  sxs started 4-5 days ago  URI- pt reports cough and congestion for 'over a week'.  Increased wheezing.  No sinus pain/pressure.  + nasal congestion.  No ear pain.  No fevers.  Anxiety- pt asking for refill on Ativan.   Review of Systems For ROS see HPI     Objective:   Physical Exam  Vitals reviewed. Constitutional: She is oriented to person, place, and time. She appears well-developed and well-nourished. No distress.  HENT:  Head: Normocephalic and atraumatic.  TMs normal bilaterally Mild nasal congestion No TTP over sinuses Throat w/out erythema, edema, or exudate  Eyes: Conjunctivae and EOM are normal. Pupils are equal, round, and reactive to light.  Neck: Normal range of motion. Neck supple.  Cardiovascular: Normal rate, regular rhythm, normal heart sounds and intact distal pulses.   No murmur heard. Pulmonary/Chest: Effort normal and breath sounds normal. No respiratory distress. She has no wheezes.  + hacking cough  Abdominal: Soft. She exhibits no distension. There is tenderness (suprapubic tenderness w/o CVA tenderness). There is no rebound and no guarding.  Lymphadenopathy:    She has no cervical adenopathy.  Neurological: She is alert and oriented to person, place, and time.  Skin: Skin is warm and dry.  Psychiatric: She has a normal mood and affect. Her behavior is normal.          Assessment & Plan:

## 2014-03-05 NOTE — Patient Instructions (Signed)
Follow up as needed If your abdominal pain changes or worsens- please call Drink plenty of fluids Start the Ampicillin as directed Use the Tramadol as needed Call with any questions or concerns Hang in there!!!

## 2014-03-07 LAB — URINE CULTURE
Colony Count: NO GROWTH
Organism ID, Bacteria: NO GROWTH

## 2014-03-08 ENCOUNTER — Other Ambulatory Visit: Payer: Self-pay | Admitting: Family Medicine

## 2014-03-08 ENCOUNTER — Ambulatory Visit (HOSPITAL_COMMUNITY)
Admission: RE | Admit: 2014-03-08 | Discharge: 2014-03-08 | Disposition: A | Discharge status: Home / Self Care | Payer: BC Managed Care – PPO | Source: Ambulatory Visit | Attending: Family Medicine | Admitting: Family Medicine

## 2014-03-08 DIAGNOSIS — N839 Noninflammatory disorder of ovary, fallopian tube and broad ligament, unspecified: Secondary | ICD-10-CM | POA: Insufficient documentation

## 2014-03-08 DIAGNOSIS — R109 Unspecified abdominal pain: Secondary | ICD-10-CM

## 2014-03-08 DIAGNOSIS — R319 Hematuria, unspecified: Secondary | ICD-10-CM

## 2014-03-08 NOTE — Addendum Note (Signed)
Addended by: Kris Hartmann on: 03/08/2014 11:39 AM   Modules accepted: Orders

## 2014-03-09 ENCOUNTER — Other Ambulatory Visit: Payer: Self-pay | Admitting: Family Medicine

## 2014-03-09 ENCOUNTER — Telehealth: Payer: Self-pay | Admitting: Family Medicine

## 2014-03-09 DIAGNOSIS — N83209 Unspecified ovarian cyst, unspecified side: Secondary | ICD-10-CM

## 2014-03-09 NOTE — Telephone Encounter (Signed)
Patient called to inform dr Birdie Riddle that she would like to be set up with a referral. Thanks

## 2014-03-09 NOTE — Telephone Encounter (Signed)
Referral placed this morning. 

## 2014-03-18 ENCOUNTER — Ambulatory Visit (INDEPENDENT_AMBULATORY_CARE_PROVIDER_SITE_OTHER): Payer: BC Managed Care – PPO | Admitting: Gynecology

## 2014-03-18 ENCOUNTER — Encounter: Payer: Self-pay | Admitting: Gynecology

## 2014-03-18 ENCOUNTER — Other Ambulatory Visit: Payer: Self-pay | Admitting: Gynecology

## 2014-03-18 ENCOUNTER — Other Ambulatory Visit (HOSPITAL_COMMUNITY)
Admission: RE | Admit: 2014-03-18 | Discharge: 2014-03-18 | Disposition: A | Discharge status: Home / Self Care | Payer: BC Managed Care – PPO | Source: Ambulatory Visit | Attending: Gynecology | Admitting: Gynecology

## 2014-03-18 ENCOUNTER — Ambulatory Visit (INDEPENDENT_AMBULATORY_CARE_PROVIDER_SITE_OTHER): Payer: BC Managed Care – PPO

## 2014-03-18 VITALS — BP 124/78 | Ht 61.25 in | Wt 194.0 lb

## 2014-03-18 DIAGNOSIS — R102 Pelvic and perineal pain: Secondary | ICD-10-CM

## 2014-03-18 DIAGNOSIS — Z1272 Encounter for screening for malignant neoplasm of vagina: Secondary | ICD-10-CM

## 2014-03-18 DIAGNOSIS — N949 Unspecified condition associated with female genital organs and menstrual cycle: Secondary | ICD-10-CM

## 2014-03-18 DIAGNOSIS — R1900 Intra-abdominal and pelvic swelling, mass and lump, unspecified site: Secondary | ICD-10-CM | POA: Insufficient documentation

## 2014-03-18 DIAGNOSIS — N8300 Follicular cyst of ovary, unspecified side: Secondary | ICD-10-CM

## 2014-03-18 DIAGNOSIS — N831 Corpus luteum cyst of ovary, unspecified side: Secondary | ICD-10-CM

## 2014-03-18 DIAGNOSIS — N83201 Unspecified ovarian cyst, right side: Secondary | ICD-10-CM

## 2014-03-18 DIAGNOSIS — N83209 Unspecified ovarian cyst, unspecified side: Secondary | ICD-10-CM

## 2014-03-18 DIAGNOSIS — Z01419 Encounter for gynecological examination (general) (routine) without abnormal findings: Secondary | ICD-10-CM | POA: Insufficient documentation

## 2014-03-18 DIAGNOSIS — Z1151 Encounter for screening for human papillomavirus (HPV): Secondary | ICD-10-CM | POA: Insufficient documentation

## 2014-03-18 MED ORDER — OXYCODONE-ACETAMINOPHEN 5-325 MG PO TABS: 1.0000 | ORAL_TABLET | ORAL | Status: DC | PRN

## 2014-03-18 NOTE — Patient Instructions (Addendum)
Transvaginal Ultrasound Transvaginal ultrasound is a pelvic ultrasound, using a metal probe that is placed in the vagina, to look at a women's female organs. Transvaginal ultrasound is a method of seeing inside the pelvis of a woman. The ultrasound machine sends out sound waves from the transducer (probe). These sound waves bounce off body structures (like an echo) to create a picture. The picture shows up on a monitor. It is called transvaginal because the probe is inserted into the vagina. There should be very little discomfort from the vaginal probe. This test can also be used during pregnancy. Endovaginal ultrasound is another name for a transvaginal ultrasound. In a transabdominal ultrasound, the probe is placed on the outside of the belly. This method gives pictures that are lower quality than pictures from the transvaginal technique. Transvaginal ultrasound is used to look for problems of the female genital tract. Some such problems include:  Infertility problems.  Congenital (birth defect) malformations of the uterus and ovaries.  Tumors in the uterus.  Abnormal bleeding.  Ovarian tumors and cysts.  Abscess (inflamed tissue around pus) in the pelvis.  Unexplained abdominal or pelvic pain.  Pelvic infection. DURING PREGNANCY, TRANSVAGINAL ULTRASOUND MAY BE USED TO LOOK AT:  Normal pregnancy.  Ectopic pregnancy (pregnancy outside the uterus).  Fetal heartbeat.  Abnormalities in the pelvis, that are not seen well with transabdominal ultrasound.  Suspected twins or multiples.  Impending miscarriage.  Problems with the cervix (incompetent cervix, not able to stay closed and hold the baby).  When doing an amniocentesis (removing fluid from the pregnancy sac, for testing).  Looking for abnormalities of the baby.  Checking the growth, development, and age of the fetus.  Measuring the amount of fluid in the amniotic sac.  When doing an external version of the baby (moving  baby into correct position).  Evaluating the baby for problems in high risk pregnancies (biophysical profile).  Suspected fetal demise (death). Sometimes a special ultrasound method called Saline Infusion Sonography (SIS) is used for a more accurate look at the uterus. Sterile saline (salt water) is injected into the uterus of non-pregnant patients to see the inside of the uterus better. SIS is not used on pregnant women. The vaginal probe can also assist in obtaining biopsies of abnormal areas, in draining fluid from cysts on the ovary, and in finding IUDs (intrauterine device, birth control) that cannot be located. PREPARATION FOR TEST A transvaginal ultrasound is done with the bladder empty. The transabdominal ultrasound is done with your bladder full. You may be asked to drink several glasses of water before that exam. Sometimes, a transabdominal ultrasound is done just after a transvaginal ultrasound, to look at organs in your abdomen. PROCEDURE  You will lie down on a table, with your knees bent and your feet in foot holders. The probe is covered with a condom. A sterile lubricant is put into the vagina and on the probe. The lubricant helps transmit the sound waves and avoid irritating the vagina. Your caregiver will move the probe inside the vaginal cavity to scan the pelvic structures. A normal test will show a normal pelvis and normal contents. An abnormal test will show abnormalities of the pelvis, placenta, or baby. ABNORMAL RESULTS MAY BE DUE TO:  Growths or tumors in the:  Uterus.  Ovaries.  Vagina.  Other pelvic structures.  Non-cancerous growths of the uterus and ovaries.  Twisting of the ovary, cutting off blood supply to the ovary (ovarian torsion).  Areas of infection, including:  Pelvic  inflammatory disease.  Abscess in the pelvis.  Locating an IUD. PROBLEMS FOUND IN PREGNANT WOMEN MAY INCLUDE:  Ectopic pregnancy (pregnancy outside the uterus).  Multiple  pregnancies.  Early dilation (opening) of the cervix. This may indicate an incompetent cervix and early delivery.  Impending miscarriage.  Fetal death.  Problems with the placenta, including:  Placenta has grown over the opening of the womb (placenta previa).  Placenta has separated early in the womb (placental abruption).  Placenta grows into the muscle of the uterus (placenta accreta).  Tumors of pregnancy, including gestational trophoblastic disease. This is an abnormal pregnancy, with no fetus. The uterus is filled with many grape-like cysts that could sometimes be cancerous.  Incorrect position of the fetus (breech, vertex).  Intrauterine fetal growth retardation (IUGR) (poor growth in the womb).  Fetal abnormalities or infection. RISKS AND COMPLICATIONS There are no known risks to the ultrasound procedure. There is no X-ray used when doing an ultrasound. Document Released: 11/28/2004 Document Revised: 03/10/2012 Document Reviewed: 11/16/2009 [facility] Patient Information 2014 [place], [place]. Ovarian Cyst An ovarian cyst is a fluid-filled sac that forms on an ovary. The ovaries are small organs that produce eggs in women. Various types of cysts can form on the ovaries. Most are not cancerous. Many do not cause problems, and they often go away on their own. Some may cause symptoms and require treatment. Common types of ovarian cysts include: Functional cysts These cysts may occur every month during the menstrual cycle. This is normal. The cysts usually go away with the next menstrual cycle if the woman does not get pregnant. Usually, there are no symptoms with a functional cyst. Endometrioma cysts These cysts form from the tissue that lines the uterus. They are also called "chocolate cysts" because they become filled with blood that turns brown. This type of cyst can cause pain in the lower abdomen during intercourse and with your menstrual period. Cystadenoma cysts This type  develops from the cells on the outside of the ovary. These cysts can get very big and cause lower abdomen pain and pain with intercourse. This type of cyst can twist on itself, cut off its blood supply, and cause severe pain. It can also easily rupture and cause a lot of pain. Dermoid cysts This type of cyst is sometimes found in both ovaries. These cysts may contain different kinds of body tissue, such as skin, teeth, hair, or cartilage. They usually do not cause symptoms unless they get very big. Theca lutein cysts These cysts occur when too much of a certain hormone (human chorionic gonadotropin) is produced and overstimulates the ovaries to produce an egg. This is most common after procedures used to assist with the conception of a baby (in vitro fertilization). CAUSES  Fertility drugs can cause a condition in which multiple large cysts are formed on the ovaries. This is called ovarian hyperstimulation syndrome. A condition called polycystic ovary syndrome can cause hormonal imbalances that can lead to nonfunctional ovarian cysts. SIGNS AND SYMPTOMS  Many ovarian cysts do not cause symptoms. If symptoms are present, they may include: Pelvic pain or pressure. Pain in the lower abdomen. Pain during sexual intercourse. Increasing girth (swelling) of the abdomen. Abnormal menstrual periods. Increasing pain with menstrual periods. Stopping having menstrual periods without being pregnant. DIAGNOSIS  These cysts are commonly found during a routine or annual pelvic exam. Tests may be ordered to find out more about the cyst. These tests may include: Ultrasound. X-ray of the pelvis. CT scan. MRI. Blood  tests. TREATMENT  Many ovarian cysts go away on their own without treatment. Your health care provider may want to check your cyst regularly for 2 3 months to see if it changes. For women in menopause, it is particularly important to monitor a cyst closely because of the higher rate of ovarian cancer  in menopausal women. When treatment is needed, it may include any of the following: A procedure to drain the cyst (aspiration). This may be done using a long needle and ultrasound. It can also be done through a laparoscopic procedure. This involves using a thin, lighted tube with a tiny camera on the end (laparoscope) inserted through a small incision. Surgery to remove the whole cyst. This may be done using laparoscopic surgery or an open surgery involving a larger incision in the lower abdomen. Hormone treatment or birth control pills. These methods are sometimes used to help dissolve a cyst. HOME CARE INSTRUCTIONS  Only take over-the-counter or prescription medicines as directed by your health care provider. Follow up with your health care provider as directed. Get regular pelvic exams and Pap tests. SEEK MEDICAL CARE IF:  Your periods are late, irregular, or painful, or they stop. Your pelvic pain or abdominal pain does not go away. Your abdomen becomes larger or swollen. You have pressure on your bladder or trouble emptying your bladder completely. You have pain during sexual intercourse. You have feelings of fullness, pressure, or discomfort in your stomach. You lose weight for no apparent reason. You feel generally ill. You become constipated. You lose your appetite. You develop acne. You have an increase in body and facial hair. You are gaining weight, without changing your exercise and eating habits. You think you are pregnant. SEEK IMMEDIATE MEDICAL CARE IF:  You have increasing abdominal pain. You feel sick to your stomach (nauseous), and you throw up (vomit). You develop a fever that comes on suddenly. You have abdominal pain during a bowel movement. Your menstrual periods become heavier than usual. Document Released: 12/17/2005 Document Revised: 10/07/2013 Document Reviewed: 08/24/2013 [facility] Patient Information 2014 [place].

## 2014-03-18 NOTE — Progress Notes (Addendum)
Patient presented to the office today as a referral from Dr. Birdie Riddle in reference into a left adnexal mass. Patient had been evaluated for suspected urinary tract infections and she had blood in her urine. She was started on ampicillin as it waited for cultures. She had a CT scan to rule out the possibility of ureteral lithiasis and was ruled out and the findings of an ovarian cyst was described as follows:  Along the course of the ureters no stones are evident. The  urinary bladder is partially distended and grossly normal. There is  a cystic left-sided pelvic process measuring 4 cm AP x 4.7 cm  transversely x 5 cm in superior to inferior dimension with HU  density of +30. There is solid-appearing tissue associated with it  likely reflecting the left ovary. The uterus is surgically absent.  No right adnexal mass is demonstrated. There is no free fluid within  the pelvis. No abnormalities and the appendix or the rest are abdominal pelvic CT scan.  Patient stated that in the late 80s Dr. Serita Butcher urologist had operated on her for problem with her kidney that she does not recall the specifics and for this reason we will try to obtain records. She has a midline incision as well from a cholecystectomy, hysterectomy with right salpingo-oophorectomy for large adnexal mass which patient stated was benign and had oncological consultation. She also has a Pfannenstiel incision from her prior cesarean section.  Before patient's hysterectomy she had had a cervical conization for carcinoma in situ.  Her PCP has done well her blood work but no Pap smear so will do once a day.  Exam: Mild left CVA tenderness Abdomen 3 scars were noted the first one Pfannenstiel cycle and midline and a third one right mid upper abdomen towards flank. Pelvic: Bartholin urethra Skene glands within normal limits Vagina: Vaginal cuff intact Pap smear was done On manual exam tenderness in the left lower quadrant due to  patient's obesity it was difficult to delineate a pelvic mass so for this reason an ultrasound will be done today to specifically determine the size and consistency of the cyst that was noted on CT scan. Rectal exam demonstrated no tenderness and no indurated rectovaginal septum.  Ultrasound today: A thin-walled cysts with particular echo pattern and thin septations was noted measuring 20 x 30 x 26 mm with average size 28 mm avascular. Arterial blood flow was seen in the left ovary. A small echo free follicle measuring 12 mm was noted. Right adnexa negative and evidence of prior hysterectomy was noted.  Assessment/plan: Right ovarian cyst small in size 2.8 cm average with a thin septation and avascular has decreased in size over the past 10 days in comparison to CT findings. The CT scan demonstrated no abdominal pelvic lymphadenopathy. It appears this could be a hemorrhagic cyst resolving. We will do an oval 1 ovarian cancer screening blood test. Patient opted not to receive Depo-Provera injection that I was recommending to further see the response to this cyst to see if he continues to decrease but patient refused and stated she took her many years ago and gaining weight. She has opted to follow up with an ultrasound in 3 months. Prescription refill for Lortab was provided. We will contact her when the results of the blood test becomes available. A Pap smear was done today.  Just recently reviewed operative note from Dr. Cheri Fowler who operated on the patient on 09/23/2012 as a result the patient having pelvic  pain and he had gone a laparoscopy with extensive lysis of adhesions. His findings were as follows: "She had omentum adherent to the anterior dome a wall beneath the umbilicus. Uterus and right ovary were surgically after. There were significant adhesions of the omentum and bowel to the pelvic brim covering the left ovary. Once the left tube and ovary were exposed they appeared fairly normal"

## 2014-03-25 LAB — OVARIAN MALIGNANCY RISK-ROMA
CA125: 32 U/mL (ref <35)
HE4: 54 pM (ref <151)
ROMA PREMENOPAUSAL: 0.92 (ref <1.31)
ROMA Postmenopausal: 1.97 (ref <2.77)

## 2014-03-30 ENCOUNTER — Telehealth: Payer: Self-pay | Admitting: *Deleted

## 2014-03-30 NOTE — Telephone Encounter (Signed)
Pt was seen on 03/18/14 OV for Right ovarian cyst given Rx for lortab to help with pain. Pt states last night she had very painful experience with right cyst, pt said hurt to touch at times. I offered OV pt declined, states money issues with copay. Is taking pain medication states pain was not this bad before. She does not want any more pain medication, asking if cyst has possible ruptured? Once again I explained it hard to diagnose via phone. Please advise

## 2014-03-30 NOTE — Telephone Encounter (Signed)
Left the below on pt voicemail and to call if she would like motrin sent pharmacy.

## 2014-03-30 NOTE — Telephone Encounter (Signed)
As discussed before it appears to be a small involuting cyst which may be contributing to her discomfort. She can take Motrin 800 mg 3 times a day in between the Lortab that had previously been prescribed. She opted not to receive a Depo-Provera injection. She was to follow up for an ultrasound in 3 months. Reassure patient.

## 2014-05-05 ENCOUNTER — Telehealth: Payer: Self-pay | Admitting: *Deleted

## 2014-05-05 MED ORDER — IBUPROFEN 800 MG PO TABS: 800.0000 mg | ORAL_TABLET | Freq: Three times a day (TID) | ORAL | Status: DC | PRN

## 2014-05-05 NOTE — Telephone Encounter (Signed)
rx sent, pt informed.  

## 2014-05-05 NOTE — Telephone Encounter (Signed)
Motrin 800 mg tid prn # 30 refill x 3

## 2014-05-05 NOTE — Telephone Encounter (Signed)
Pt called requesting Rx for motrin 800 mg for Right ovarian cyst had some painful experiences this am. Please advise

## 2014-06-04 ENCOUNTER — Other Ambulatory Visit: Payer: Self-pay | Admitting: Family Medicine

## 2014-06-04 DIAGNOSIS — Z1231 Encounter for screening mammogram for malignant neoplasm of breast: Secondary | ICD-10-CM

## 2014-06-04 NOTE — Telephone Encounter (Signed)
Med filled and faxed.  

## 2014-06-04 NOTE — Telephone Encounter (Signed)
Last OV 03-05-14 Med filled 03-05-14 #30 with 1

## 2014-06-11 ENCOUNTER — Ambulatory Visit (HOSPITAL_BASED_OUTPATIENT_CLINIC_OR_DEPARTMENT_OTHER)
Admission: RE | Admit: 2014-06-11 | Discharge: 2014-06-11 | Disposition: A | Discharge status: Home / Self Care | Payer: BC Managed Care – PPO | Source: Ambulatory Visit | Attending: Family Medicine | Admitting: Family Medicine

## 2014-06-11 DIAGNOSIS — Z1231 Encounter for screening mammogram for malignant neoplasm of breast: Secondary | ICD-10-CM

## 2014-06-14 ENCOUNTER — Other Ambulatory Visit: Payer: Self-pay | Admitting: Family Medicine

## 2014-06-14 NOTE — Telephone Encounter (Signed)
Med phoned in °

## 2014-06-16 ENCOUNTER — Other Ambulatory Visit: Payer: BC Managed Care – PPO

## 2014-06-16 ENCOUNTER — Ambulatory Visit: Payer: BC Managed Care – PPO | Admitting: Gynecology

## 2014-07-06 ENCOUNTER — Telehealth: Payer: Self-pay

## 2014-07-06 NOTE — Telephone Encounter (Signed)
error 

## 2014-07-06 NOTE — Telephone Encounter (Signed)
Patient called in voice mail stating she has cyst on her ovary and it has gone from occasional pain to constant pain. I called her back and got her voice mail and left message that she needs to make appointment to come in. I reminded her that at 03/18/14 visit Dr. Moshe Salisbury recommended an u/s in 3 mos to follow up on the cyst and I did not see she had scheduled that.  She should call and schedule that and a visit to see Dr. Moshe Salisbury.

## 2014-07-09 ENCOUNTER — Ambulatory Visit (HOSPITAL_BASED_OUTPATIENT_CLINIC_OR_DEPARTMENT_OTHER): Payer: BC Managed Care – PPO

## 2014-07-12 ENCOUNTER — Ambulatory Visit (HOSPITAL_BASED_OUTPATIENT_CLINIC_OR_DEPARTMENT_OTHER)
Admission: RE | Admit: 2014-07-12 | Discharge: 2014-07-12 | Disposition: A | Discharge status: Home / Self Care | Payer: BC Managed Care – PPO | Source: Ambulatory Visit | Attending: Family Medicine | Admitting: Family Medicine

## 2014-07-12 DIAGNOSIS — Z1231 Encounter for screening mammogram for malignant neoplasm of breast: Secondary | ICD-10-CM | POA: Insufficient documentation

## 2014-07-13 ENCOUNTER — Encounter (HOSPITAL_COMMUNITY): Payer: Self-pay | Admitting: Emergency Medicine

## 2014-07-13 ENCOUNTER — Emergency Department (HOSPITAL_COMMUNITY): Payer: BC Managed Care – PPO

## 2014-07-13 ENCOUNTER — Emergency Department (HOSPITAL_COMMUNITY)
Admission: EM | Admit: 2014-07-13 | Discharge: 2014-07-13 | Disposition: A | Discharge status: Home / Self Care | Payer: BC Managed Care – PPO | Attending: Emergency Medicine | Admitting: Emergency Medicine

## 2014-07-13 DIAGNOSIS — F411 Generalized anxiety disorder: Secondary | ICD-10-CM | POA: Insufficient documentation

## 2014-07-13 DIAGNOSIS — Z872 Personal history of diseases of the skin and subcutaneous tissue: Secondary | ICD-10-CM | POA: Insufficient documentation

## 2014-07-13 DIAGNOSIS — M5432 Sciatica, left side: Secondary | ICD-10-CM

## 2014-07-13 DIAGNOSIS — M545 Low back pain, unspecified: Secondary | ICD-10-CM | POA: Insufficient documentation

## 2014-07-13 DIAGNOSIS — M5430 Sciatica, unspecified side: Secondary | ICD-10-CM | POA: Insufficient documentation

## 2014-07-13 DIAGNOSIS — Z8719 Personal history of other diseases of the digestive system: Secondary | ICD-10-CM | POA: Insufficient documentation

## 2014-07-13 DIAGNOSIS — N189 Chronic kidney disease, unspecified: Secondary | ICD-10-CM | POA: Insufficient documentation

## 2014-07-13 DIAGNOSIS — R51 Headache: Secondary | ICD-10-CM | POA: Insufficient documentation

## 2014-07-13 DIAGNOSIS — N83209 Unspecified ovarian cyst, unspecified side: Secondary | ICD-10-CM | POA: Insufficient documentation

## 2014-07-13 DIAGNOSIS — J45909 Unspecified asthma, uncomplicated: Secondary | ICD-10-CM | POA: Insufficient documentation

## 2014-07-13 DIAGNOSIS — F17200 Nicotine dependence, unspecified, uncomplicated: Secondary | ICD-10-CM | POA: Insufficient documentation

## 2014-07-13 DIAGNOSIS — Z880 Allergy status to penicillin: Secondary | ICD-10-CM | POA: Insufficient documentation

## 2014-07-13 DIAGNOSIS — N83202 Unspecified ovarian cyst, left side: Secondary | ICD-10-CM

## 2014-07-13 LAB — CBC WITH DIFFERENTIAL/PLATELET
BASOS ABS: 0.1 10*3/uL (ref 0.0–0.1)
Basophils Relative: 0 % (ref 0–1)
EOS ABS: 0.5 10*3/uL (ref 0.0–0.7)
EOS PCT: 4 % (ref 0–5)
HEMATOCRIT: 46.3 % — AB (ref 36.0–46.0)
Hemoglobin: 15.8 g/dL — ABNORMAL HIGH (ref 12.0–15.0)
LYMPHS PCT: 35 % (ref 12–46)
Lymphs Abs: 4.1 10*3/uL — ABNORMAL HIGH (ref 0.7–4.0)
MCH: 31.5 pg (ref 26.0–34.0)
MCHC: 34.1 g/dL (ref 30.0–36.0)
MCV: 92.4 fL (ref 78.0–100.0)
Monocytes Absolute: 0.5 10*3/uL (ref 0.1–1.0)
Monocytes Relative: 4 % (ref 3–12)
Neutro Abs: 6.8 10*3/uL (ref 1.7–7.7)
Neutrophils Relative %: 57 % (ref 43–77)
Platelets: 225 10*3/uL (ref 150–400)
RBC: 5.01 MIL/uL (ref 3.87–5.11)
RDW: 12.7 % (ref 11.5–15.5)
WBC: 11.9 10*3/uL — ABNORMAL HIGH (ref 4.0–10.5)

## 2014-07-13 LAB — I-STAT CHEM 8, ED
BUN: 5 mg/dL — AB (ref 6–23)
CALCIUM ION: 1.16 mmol/L (ref 1.12–1.23)
Chloride: 104 mEq/L (ref 96–112)
Creatinine, Ser: 0.8 mg/dL (ref 0.50–1.10)
Glucose, Bld: 143 mg/dL — ABNORMAL HIGH (ref 70–99)
HCT: 50 % — ABNORMAL HIGH (ref 36.0–46.0)
Hemoglobin: 17 g/dL — ABNORMAL HIGH (ref 12.0–15.0)
Potassium: 3.5 mEq/L — ABNORMAL LOW (ref 3.7–5.3)
Sodium: 140 mEq/L (ref 137–147)
TCO2: 22 mmol/L (ref 0–100)

## 2014-07-13 LAB — URINALYSIS, ROUTINE W REFLEX MICROSCOPIC
BILIRUBIN URINE: NEGATIVE
GLUCOSE, UA: NEGATIVE mg/dL
KETONES UR: NEGATIVE mg/dL
Leukocytes, UA: NEGATIVE
Nitrite: NEGATIVE
Protein, ur: NEGATIVE mg/dL
Specific Gravity, Urine: 1.01 (ref 1.005–1.030)
Urobilinogen, UA: 0.2 mg/dL (ref 0.0–1.0)
pH: 5.5 (ref 5.0–8.0)

## 2014-07-13 LAB — URINE MICROSCOPIC-ADD ON

## 2014-07-13 MED ORDER — OXYCODONE-ACETAMINOPHEN 5-325 MG PO TABS
1.0000 | ORAL_TABLET | Freq: Once | ORAL | Status: AC
  Administered 2014-07-13: 1 via ORAL
  Filled 2014-07-13: qty 1

## 2014-07-13 MED ORDER — PREDNISONE 20 MG PO TABS: ORAL_TABLET | ORAL | Status: DC

## 2014-07-13 MED ORDER — METHOCARBAMOL 500 MG PO TABS: 500.0000 mg | ORAL_TABLET | Freq: Two times a day (BID) | ORAL | Status: DC

## 2014-07-13 MED ORDER — HYDROMORPHONE HCL PF 1 MG/ML IJ SOLN
1.0000 mg | Freq: Once | INTRAMUSCULAR | Status: AC
  Administered 2014-07-13: 1 mg via INTRAMUSCULAR
  Filled 2014-07-13: qty 1

## 2014-07-13 MED ORDER — OXYCODONE-ACETAMINOPHEN 5-325 MG PO TABS: 1.0000 | ORAL_TABLET | ORAL | Status: DC | PRN

## 2014-07-13 NOTE — ED Provider Notes (Signed)
CSN: [id number]     Arrival date & time 07/13/14  1322 History   First MD Initiated Contact with Patient 07/13/14 1341     Chief Complaint  Patient presents with  . Back Pain     (Consider location/radiation/quality/duration/timing/severity/associated sxs/prior Treatment) HPI Donna Sullivan is a 48 y.o. female who presents emergency department complaining of back pain. She states pain is in the left lower back radiates around left lower abdomen. States started in 4 months ago. Has history of left ovarian cysts and states she has been followed by her GYN doctor, and her primary care Dr. Patient states that she last saw her GYN Dr. 2 months ago, at that time she had  left ovarian cyst there they were watching. States she called them to see if they can call in her pain medications since pain was worsening now, however they were only able to call and ibuprofen without seeing her. Patient states that she does not have money currently to see her OB/GYN. Patient denies any fever or chills. She denies any nausea, vomiting, changes in bowels. No urinary symptoms. States she has been taking ibuprofen at home with no relief. She patient status post right oophorectomy and hysterectomy. Her pain is worsened with movement and palpation of her abdomen. Pain is worsened with left leg movement. Nothing makes her pain better. Pain described as sharp. No other complaints.  Past Medical History  Diagnosis Date  . Discoid lupus     Dr Ronnald Ramp, dermatologist  . Anxiety   . Asthma   . GERD (gastroesophageal reflux disease)   . Chronic kidney disease 1985    BLOCKED TUBE IN KIDNEY  . Seizures     EPILEPSY- NO SEIZURES SINCE AGE 74  . Headache(784.0)     MIGRAINES  . Insomnia   . Tendonitis    Past Surgical History  Procedure Laterality Date  . Appendectomy    . Kidney surgery      right, straightened tube  . Right oophorectomy    . Ankle reconstruction  04/2009  . Other surgical history      Hysterectomy with  10cm grown removal, Dr. Willis Modena  . Abdominal hysterectomy    . Cesarean section    . Cholecystectomy    . Diagnostic laparoscopy    . Laparoscopy  09/23/2012    Procedure: LAPAROSCOPY OPERATIVE;  Surgeon: Cheri Fowler, MD;  Location: [facility];  Service: Gynecology;  Laterality: N/A;   Family History  Problem Relation Age of Onset  . Coronary artery disease Mother   . Hypertension Mother   . Hypertension Father   . Colon cancer      grandfather   History  Substance Use Topics  . Smoking status: Current Every Day Smoker -- 0.50 packs/day    Types: Cigarettes  . Smokeless tobacco: Never Used     Comment: Maybe later  . Alcohol Use: Yes     Comment: OCCASIONAL DRINK   OB History   Grav Para Term Preterm Abortions TAB SAB Ect Mult Living   3 1   2     1      Review of Systems  Constitutional: Negative for fever and chills.  Respiratory: Negative for cough, chest tightness and shortness of breath.   Cardiovascular: Negative for chest pain, palpitations and leg swelling.  Gastrointestinal: Positive for abdominal pain. Negative for nausea, vomiting and diarrhea.  Genitourinary: Positive for pelvic pain. Negative for dysuria, flank pain, vaginal bleeding, vaginal discharge and vaginal pain.  Musculoskeletal: Positive  for arthralgias and back pain. Negative for myalgias, neck pain and neck stiffness.  Skin: Negative for rash.  Neurological: Negative for dizziness, weakness, numbness and headaches.  All other systems reviewed and are negative.     Allergies  Tramadol; Amoxicillin-pot clavulanate; Bactrim; Ciprofloxacin hcl; Doxycycline; Keflex; and Wellbutrin  Home Medications   Prior to Admission medications   Medication Sig Start Date End Date Taking? Authorizing Provider  albuterol (PROVENTIL HFA;VENTOLIN HFA) 108 (90 BASE) MCG/ACT inhaler Inhale 1-2 puffs into the lungs every 6 (six) hours as needed for wheezing or shortness of breath.   Yes Historical Provider, MD   ibuprofen (ADVIL,MOTRIN) 800 MG tablet Take 800 mg by mouth every 8 (eight) hours as needed for mild pain.   Yes Historical Provider, MD  LORazepam (ATIVAN) 0.5 MG tablet Take 0.5 mg by mouth 2 (two) times daily as needed for anxiety.   Yes Historical Provider, MD  oxyCODONE-acetaminophen (PERCOCET/ROXICET) 5-325 MG per tablet Take 0.5-1 tablets by mouth every 4 (four) hours as needed for severe pain.   Yes Historical Provider, MD   BP 123/72  Pulse 68  Temp(Src) 97.9 F (36.6 C) (Oral)  Resp 17  Ht 5' 1.75" (1.568 m)  Wt 198 lb (89.812 kg)  BMI 36.53 kg/m2  SpO2 95% Physical Exam  Nursing note and vitals reviewed. Constitutional: She is oriented to person, place, and time. She appears well-developed and well-nourished. No distress.  HENT:  Head: Normocephalic.  Eyes: Conjunctivae are normal.  Neck: Neck supple.  Cardiovascular: Normal rate, regular rhythm and normal heart sounds.   Pulmonary/Chest: Effort normal and breath sounds normal. No respiratory distress. She has no wheezes. She has no rales.  Abdominal: Soft. Bowel sounds are normal. She exhibits no distension. There is tenderness. There is no rebound.  Left lower quadrant tenderness  Musculoskeletal: She exhibits no edema.  Tender over left SI joint. No midline lumbar spine tenderness. Tenderness extends into the left posterior buttock. Pain with straight left leg raise. Dorsal pedal pulses intact and equal bilaterally. Sensation intact in bilateral lower extremities. 5 out of 5 and equal lower extremity strength bilaterally.  Neurological: She is alert and oriented to person, place, and time.  Skin: Skin is warm and dry.  Psychiatric: She has a normal mood and affect. Her behavior is normal.    ED Course  Procedures (including critical care time) Labs Review Labs Reviewed  URINALYSIS, ROUTINE W REFLEX MICROSCOPIC - Abnormal; Notable for the following:    APPearance CLOUDY (*)    Hgb urine dipstick TRACE (*)    All  other components within normal limits  CBC WITH DIFFERENTIAL - Abnormal; Notable for the following:    WBC 11.9 (*)    Hemoglobin 15.8 (*)    HCT 46.3 (*)    Lymphs Abs 4.1 (*)    All other components within normal limits  URINE MICROSCOPIC-ADD ON - Abnormal; Notable for the following:    Squamous Epithelial / LPF MANY (*)    All other components within normal limits  I-STAT CHEM 8, ED - Abnormal; Notable for the following:    Potassium 3.5 (*)    BUN 5 (*)    Glucose, Bld 143 (*)    Hemoglobin 17.0 (*)    HCT 50.0 (*)    All other components within normal limits    Imaging Review Mm Digital Screening Bilateral  07/13/2014   CLINICAL DATA:  Screening.  EXAM: DIGITAL SCREENING BILATERAL MAMMOGRAM WITH CAD  COMPARISON:  Previous exam(s).  ACR Breast Density Category b: There are scattered areas of fibroglandular density.  FINDINGS: There are no findings suspicious for malignancy. Images were processed with CAD.  IMPRESSION: No mammographic evidence of malignancy. A result letter of this screening mammogram will be mailed directly to the patient.  RECOMMENDATION: Screening mammogram in one year. (Code:SM-B-01Y)  BI-RADS CATEGORY  1: Negative.   Electronically Signed   By: Conchita Paris M.D.   On: 07/13/2014 11:47     EKG Interpretation None      MDM   Final diagnoses:  None     patient with history of left ovarian cysts presents to the ER with increased pain in her left lower back, which she states feels like when she had prior ovarian cyst pain. Pain does radiate to the left lower abdomen. Pain is also reproducible with left straight leg raise and movement. She is neurovascularly intact, no signs of cauda equina. Will get labs. Will get ultrasound to evaluate for worsening ovarian cyst. Percocet ordered for pain.    4:42 PM Pt signed out at shift change pending [place] of pelvis. Pt is in NAD, comfortable appearing. If cyst is not enlarged, will need outpatient follow up.   Filed  Vitals:   07/13/14 1330  BP: 123/72  Pulse: 68  Temp: 97.9 F (36.6 C)  TempSrc: Oral  Resp: 17  Height: 5' 1.75" (1.568 m)  Weight: 198 lb (89.812 kg)  SpO2: 95%     Renold Genta, PA-C 07/14/14 [phone]

## 2014-07-13 NOTE — ED Provider Notes (Signed)
Medical screening examination/treatment/procedure(s) were performed by non-physician practitioner and as supervising physician I was immediately available for consultation/collaboration.   EKG Interpretation None        Hoy Morn, MD 07/13/14 1729

## 2014-07-13 NOTE — ED Notes (Signed)
Pt returned from US

## 2014-07-13 NOTE — Discharge Instructions (Signed)
1. Medications: robaxin, percocet, ibuprofen, prednisone, usual home medications 2. Treatment: rest, drink plenty of fluids, gentle stretching as discussed, alternate ice and heat 3. Follow Up: Please followup with your primary doctor for discussion of your diagnoses and further evaluation after today's visit; if you do not have a primary care doctor use the resource guide provided to find one;    Back Exercises Back exercises help treat and prevent back injuries. The goal of back exercises is to increase the strength of your abdominal and back muscles and the flexibility of your back. These exercises should be started when you no longer have back pain. Back exercises include:  Pelvic Tilt. Lie on your back with your knees bent. Tilt your pelvis until the lower part of your back is against the floor. Hold this position 5 to 10 sec and repeat 5 to 10 times.  Knee to Chest. Pull first 1 knee up against your chest and hold for 20 to 30 seconds, repeat this with the other knee, and then both knees. This may be done with the other leg straight or bent, whichever feels better.  Sit-Ups or Curl-Ups. Bend your knees 90 degrees. Start with tilting your pelvis, and do a partial, slow sit-up, lifting your trunk only 30 to 45 degrees off the floor. Take at least 2 to 3 seconds for each sit-up. Do not do sit-ups with your knees out straight. If partial sit-ups are difficult, simply do the above but with only tightening your abdominal muscles and holding it as directed.  Hip-Lift. Lie on your back with your knees flexed 90 degrees. Push down with your feet and shoulders as you raise your hips a couple inches off the floor; hold for 10 seconds, repeat 5 to 10 times.  Back arches. Lie on your stomach, propping yourself up on bent elbows. Slowly press on your hands, causing an arch in your low back. Repeat 3 to 5 times. Any initial stiffness and discomfort should lessen with repetition over time.  Shoulder-Lifts.  Lie face down with arms beside your body. Keep hips and torso pressed to floor as you slowly lift your head and shoulders off the floor. Do not overdo your exercises, especially in the beginning. Exercises may cause you some mild back discomfort which lasts for a few minutes; however, if the pain is more severe, or lasts for more than 15 minutes, do not continue exercises until you see your caregiver. Improvement with exercise therapy for back problems is slow.  See your caregivers for assistance with developing a proper back exercise program. Document Released: 01/24/2005 Document Revised: 03/10/2012 Document Reviewed: 10/18/2011 [facility] Patient Information 2015 [place], [place]. This information is not intended to replace advice given to you by your health care provider. Make sure you discuss any questions you have with your health care provider.   Sciatica Sciatica is pain, weakness, numbness, or tingling along the path of the sciatic nerve. The nerve starts in the lower back and runs down the back of each leg. The nerve controls the muscles in the lower leg and in the back of the knee, while also providing sensation to the back of the thigh, lower leg, and the sole of your foot. Sciatica is a symptom of another medical condition. For instance, nerve damage or certain conditions, such as a herniated disk or bone spur on the spine, pinch or put pressure on the sciatic nerve. This causes the pain, weakness, or other sensations normally associated with sciatica. Generally, sciatica only affects  one side of the body. CAUSES   Herniated or slipped disc.  Degenerative disk disease.  A pain disorder involving the narrow muscle in the buttocks (piriformis syndrome).  Pelvic injury or fracture.  Pregnancy.  Tumor (rare). SYMPTOMS  Symptoms can vary from mild to very severe. The symptoms usually travel from the low back to the buttocks and down the back of the leg. Symptoms can include:  Mild  tingling or dull aches in the lower back, leg, or hip.  Numbness in the back of the calf or sole of the foot.  Burning sensations in the lower back, leg, or hip.  Sharp pains in the lower back, leg, or hip.  Leg weakness.  Severe back pain inhibiting movement. These symptoms may get worse with coughing, sneezing, laughing, or prolonged sitting or standing. Also, being overweight may worsen symptoms. DIAGNOSIS  Your caregiver will perform a physical exam to look for common symptoms of sciatica. He or she may ask you to do certain movements or activities that would trigger sciatic nerve pain. Other tests may be performed to find the cause of the sciatica. These may include:  Blood tests.  X-rays.  Imaging tests, such as an MRI or CT scan. TREATMENT  Treatment is directed at the cause of the sciatic pain. Sometimes, treatment is not necessary and the pain and discomfort goes away on its own. If treatment is needed, your caregiver may suggest:  Over-the-counter medicines to relieve pain.  Prescription medicines, such as anti-inflammatory medicine, muscle relaxants, or narcotics.  Applying heat or ice to the painful area.  Steroid injections to lessen pain, irritation, and inflammation around the nerve.  Reducing activity during periods of pain.  Exercising and stretching to strengthen your abdomen and improve flexibility of your spine. Your caregiver may suggest losing weight if the extra weight makes the back pain worse.  Physical therapy.  Surgery to eliminate what is pressing or pinching the nerve, such as a bone spur or part of a herniated disk. HOME CARE INSTRUCTIONS   Only take over-the-counter or prescription medicines for pain or discomfort as directed by your caregiver.  Apply ice to the affected area for 20 minutes, 3-4 times a day for the first 48-72 hours. Then try heat in the same way.  Exercise, stretch, or perform your usual activities if these do not aggravate  your pain.  Attend physical therapy sessions as directed by your caregiver.  Keep all follow-up appointments as directed by your caregiver.  Do not wear high heels or shoes that do not provide proper support.  Check your mattress to see if it is too soft. A firm mattress may lessen your pain and discomfort. SEEK IMMEDIATE MEDICAL CARE IF:   You lose control of your bowel or bladder (incontinence).  You have increasing weakness in the lower back, pelvis, buttocks, or legs.  You have redness or swelling of your back.  You have a burning sensation when you urinate.  You have pain that gets worse when you lie down or awakens you at night.  Your pain is worse than you have experienced in the past.  Your pain is lasting longer than 4 weeks.  You are suddenly losing weight without reason. MAKE SURE YOU:  Understand these instructions.  Will watch your condition.  Will get help right away if you are not doing well or get worse. Document Released: 12/11/2001 Document Revised: 06/17/2012 Document Reviewed: 04/27/2012 [facility] Patient Information 2015 [place], [place]. This information is not intended to  replace advice given to you by your health care provider. Make sure you discuss any questions you have with your health care provider.

## 2014-07-13 NOTE — ED Notes (Signed)
Hannah, PA at the bedside.  

## 2014-07-13 NOTE — ED Notes (Signed)
Pt. Stated, I have back pain and I think its due to the cyst on my ovary. This started in March.

## 2014-07-13 NOTE — ED Provider Notes (Signed)
Care assumed from [place], [place].    Donna Sullivan is a 48 y.o. female presents with low back pain.  Hx of ovarian cyst and pt has PCP and OB/GYN.  Pt has been seeing both for the left lower back pain and they are monitoring. Pt has pain to palpation of the SI joint and positive straight leg raise.  Pt also with LLQ abd pain on exam, but no complaints of such.    Physical Exam  BP 123/72  Pulse 68  Temp(Src) 97.9 F (36.6 C) (Oral)  Resp 17  Ht 5' 1.75" (1.568 m)  Wt 198 lb (89.812 kg)  BMI 36.53 kg/m2  SpO2 95%  Physical Exam  Nursing note and vitals reviewed. Constitutional: She appears well-developed and well-nourished. No distress.  HENT:  Head: Normocephalic and atraumatic.  Mouth/Throat: Oropharynx is clear and moist. No oropharyngeal exudate.  Eyes: Conjunctivae are normal.  Neck: Normal range of motion. Neck supple.  Full ROM without pain  Cardiovascular: Normal rate, regular rhythm, normal heart sounds and intact distal pulses.   No murmur heard. Pulmonary/Chest: Effort normal and breath sounds normal. No respiratory distress. She has no wheezes.  Abdominal: Soft. She exhibits no distension. There is no tenderness.  Musculoskeletal:  Full range of motion of the T-spine and L-spine No tenderness to palpation of the spinous processes of the T-spine or L-spine Mild tenderness to palpation of the left paraspinous muscles of the L-spine; left SI joint and left buttock consistently reproducible  Lymphadenopathy:    She has no cervical adenopathy.  Neurological: She is alert. She has normal reflexes.  Speech is clear and goal oriented, follows commands Normal 5/5 strength in upper and lower extremities bilaterally including dorsiflexion and plantar flexion, strong and equal grip strength Sensation normal to light and sharp touch Moves extremities without ataxia, coordination intact Normal gait Normal balance   Skin: Skin is warm and dry. No rash noted. She is not diaphoretic.  No erythema.  Psychiatric: She has a normal mood and affect. Her behavior is normal.    ED Course  Procedures  Plan:  US pelvis pending for  Evaluation of torsion and cyst.  Pt with MSK components as well as ovarian cyst involvement.  If [place] normal pt to be d/c home with pain control and close follow-up.     MDM   5:24 PM  [place] with 14 mm simple left ovarian/parovarian cyst, decreased since last ultrasound. PE weight without evidence of UTI. Patient given Dilaudid IM with improvement in pain. She ambulates without difficulty.  Normal neurological exam, no evidence of urinary incontinence or retention, pain is consistently reproducible. There is no evidence of AAA or concern for dissection at this time.   Patient can walk but states is painful.  No loss of bowel or bladder control.  No concern for cauda equina.  No fever, night sweats, weight loss, h/o cancer, IVDU.  Pain treated here in the department with adequate improvement. RICE protocol and pain medicine indicated and discussed with patient.   I have personally reviewed patient's vitals, nursing note and any pertinent labs or imaging.  I performed an undressed physical exam.    At this time, it has been determined that no acute conditions requiring further emergency intervention. The patient/guardian have been advised of the diagnosis and plan. I reviewed all labs and imaging including any potential incidental findings. We have discussed signs and symptoms that warrant return to the ED, such as loss of bowel or bladder control, gait  disturbance, other concerning symptoms.  Patient/guardian has voiced understanding and agreed to follow-up with the PCP or specialist in 3 days.  Vital signs are stable at discharge.   BP 125/77  Pulse 68  Temp(Src) 97.9 F (36.6 C) (Oral)  Resp 18  Ht 5' 1.75" (1.568 m)  Wt 198 lb (89.812 kg)  BMI 36.53 kg/m2  SpO2 97%            Abigail Butts, PA-C 07/13/14 1726

## 2014-07-13 NOTE — ED Notes (Signed)
Pt states it hurts more when she is standing up and moving around

## 2014-07-13 NOTE — ED Notes (Signed)
Patient transported to Ultrasound 

## 2014-07-15 NOTE — ED Provider Notes (Signed)
Medical screening examination/treatment/procedure(s) were performed by non-physician practitioner and as supervising physician I was immediately available for consultation/collaboration.   EKG Interpretation None       Kerby Hockley, MD 07/15/14 0722 

## 2014-08-11 ENCOUNTER — Other Ambulatory Visit: Payer: Self-pay | Admitting: Family Medicine

## 2014-08-11 NOTE — Telephone Encounter (Signed)
[facility] for #30, 1 refill.  Needs UDS

## 2014-08-11 NOTE — Telephone Encounter (Signed)
Pt notified, med filled at front desk.

## 2014-08-11 NOTE — Telephone Encounter (Signed)
Last OV 03-05-14 Med filled 06-04-14 #30 with 1  Moderate risk/high risk

## 2014-09-02 ENCOUNTER — Encounter: Payer: Self-pay | Admitting: Family Medicine

## 2014-10-15 ENCOUNTER — Telehealth: Payer: Self-pay | Admitting: Family Medicine

## 2014-10-15 NOTE — Telephone Encounter (Signed)
Pt.notified

## 2014-10-15 NOTE — Telephone Encounter (Signed)
Unfortunately unable to call in abx w/o office visit.

## 2014-10-15 NOTE — Telephone Encounter (Signed)
Caller name:Benefiel, Giavanni Relation to pt: self Call back number:[phone]   Reason for call: pt states she has Bronchitis and she does not have money to pay for an office visit pt requesting a Z Pack.

## 2014-10-15 NOTE — Telephone Encounter (Signed)
Pt has not been seen since 03/05/2014.

## 2014-10-27 ENCOUNTER — Other Ambulatory Visit: Payer: Self-pay | Admitting: Family Medicine

## 2014-10-27 NOTE — Telephone Encounter (Signed)
Last OV 03/05/14 Med filled 08/11/14 #30 with 1  High risk, no UDS since 2014 (negative for lorazepam at that time)

## 2014-10-28 MED ORDER — LORAZEPAM 0.5 MG PO TABS: ORAL_TABLET | ORAL | Status: DC

## 2014-10-28 NOTE — Addendum Note (Signed)
Addended by: Kris Hartmann on: 10/28/2014 08:24 AM   Modules accepted: Orders

## 2014-10-28 NOTE — Telephone Encounter (Signed)
Correction. Rx placed at front desk for pt to pick up.

## 2014-10-28 NOTE — Telephone Encounter (Signed)
Med filled and faxed.  

## 2014-11-01 ENCOUNTER — Encounter (HOSPITAL_COMMUNITY): Payer: Self-pay | Admitting: Emergency Medicine

## 2014-12-01 ENCOUNTER — Encounter: Payer: Self-pay | Admitting: Physician Assistant

## 2014-12-01 ENCOUNTER — Ambulatory Visit (INDEPENDENT_AMBULATORY_CARE_PROVIDER_SITE_OTHER): Payer: BC Managed Care – PPO | Admitting: Physician Assistant

## 2014-12-01 VITALS — BP 127/66 | HR 78 | Temp 98.4°F | Resp 16 | Ht 61.5 in | Wt 199.0 lb

## 2014-12-01 DIAGNOSIS — B9689 Other specified bacterial agents as the cause of diseases classified elsewhere: Secondary | ICD-10-CM

## 2014-12-01 DIAGNOSIS — J449 Chronic obstructive pulmonary disease, unspecified: Secondary | ICD-10-CM

## 2014-12-01 DIAGNOSIS — J019 Acute sinusitis, unspecified: Secondary | ICD-10-CM

## 2014-12-01 DIAGNOSIS — R52 Pain, unspecified: Secondary | ICD-10-CM

## 2014-12-01 DIAGNOSIS — R6883 Chills (without fever): Secondary | ICD-10-CM

## 2014-12-01 LAB — POCT INFLUENZA A/B
Influenza A, POC: NEGATIVE
Influenza B, POC: NEGATIVE

## 2014-12-01 MED ORDER — BUDESONIDE-FORMOTEROL FUMARATE 160-4.5 MCG/ACT IN AERO: 2.0000 | INHALATION_SPRAY | Freq: Two times a day (BID) | RESPIRATORY_TRACT | Status: DC

## 2014-12-01 MED ORDER — AZITHROMYCIN 250 MG PO TABS: ORAL_TABLET | ORAL | Status: DC

## 2014-12-01 MED ORDER — METHYLPREDNISOLONE ACETATE 40 MG/ML IJ SUSP
40.0000 mg | Freq: Once | INTRAMUSCULAR | Status: AC
  Administered 2014-12-01: 40 mg via INTRAMUSCULAR

## 2014-12-01 MED ORDER — ALBUTEROL SULFATE HFA 108 (90 BASE) MCG/ACT IN AERS: 1.0000 | INHALATION_SPRAY | Freq: Four times a day (QID) | RESPIRATORY_TRACT | Status: DC | PRN

## 2014-12-01 MED ORDER — IBUPROFEN 800 MG PO TABS: 800.0000 mg | ORAL_TABLET | Freq: Three times a day (TID) | ORAL | Status: DC | PRN

## 2014-12-01 NOTE — Progress Notes (Signed)
Patient presents to clinic today c/o sinus pressure, sinus pain, ear pain and joint aches over the past 3 days.  Symptoms had gradual onset.  Patient also now having some loose stools and nausea with some emetic episodes today.  Emesis is non-bloody.  Denies fever or chills. Has significant history of sinusitis. Patient also with chronic bronchitis.  Continue to smoke.  Has increased use of her albuterol inhaler.   Past Medical History  Diagnosis Date  . Discoid lupus     Dr Ronnald Ramp, dermatologist  . Anxiety   . Asthma   . GERD (gastroesophageal reflux disease)   . Chronic kidney disease 1985    BLOCKED TUBE IN KIDNEY  . Seizures     EPILEPSY- NO SEIZURES SINCE AGE 89  . Headache(784.0)     MIGRAINES  . Insomnia   . Tendonitis     Current Outpatient Prescriptions on File Prior to Visit  Medication Sig Dispense Refill  . LORazepam (ATIVAN) 0.5 MG tablet TAKE 1 TABLET BY MOUTH TWICE DAILY AS NEEDED FOR ANXIETY 30 tablet 0  . methocarbamol (ROBAXIN) 500 MG tablet Take 1 tablet (500 mg total) by mouth 2 (two) times daily. 20 tablet 0  . oxyCODONE-acetaminophen (PERCOCET/ROXICET) 5-325 MG per tablet Take 0.5-1 tablets by mouth every 4 (four) hours as needed for severe pain.     No current facility-administered medications on file prior to visit.    Allergies  Allergen Reactions  . Tramadol Shortness Of Breath  . Amoxicillin-Pot Clavulanate     REACTION: dizziness, fatigue/drowsiness and nausea  . Bactrim [Sulfamethoxazole-Trimethoprim]     Blister in mouth  . Ciprofloxacin Hcl     Blisters in mouth  . Doxycycline     Fatigue, sleepy, worse   . Keflex [Cephalexin]     Blister in mouth  . Wellbutrin [Bupropion]     Blisters in mouth    Family History  Problem Relation Age of Onset  . Coronary artery disease Mother   . Hypertension Mother   . Hypertension Father   . Colon cancer      grandfather    History   Social History  . Marital Status: Married    Spouse  Name: N/A    Number of Children: N/A  . Years of Education: N/A   Social History Main Topics  . Smoking status: Current Every Day Smoker -- 0.50 packs/day    Types: Cigarettes  . Smokeless tobacco: Never Used     Comment: Maybe later  . Alcohol Use: Yes     Comment: OCCASIONAL DRINK  . Drug Use: No  . Sexual Activity: Yes   Other Topics Concern  . None   Social History Narrative   Lives with daughter.   Review of Systems - See HPI.  All other ROS are negative.  BP 127/66 mmHg  Pulse 78  Temp(Src) 98.4 F (36.9 C) (Oral)  Resp 16  Ht 5' 1.5" (1.562 m)  Wt 199 lb (90.266 kg)  BMI 37.00 kg/m2  SpO2 96%  Physical Exam  Constitutional: She is oriented to person, place, and time and well-developed, well-nourished, and in no distress.  HENT:  Head: Normocephalic and atraumatic.  Right Ear: External ear normal.  Left Ear: External ear normal.  Nose: Nose normal.  Mouth/Throat: Oropharynx is clear and moist. No oropharyngeal exudate.  + TTP of sinuses noted on exam.  Eyes: Conjunctivae are normal. Pupils are equal, round, and reactive to light.  Neck: Neck supple.  Cardiovascular: Normal  rate, regular rhythm, normal heart sounds and intact distal pulses.   Pulmonary/Chest: Effort normal and breath sounds normal.  Lymphadenopathy:    She has no cervical adenopathy.  Neurological: She is alert and oriented to person, place, and time.  Skin: Skin is warm and dry. No rash noted.  Psychiatric: Affect normal.  Vitals reviewed.    Assessment/Plan: Acute bacterial sinusitis Rx Azithromycin. Increase fluids.  Rest.  Saline nasal spray.  Continue COPD medications.  Symbicort RX to use during current illness.  Humidifier in bedroom.  Return precautions discussed with patient.

## 2014-12-01 NOTE — Patient Instructions (Signed)
Please take antibiotic as directed.  Continue albuterol inhaler as directed.  Begin The Symbicort as directed during a flare-up of your symptoms.  Get plenty of fluids and plenty of rest. Mucinex for congestion.  Follow-up if symptoms are not improving.

## 2014-12-04 DIAGNOSIS — J019 Acute sinusitis, unspecified: Principal | ICD-10-CM

## 2014-12-04 DIAGNOSIS — B9689 Other specified bacterial agents as the cause of diseases classified elsewhere: Secondary | ICD-10-CM | POA: Insufficient documentation

## 2014-12-04 NOTE — Assessment & Plan Note (Signed)
Rx Azithromycin. Increase fluids.  Rest.  Saline nasal spray.  Continue COPD medications.  Symbicort RX to use during current illness.  Humidifier in bedroom.  Return precautions discussed with patient.

## 2014-12-21 ENCOUNTER — Telehealth: Payer: Self-pay | Admitting: Family Medicine

## 2014-12-21 DIAGNOSIS — B9689 Other specified bacterial agents as the cause of diseases classified elsewhere: Secondary | ICD-10-CM

## 2014-12-21 DIAGNOSIS — J019 Acute sinusitis, unspecified: Principal | ICD-10-CM

## 2014-12-21 MED ORDER — AZITHROMYCIN 250 MG PO TABS: ORAL_TABLET | ORAL | Status: DC

## 2014-12-21 NOTE — Telephone Encounter (Signed)
Z-pack sent to pharmacy. Increase fluids. Return to clinic to see PCP if symptoms are not improving.

## 2014-12-21 NOTE — Telephone Encounter (Signed)
Caller name:Sullivan, Donna Relation to pt: self  Call back number: [phone] Pharmacy: [facility] [phone]   Reason for call:   Pt states budesonide-formoterol (SYMBICORT) 160-4.5 MCG/ACT inhaler caused blister on tounge and throat. Pt symtopms are reocurring and requesting another rx. Pt states she does not want to come and pay $50 copay. Please advise

## 2014-12-21 NOTE — Telephone Encounter (Signed)
LMOM with contact name and number [for return call, if needed] RE: new Rx and further provider instructions; also advised about rinse, gargle & spit with steroid inhaler usage/SLS

## 2015-01-11 ENCOUNTER — Other Ambulatory Visit: Payer: Self-pay | Admitting: Family Medicine

## 2015-01-11 NOTE — Telephone Encounter (Signed)
Last OV 03-05-14 lorazepam last filled 10-28-14 #30 with 0  No upcoming appts

## 2015-01-11 NOTE — Telephone Encounter (Signed)
Med filled and faxed.  

## 2015-04-06 ENCOUNTER — Other Ambulatory Visit: Payer: Self-pay | Admitting: Family Medicine

## 2015-04-06 NOTE — Telephone Encounter (Signed)
Med filled and faxed.  

## 2015-04-06 NOTE — Telephone Encounter (Signed)
Last OV 03-05-14 Lorazepam last filled 01-11-15 #30 with 0  Moderate Risk, UDS given 10/27/14

## 2015-04-13 ENCOUNTER — Encounter (HOSPITAL_BASED_OUTPATIENT_CLINIC_OR_DEPARTMENT_OTHER): Payer: Self-pay

## 2015-04-13 ENCOUNTER — Other Ambulatory Visit: Payer: Self-pay

## 2015-04-13 ENCOUNTER — Emergency Department (HOSPITAL_BASED_OUTPATIENT_CLINIC_OR_DEPARTMENT_OTHER)
Admission: EM | Admit: 2015-04-13 | Discharge: 2015-04-13 | Disposition: A | Discharge status: Home / Self Care | Payer: BLUE CROSS/BLUE SHIELD | Attending: Emergency Medicine | Admitting: Emergency Medicine

## 2015-04-13 ENCOUNTER — Telehealth: Payer: Self-pay | Admitting: Family Medicine

## 2015-04-13 ENCOUNTER — Emergency Department (HOSPITAL_BASED_OUTPATIENT_CLINIC_OR_DEPARTMENT_OTHER): Payer: BLUE CROSS/BLUE SHIELD

## 2015-04-13 ENCOUNTER — Encounter (HOSPITAL_COMMUNITY): Payer: Self-pay | Admitting: Emergency Medicine

## 2015-04-13 ENCOUNTER — Emergency Department (HOSPITAL_COMMUNITY)
Admission: EM | Admit: 2015-04-13 | Discharge: 2015-04-13 | Discharge status: Against Medical Advice | Payer: BLUE CROSS/BLUE SHIELD | Attending: Emergency Medicine | Admitting: Emergency Medicine

## 2015-04-13 DIAGNOSIS — Z872 Personal history of diseases of the skin and subcutaneous tissue: Secondary | ICD-10-CM | POA: Insufficient documentation

## 2015-04-13 DIAGNOSIS — Z880 Allergy status to penicillin: Secondary | ICD-10-CM | POA: Insufficient documentation

## 2015-04-13 DIAGNOSIS — M25512 Pain in left shoulder: Secondary | ICD-10-CM | POA: Diagnosis not present

## 2015-04-13 DIAGNOSIS — R05 Cough: Secondary | ICD-10-CM | POA: Diagnosis not present

## 2015-04-13 DIAGNOSIS — Z8669 Personal history of other diseases of the nervous system and sense organs: Secondary | ICD-10-CM | POA: Insufficient documentation

## 2015-04-13 DIAGNOSIS — Z720 Tobacco use: Secondary | ICD-10-CM | POA: Diagnosis not present

## 2015-04-13 DIAGNOSIS — N189 Chronic kidney disease, unspecified: Secondary | ICD-10-CM | POA: Diagnosis not present

## 2015-04-13 DIAGNOSIS — R079 Chest pain, unspecified: Secondary | ICD-10-CM | POA: Insufficient documentation

## 2015-04-13 DIAGNOSIS — Z8719 Personal history of other diseases of the digestive system: Secondary | ICD-10-CM | POA: Insufficient documentation

## 2015-04-13 DIAGNOSIS — Z8739 Personal history of other diseases of the musculoskeletal system and connective tissue: Secondary | ICD-10-CM | POA: Diagnosis not present

## 2015-04-13 DIAGNOSIS — J45901 Unspecified asthma with (acute) exacerbation: Secondary | ICD-10-CM | POA: Insufficient documentation

## 2015-04-13 DIAGNOSIS — F419 Anxiety disorder, unspecified: Secondary | ICD-10-CM | POA: Diagnosis not present

## 2015-04-13 DIAGNOSIS — J40 Bronchitis, not specified as acute or chronic: Secondary | ICD-10-CM

## 2015-04-13 DIAGNOSIS — Z79899 Other long term (current) drug therapy: Secondary | ICD-10-CM | POA: Insufficient documentation

## 2015-04-13 LAB — CBC
HEMATOCRIT: 45.5 % (ref 36.0–46.0)
Hemoglobin: 15 g/dL (ref 12.0–15.0)
MCH: 30.7 pg (ref 26.0–34.0)
MCHC: 33 g/dL (ref 30.0–36.0)
MCV: 93.2 fL (ref 78.0–100.0)
Platelets: 162 10*3/uL (ref 150–400)
RBC: 4.88 MIL/uL (ref 3.87–5.11)
RDW: 12.9 % (ref 11.5–15.5)
WBC: 12.4 10*3/uL — ABNORMAL HIGH (ref 4.0–10.5)

## 2015-04-13 LAB — BASIC METABOLIC PANEL
Anion gap: 6 (ref 5–15)
BUN: 8 mg/dL (ref 6–23)
CALCIUM: 8.4 mg/dL (ref 8.4–10.5)
CO2: 23 mmol/L (ref 19–32)
Chloride: 107 mmol/L (ref 96–112)
Creatinine, Ser: 0.6 mg/dL (ref 0.50–1.10)
GFR calc non Af Amer: >90 mL/min (ref >90)
Glucose, Bld: 136 mg/dL — ABNORMAL HIGH (ref 70–99)
POTASSIUM: 3.6 mmol/L (ref 3.5–5.1)
SODIUM: 136 mmol/L (ref 135–145)

## 2015-04-13 LAB — TROPONIN I: Troponin I: <0.03 ng/mL (ref <0.031)

## 2015-04-13 LAB — D-DIMER, QUANTITATIVE: D-Dimer, Quant: <0.27 ug/mL-FEU (ref 0.00–0.48)

## 2015-04-13 MED ORDER — ALBUTEROL SULFATE (2.5 MG/3ML) 0.083% IN NEBU: 2.5000 mg | INHALATION_SOLUTION | Freq: Four times a day (QID) | RESPIRATORY_TRACT | Status: DC | PRN

## 2015-04-13 MED ORDER — ALBUTEROL SULFATE HFA 108 (90 BASE) MCG/ACT IN AERS
2.0000 | INHALATION_SPRAY | Freq: Once | RESPIRATORY_TRACT | Status: AC
  Administered 2015-04-13: 2 via RESPIRATORY_TRACT
  Filled 2015-04-13: qty 6.7

## 2015-04-13 MED ORDER — KETOROLAC TROMETHAMINE 30 MG/ML IJ SOLN
15.0000 mg | Freq: Once | INTRAMUSCULAR | Status: AC
  Administered 2015-04-13: 15 mg via INTRAVENOUS

## 2015-04-13 MED ORDER — KETOROLAC TROMETHAMINE 30 MG/ML IJ SOLN
INTRAMUSCULAR | Status: AC
  Filled 2015-04-13: qty 1

## 2015-04-13 MED ORDER — IPRATROPIUM-ALBUTEROL 0.5-2.5 (3) MG/3ML IN SOLN
3.0000 mL | RESPIRATORY_TRACT | Status: DC
  Administered 2015-04-13: 3 mL via RESPIRATORY_TRACT
  Filled 2015-04-13: qty 3

## 2015-04-13 MED ORDER — KETOROLAC TROMETHAMINE 30 MG/ML IJ SOLN
30.0000 mg | Freq: Once | INTRAMUSCULAR | Status: AC
  Administered 2015-04-13: 30 mg via INTRAVENOUS
  Filled 2015-04-13: qty 1

## 2015-04-13 NOTE — ED Notes (Signed)
Pt. reports left chest tightness radiating to left shoulder and upper arm with SOB and productive cough onset this week , denies emesis or diaphoresis .

## 2015-04-13 NOTE — Telephone Encounter (Signed)
Patient Name: Donna Sullivan DOB: [birth year] Initial Comment Caller states she is having chest pain. She had a EKG last night- Everything was fine. Nurse Assessment Nurse: Ronnald Ramp, RN, Miranda Date/Time (Eastern Time): 04/13/2015 11:11:07 AM Confirm and document reason for call. If symptomatic, describe symptoms. ---Caller states she has been having pain in her left breast that radiates into her back for the last 2-3 days. She went to the fire department last night and had a normal EKG. She has had a hard cough for 1 month. Albuterol twice daily. Has the patient traveled out of the country within the last 30 days? ---Not Applicable Does the patient require triage? ---Yes Related visit to physician within the last 2 weeks? ---No Does the PT have any chronic conditions? (i.e. diabetes, asthma, etc.) ---Yes List chronic conditions. ---Lupus, Fibromyalgia, Anxiety, Asthma Did the patient indicate they were pregnant? ---No Guidelines Guideline Title Affirmed Question Affirmed Notes Asthma Attack Chest pain Final Disposition User Go to ED Now Ronnald Ramp, RN, [employer]

## 2015-04-13 NOTE — ED Notes (Addendum)
C/o CP and cough x 5 days-prod cough this am-went to ED last night left after 3 hour wait-spoke with PCP over phone and advised to use inhaler and felt r/t to breathing and to come to ED again

## 2015-04-13 NOTE — Discharge Instructions (Signed)
Return to the emergency room with worsening of symptoms, new symptoms or with symptoms that are concerning , especially fevers, stiff neck, worsening headache, nausea/vomiting, visual changes or slurred speech, chest pain, shortness of breath, cough with thick colored mucous or blood Drink plenty of fluids with electrolytes especially Gatorade. OTC cold medications such as mucinex, nyquil, dayquil are recommended. Chloraseptic for sore throat. Ibuprofen 400mg  (2 tablets 200mg ) every 5-6 hours for 3-5 days. Inhaler as needed and nebulizer. Call to make appointment as soon as possible with your primary doctor. Read below information and follow recommendations.  Chest Pain (Nonspecific) It is often hard to give a specific diagnosis for the cause of chest pain. There is always a chance that your pain could be related to something serious, such as a heart attack or a blood clot in the lungs. You need to follow up with your health care provider for further evaluation. CAUSES   Heartburn.  Pneumonia or bronchitis.  Anxiety or stress.  Inflammation around your heart (pericarditis) or lung (pleuritis or pleurisy).  A blood clot in the lung.  A collapsed lung (pneumothorax). It can develop suddenly on its own (spontaneous pneumothorax) or from trauma to the chest.  Shingles infection (herpes zoster virus). The chest wall is composed of bones, muscles, and cartilage. Any of these can be the source of the pain.  The bones can be bruised by injury.  The muscles or cartilage can be strained by coughing or overwork.  The cartilage can be affected by inflammation and become sore (costochondritis). DIAGNOSIS  Lab tests or other studies may be needed to find the cause of your pain. Your health care provider may have you take a test called an ambulatory electrocardiogram (ECG). An ECG records your heartbeat patterns over a 24-hour period. You may also have other tests, such as:  Transthoracic  echocardiogram (TTE). During echocardiography, sound waves are used to evaluate how blood flows through your heart.  Transesophageal echocardiogram (TEE).  Cardiac monitoring. This allows your health care provider to monitor your heart rate and rhythm in real time.  Holter monitor. This is a portable device that records your heartbeat and can help diagnose heart arrhythmias. It allows your health care provider to track your heart activity for several days, if needed.  Stress tests by exercise or by giving medicine that makes the heart beat faster. TREATMENT   Treatment depends on what may be causing your chest pain. Treatment may include:  Acid blockers for heartburn.  Anti-inflammatory medicine.  Pain medicine for inflammatory conditions.  Antibiotics if an infection is present.  You may be advised to change lifestyle habits. This includes stopping smoking and avoiding alcohol, caffeine, and chocolate.  You may be advised to keep your head raised (elevated) when sleeping. This reduces the chance of acid going backward from your stomach into your esophagus. Most of the time, nonspecific chest pain will improve within 2-3 days with rest and mild pain medicine.  HOME CARE INSTRUCTIONS   If antibiotics were prescribed, take them as directed. Finish them even if you start to feel better.  For the next few days, avoid physical activities that bring on chest pain. Continue physical activities as directed.  Do not use any tobacco products, including cigarettes, chewing tobacco, or electronic cigarettes.  Avoid drinking alcohol.  Only take medicine as directed by your health care provider.  Follow your health care provider's suggestions for further testing if your chest pain does not go away.  Keep any follow-up appointments  you made. If you do not go to an appointment, you could develop lasting (chronic) problems with pain. If there is any problem keeping an appointment, call to  reschedule. SEEK MEDICAL CARE IF:   Your chest pain does not go away, even after treatment.  You have a rash with blisters on your chest.  You have a fever. SEEK IMMEDIATE MEDICAL CARE IF:   You have increased chest pain or pain that spreads to your arm, neck, jaw, back, or abdomen.  You have shortness of breath.  You have an increasing cough, or you cough up blood.  You have severe back or abdominal pain.  You feel nauseous or vomit.  You have severe weakness.  You faint.  You have chills. This is an emergency. Do not wait to see if the pain will go away. Get medical help at once. Call your local emergency services (911 in [place] Do not drive yourself to the hospital. MAKE SURE YOU:   Understand these instructions.  Will watch your condition.  Will get help right away if you are not doing well or get worse. Document Released: 09/26/2005 Document Revised: 12/22/2013 Document Reviewed: 07/22/2008 [facility] Patient Information 2015 [place], [place]. This information is not intended to replace advice given to you by your health care provider. Make sure you discuss any questions you have with your health care provider.  Acute Bronchitis Bronchitis is inflammation of the airways that extend from the windpipe into the lungs (bronchi). The inflammation often causes mucus to develop. This leads to a cough, which is the most common symptom of bronchitis.  In acute bronchitis, the condition usually develops suddenly and goes away over time, usually in a couple weeks. Smoking, allergies, and asthma can make bronchitis worse. Repeated episodes of bronchitis may cause further lung problems.  CAUSES Acute bronchitis is most often caused by the same virus that causes a cold. The virus can spread from person to person (contagious) through coughing, sneezing, and touching contaminated objects. SIGNS AND SYMPTOMS   Cough.   Fever.   Coughing up mucus.   Body aches.   Chest  congestion.   Chills.   Shortness of breath.   Sore throat.  DIAGNOSIS  Acute bronchitis is usually diagnosed through a physical exam. Your health care provider will also ask you questions about your medical history. Tests, such as chest X-rays, are sometimes done to rule out other conditions.  TREATMENT  Acute bronchitis usually goes away in a couple weeks. Oftentimes, no medical treatment is necessary. Medicines are sometimes given for relief of fever or cough. Antibiotic medicines are usually not needed but may be prescribed in certain situations. In some cases, an inhaler may be recommended to help reduce shortness of breath and control the cough. A cool mist vaporizer may also be used to help thin bronchial secretions and make it easier to clear the chest.  HOME CARE INSTRUCTIONS  Get plenty of rest.   Drink enough fluids to keep your urine clear or pale yellow (unless you have a medical condition that requires fluid restriction). Increasing fluids may help thin your respiratory secretions (sputum) and reduce chest congestion, and it will prevent dehydration.   Take medicines only as directed by your health care provider.  If you were prescribed an antibiotic medicine, finish it all even if you start to feel better.  Avoid smoking and secondhand smoke. Exposure to cigarette smoke or irritating chemicals will make bronchitis worse. If you are a smoker, consider using nicotine gum  or skin patches to help control withdrawal symptoms. Quitting smoking will help your lungs heal faster.   Reduce the chances of another bout of acute bronchitis by washing your hands frequently, avoiding people with cold symptoms, and trying not to touch your hands to your mouth, nose, or eyes.   Keep all follow-up visits as directed by your health care provider.  SEEK MEDICAL CARE IF: Your symptoms do not improve after 1 week of treatment.  SEEK IMMEDIATE MEDICAL CARE IF:  You develop an increased  fever or chills.   You have chest pain.   You have severe shortness of breath.  You have bloody sputum.   You develop dehydration.  You faint or repeatedly feel like you are going to pass out.  You develop repeated vomiting.  You develop a severe headache. MAKE SURE YOU:   Understand these instructions.  Will watch your condition.  Will get help right away if you are not doing well or get worse. Document Released: 01/24/2005 Document Revised: 05/03/2014 Document Reviewed: 06/09/2013 [facility] Patient Information 2015 [place], [place]. This information is not intended to replace advice given to you by your health care provider. Make sure you discuss any questions you have with your health care provider.

## 2015-04-13 NOTE — ED Notes (Signed)
Patient asked to change into a gown from waist up.

## 2015-04-13 NOTE — ED Provider Notes (Signed)
CSN: [id number]     Arrival date & time 04/13/15  1211 History   First MD Initiated Contact with Patient 04/13/15 1240     Chief Complaint  Patient presents with  . Chest Pain     (Consider location/radiation/quality/duration/timing/severity/associated sxs/prior Treatment) HPI  Donna Sullivan is a 49 y.o. female with PMH of lupus, anxiety, asthma, CK D, seizures presenting with sharp chest pain that started 5 days ago. Pain worse with cough. No improvement with ibuprofen and inhalers. Patient states cough is productive of thick brownish mucous this morning. This has cleared. Patient also endorses shortness of breath. Pain is left-sided. She denies any diaphoresis or emesis. Patient denies history of hypertension, diabetes, dyslipidemia. Family history of cardiac disease patient is a smoker and obese. Pt denies history of DVT, PE, recent surgery or trauma, hemoptysis, unilateral leg swelling or tenderness, immobilization.    Past Medical History  Diagnosis Date  . Discoid lupus     Dr Ronnald Ramp, dermatologist  . Anxiety   . Asthma   . GERD (gastroesophageal reflux disease)   . Chronic kidney disease 1985    BLOCKED TUBE IN KIDNEY  . Seizures     EPILEPSY- NO SEIZURES SINCE AGE 59  . Headache(784.0)     MIGRAINES  . Insomnia   . Tendonitis    Past Surgical History  Procedure Laterality Date  . Appendectomy    . Kidney surgery      right, straightened tube  . Right oophorectomy    . Ankle reconstruction  04/2009  . Other surgical history      Hysterectomy with 10cm grown removal, Dr. Willis Modena  . Abdominal hysterectomy    . Cesarean section    . Cholecystectomy    . Diagnostic laparoscopy    . Laparoscopy  09/23/2012    Procedure: LAPAROSCOPY OPERATIVE;  Surgeon: Cheri Fowler, MD;  Location: [facility];  Service: Gynecology;  Laterality: N/A;   Family History  Problem Relation Age of Onset  . Coronary artery disease Mother   . Hypertension Mother   . Hypertension Father   .  Colon cancer      grandfather   History  Substance Use Topics  . Smoking status: Current Every Day Smoker -- 0.50 packs/day    Types: Cigarettes  . Smokeless tobacco: Never Used     Comment: Maybe later  . Alcohol Use: Yes     Comment: occ   OB History    Gravida Para Term Preterm AB TAB SAB Ectopic Multiple Living   3 1   2     1      Review of Systems 10 Systems reviewed and are negative for acute change except as noted in the HPI.    Allergies  Tramadol; Amoxicillin-pot clavulanate; Bactrim; Ciprofloxacin hcl; Doxycycline; Keflex; and Wellbutrin  Home Medications   Prior to Admission medications   Medication Sig Start Date End Date Taking? Authorizing Provider  albuterol (PROVENTIL HFA;VENTOLIN HFA) 108 (90 BASE) MCG/ACT inhaler Inhale 1-2 puffs into the lungs every 6 (six) hours as needed for wheezing or shortness of breath. 12/01/14   Brunetta Jeans, PA-C  albuterol (PROVENTIL) (2.5 MG/3ML) 0.083% nebulizer solution Take 3 mLs (2.5 mg total) by nebulization every 6 (six) hours as needed for wheezing or shortness of breath. 04/13/15   Al Corpus, PA-C  ibuprofen (ADVIL,MOTRIN) 800 MG tablet Take 1 tablet (800 mg total) by mouth every 8 (eight) hours as needed for mild pain. 12/01/14   Brunetta Jeans, PA-C  LORazepam (ATIVAN) 0.5 MG tablet TAKE 1 TABLET BY MOUTH TWICE DAILY AS NEEDED FOR ANXIETY 04/06/15   Midge Minium, MD   BP 112/58 mmHg  Pulse 68  Temp(Src) 98.1 F (36.7 C) (Oral)  Resp 16  Ht 5\' 2"  (1.575 m)  Wt 198 lb (89.812 kg)  BMI 36.21 kg/m2  SpO2 100% Physical Exam  Constitutional: She appears well-developed and well-nourished. No distress.  HENT:  Head: Normocephalic and atraumatic.  Eyes: Conjunctivae and EOM are normal. Right eye exhibits no discharge. Left eye exhibits no discharge.  Neck: No JVD present.  Cardiovascular: Normal rate and regular rhythm.   No leg swelling or tenderness. Negative Homan's sign.  Pulmonary/Chest: Effort normal  and breath sounds normal. No respiratory distress.  Mild wheezing throughout with decreased air movement. No respiratory distress. Chest pain reproducible to palpation to sternum and left upper back  Abdominal: Soft. Bowel sounds are normal. She exhibits no distension. There is no tenderness.  Neurological: She is alert. She exhibits normal muscle tone. Coordination normal.  Skin: Skin is warm and dry. She is not diaphoretic.  Nursing note and vitals reviewed.   ED Course  Procedures (including critical care time) Labs Review Labs Reviewed  CBC - Abnormal; Notable for the following:    WBC 12.4 (*)    All other components within normal limits  BASIC METABOLIC PANEL - Abnormal; Notable for the following:    Glucose, Bld 136 (*)    All other components within normal limits  TROPONIN I  D-DIMER, QUANTITATIVE  TROPONIN I    Imaging Review Dg Chest 2 View  04/13/2015   CLINICAL DATA:  LEFT chest pain, pain radiating through to back, shortness of breath, symptoms since 04/09/2015, history smoking, lupus, GERD  EXAM: CHEST  2 VIEW  COMPARISON:  09/11/2011  FINDINGS: Normal heart size, mediastinal contours, and pulmonary vascularity.  Mild peribronchial thickening.  No pulmonary infiltrate, pleural effusion, or pneumothorax.  Cervical ribs.  Bones otherwise unremarkable.  IMPRESSION: Bronchitic changes without infiltrate.   Electronically Signed   By: Lavonia Dana M.D.   On: 04/13/2015 12:45     EKG Interpretation   Date/Time:  Wednesday April 13 2015 12:15:34 EDT Ventricular Rate:  79 PR Interval:  182 QRS Duration: 76 QT Interval:  380 QTC Calculation: 435 R Axis:   67 Text Interpretation:  Normal sinus rhythm Normal ECG No significant change  was found Confirmed by Wyvonnia Dusky  MD, STEPHEN [id number]) on 04/13/2015 12:55:53  PM      MDM   Final diagnoses:  Bronchitis  Chest pain, unspecified chest pain type   Patient presenting with chest pain worse with cough with history of  asthma. Cardiac risk factors include family history, smoking, obesity. Heart score 3. Low risk [place] with report of brown sputum and negative d-dimer. VSS. No hypoxia or tachycardia. EKG without evidence of ischemia. Negative troponin. Patient with improvement with Toradol and nebulizer in ED. Chest x-ray with evidence of bronchitis. No infiltrate. Suspects this is related to her asthma/ bronchitis. I doubt ACS, aortic dissection, PE. Pt given albuterol inhaler in ED and albuterol nebulizer treatment. Patient is afebrile, nontoxic, and in no acute distress. Patient is appropriate for outpatient management and is stable for discharge. Follow up with PCP.  Discussed return precautions with patient. Discussed all results and patient verbalizes understanding and agrees with plan.  This is a shared patient. This patient was discussed with the physician who saw and evaluated the patient and agrees with the  plan.   Al Corpus, PA-C 04/13/15 [place], MD 04/14/15 [phone]

## 2015-04-13 NOTE — Telephone Encounter (Signed)
Patient in [facility] ED.

## 2015-04-14 ENCOUNTER — Telehealth: Payer: Self-pay | Admitting: Family Medicine

## 2015-04-14 MED ORDER — KETOROLAC TROMETHAMINE 10 MG PO TABS: 10.0000 mg | ORAL_TABLET | Freq: Four times a day (QID) | ORAL | Status: DC | PRN

## 2015-04-14 NOTE — Telephone Encounter (Signed)
Please advise, pt was seen in the ED for Bronchitis and chest pain

## 2015-04-14 NOTE — Telephone Encounter (Signed)
Cannot give script for Toradol injxn.  If injxn is desired, she would need nurse visit.

## 2015-04-14 NOTE — Telephone Encounter (Signed)
Called patient at [phone] [facility]) *Preferred* and left message for patient to return call.

## 2015-04-14 NOTE — Telephone Encounter (Signed)
Relation to pt: self  Call back number: [phone] Pharmacy: [place], [place]. [phone] (Phone) [phone] (Fax)         Reason for call:  Pt requesting a rx for ketorolac (TORADOL) 30 MG/ML injection 30 mg . Pt was seen in the ED last night and stated the injection helped. Please advise

## 2015-04-14 NOTE — Telephone Encounter (Signed)
Medication filled, pt notified. Pt states that if medication is to expensive she will not pick up and will schedule a nurse visit to receive the injection (Toradol 60mg ) instead. This is ok per provider as long as it is verified with pharmacy that she did not pick up medication. Nurse visit can be scheduled for either today or tomorrow.

## 2015-04-14 NOTE — Telephone Encounter (Signed)
Spoke with pt who advised she would prefer an oral tablet for the toradol instead of injection. Pt also wanted to know how much medication would cost. Advised pt that we do not know all we can do is prescribe medication. Pt stated an understanding. Pt was also advised that she could only be on Toradol three days due to effects on the Kidneys. Advised that yesterday counted as day one.   Please advise on dose and SIG

## 2015-04-14 NOTE — Telephone Encounter (Signed)
Ok for Toradol 10mg  tab, 1 tab Q6 prn, #8 no refills

## 2015-04-18 ENCOUNTER — Telehealth: Payer: Self-pay | Admitting: Family Medicine

## 2015-04-18 MED ORDER — BENZONATATE 200 MG PO CAPS: 200.0000 mg | ORAL_CAPSULE | Freq: Three times a day (TID) | ORAL | Status: DC | PRN

## 2015-04-18 NOTE — Telephone Encounter (Signed)
[place] for [employer] 200mg  TID, #60, no refills

## 2015-04-18 NOTE — Telephone Encounter (Signed)
Relation to pt: self  Call back number: [phone] Pharmacy: [facility], [place]. [phone] (Phone) [phone] (Fax)         Reason for call:  Pt requesting  tessalon cough medication pt was seen in the ED 04/13/15

## 2015-04-18 NOTE — Telephone Encounter (Signed)
Med filled, will notify pt.  

## 2015-04-18 NOTE — Telephone Encounter (Signed)
Please advise if ok to send. Pt has not been seen since 11/2014

## 2015-04-19 ENCOUNTER — Telehealth: Payer: Self-pay | Admitting: Family Medicine

## 2015-04-19 NOTE — Telephone Encounter (Signed)
Can you please call and triage pt?

## 2015-04-19 NOTE — Telephone Encounter (Signed)
SHE FEELS AWFUL  PLEASE CALL HER AND LET HER KNOW WHAT TO DO

## 2015-04-20 NOTE — Telephone Encounter (Signed)
LM for call back

## 2015-04-20 NOTE — Telephone Encounter (Signed)
Pt was offered an appointment with PA today denied due to her babysitting her grandchild. Pt was offered an appointment with MD 04/21/15 at 10:45am denied due to her working 3rd shift, offered pt 2pm accepted. Pt states this is a ED follow up for bronchitis.

## 2015-04-20 NOTE — Telephone Encounter (Signed)
Noted  

## 2015-04-21 ENCOUNTER — Telehealth: Payer: Self-pay | Admitting: General Practice

## 2015-04-21 ENCOUNTER — Other Ambulatory Visit: Payer: Self-pay | Admitting: General Practice

## 2015-04-21 ENCOUNTER — Encounter: Payer: Self-pay | Admitting: Family Medicine

## 2015-04-21 ENCOUNTER — Ambulatory Visit (INDEPENDENT_AMBULATORY_CARE_PROVIDER_SITE_OTHER): Payer: BLUE CROSS/BLUE SHIELD | Admitting: Family Medicine

## 2015-04-21 VITALS — BP 122/82 | HR 78 | Temp 97.9°F | Resp 18 | Wt 206.1 lb

## 2015-04-21 DIAGNOSIS — J209 Acute bronchitis, unspecified: Secondary | ICD-10-CM

## 2015-04-21 DIAGNOSIS — J40 Bronchitis, not specified as acute or chronic: Secondary | ICD-10-CM

## 2015-04-21 MED ORDER — PROMETHAZINE-DM 6.25-15 MG/5ML PO SYRP: 5.0000 mL | ORAL_SOLUTION | Freq: Four times a day (QID) | ORAL | Status: DC | PRN

## 2015-04-21 MED ORDER — BECLOMETHASONE DIPROPIONATE 80 MCG/ACT IN AERS: 1.0000 | INHALATION_SPRAY | Freq: Two times a day (BID) | RESPIRATORY_TRACT | Status: DC

## 2015-04-21 MED ORDER — GUAIFENESIN-CODEINE 100-10 MG/5ML PO SOLN: 5.0000 mL | Freq: Four times a day (QID) | ORAL | Status: DC | PRN

## 2015-04-21 MED ORDER — METHYLPREDNISOLONE ACETATE 80 MG/ML IJ SUSP
80.0000 mg | Freq: Once | INTRAMUSCULAR | Status: AC
  Administered 2015-04-21: 80 mg via INTRAMUSCULAR

## 2015-04-21 MED ORDER — IPRATROPIUM-ALBUTEROL 0.5-2.5 (3) MG/3ML IN SOLN
3.0000 mL | Freq: Once | RESPIRATORY_TRACT | Status: AC
  Administered 2015-04-21: 3 mL via RESPIRATORY_TRACT

## 2015-04-21 MED ORDER — AZITHROMYCIN 250 MG PO TABS: ORAL_TABLET | ORAL | Status: DC

## 2015-04-21 MED ORDER — PREDNISONE 10 MG PO TABS: ORAL_TABLET | ORAL | Status: DC

## 2015-04-21 NOTE — Progress Notes (Signed)
Pre visit review using our clinic review tool, if applicable. No additional management support is needed unless otherwise documented below in the visit note. 

## 2015-04-21 NOTE — Assessment & Plan Note (Signed)
Recurrent problem for pt.  Sxs improved s/p Duoneb.  Start Zpack.  Qvar for daily use.  Depo-medrol given in the office- pt to start pred taper tomorrow.  Continue albuterol as needed.  Pt asked for pain medications prior to leaving office- she was told no, that prednisone should improve her pain.  Pt was unhappy w/ this.  Pt then called back to office while at pharmacy indication she was not able to take Promethazine cough syrup due to dextromethorphan allergy (not documented here or at pharmacy).  Offered pt Robitussin AC.  Pt called back asking for Tussionex by name.  Pt is now clearly demonstrating drug seeking behavior.  Will not prescribe any additional controlled substances to this pt.

## 2015-04-21 NOTE — Patient Instructions (Signed)
Follow up as needed Start the Zpack as directed Take the Qvar- 1 puff twice daily- until feeling better Use the albuterol as needed for wheezing/shortness of breath Start Prednisone tomorrow AM- take w/ food Use the cough syrup as needed, will cause drowsiness REST! Drink plenty of fluids Call with any questions or concerns Hang in there!

## 2015-04-21 NOTE — Telephone Encounter (Signed)
Pt called the office to state that she could not take the Promethazine cough medication due to a reaction with the dextromethorphan. Spoke with provider who gave the OK to send in Robitussin [facility].

## 2015-04-21 NOTE — Telephone Encounter (Signed)
Agree as documented.  Pt has hx of high risk UDS w/ presence of opioids that were not prescribed for her.  Asked for pain meds today in office and was told no.  Pt now demonstrating drug seeking behavior based on multiple calls.  Will not prescribe controlled substances to pt as previously documented.

## 2015-04-21 NOTE — Progress Notes (Signed)
   Subjective:    Patient ID: Donna Sullivan, female    DOB: [birth year], 49 y.o.   MRN: [id number]  HPI ER f/u- pt was seen on 4/13 for bronchitis.  Was tx'd in ER w/ albuterol neb and Toradol.  Pt had normal EKG and Ddimer in ER.  Pt has hx of asthma.  Complains of increased wheezing, SOB, cough.  No fevers.  Reports sxs worsened since being seen.  No relief w/ Tessalon.  Unable to sleep.  No fevers.  'my chest is hurting so bad' from cough.  + laryngitis.   Review of Systems For ROS see HPI     Objective:   Physical Exam  Constitutional: She appears well-developed and well-nourished. No distress.  HENT:  Head: Normocephalic and atraumatic.  TMs normal bilaterally Mild nasal congestion Throat w/out erythema, edema, or exudate  Eyes: Conjunctivae and EOM are normal. Pupils are equal, round, and reactive to light.  Neck: Normal range of motion. Neck supple.  Cardiovascular: Normal rate, regular rhythm, normal heart sounds and intact distal pulses.   No murmur heard. Pulmonary/Chest: Effort normal. No respiratory distress. She has wheezes (diffusely, audible w/o stethoscope- improved s/p neb tx).  + hacking cough Poor air movement diffusely- improved s/p neb tx  Lymphadenopathy:    She has no cervical adenopathy.  Vitals reviewed.         Assessment & Plan:

## 2015-04-21 NOTE — Telephone Encounter (Signed)
Pt called back again upset. Stated that the pharmacy just took the robitussin [facility] off the shelf and filled the prescription. I advised pt that the Rx sent in had codeine in it and that was doubtful but would have to be taken up with the pharmacy. Pt asked why we did not just send in the "Tussionex, that way she could just knock herself out". Pt advised that it was not sent because it is a controlled substance and would have an original rx written. Also advised pt that the medication was not appropriate for her symptoms.

## 2015-04-21 NOTE — Telephone Encounter (Signed)
Received fax confirmation  That robitussin was received through fax.

## 2015-04-21 NOTE — Telephone Encounter (Signed)
Agree as documented

## 2015-04-21 NOTE — Telephone Encounter (Signed)
Pt called back and advised that she could not take the promethazine cough syrup, advised front desk to notify pt that a new Rx was sent in. I called [facility]. They advised that they did not have dextromethorphan listed as an allergy for pt. I advised pharmacist that pt had a new rx sent to their pharmacy and they advised they would fill medication. Pt notified.

## 2015-04-25 ENCOUNTER — Ambulatory Visit: Payer: BLUE CROSS/BLUE SHIELD | Admitting: Family Medicine

## 2015-04-25 ENCOUNTER — Other Ambulatory Visit: Payer: Self-pay

## 2015-04-28 ENCOUNTER — Other Ambulatory Visit: Payer: Self-pay

## 2015-05-04 ENCOUNTER — Telehealth: Payer: Self-pay | Admitting: General Practice

## 2015-05-04 NOTE — Telephone Encounter (Signed)
Last OV 04/21/15 (bronchitis) Ibuprofen last filled 12/01/14 #60 with 0

## 2015-05-05 MED ORDER — IBUPROFEN 800 MG PO TABS: 800.0000 mg | ORAL_TABLET | Freq: Three times a day (TID) | ORAL | Status: DC | PRN

## 2015-05-05 NOTE — Telephone Encounter (Signed)
Ok for #60 

## 2015-05-05 NOTE — Telephone Encounter (Signed)
Med filled.  

## 2015-05-12 ENCOUNTER — Telehealth: Payer: Self-pay | Admitting: Family Medicine

## 2015-05-12 ENCOUNTER — Other Ambulatory Visit: Payer: Self-pay | Admitting: Family Medicine

## 2015-05-12 MED ORDER — IBUPROFEN 800 MG PO TABS: 800.0000 mg | ORAL_TABLET | Freq: Three times a day (TID) | ORAL | Status: DC | PRN

## 2015-05-12 NOTE — Telephone Encounter (Signed)
Relation to pt: self  Call back number: [phone] Pharmacy: [facility], [place]. [phone] (Phone) [phone] (Fax)         Reason for call:  As per pt pharmacy has not received ibuprofen (ADVIL,MOTRIN) 800 MG tablet. Pt states she is still coughing, no energy. Please advise

## 2015-05-12 NOTE — Telephone Encounter (Signed)
Med filled again.

## 2015-05-13 NOTE — Telephone Encounter (Signed)
Last OV 04/21/15 (bronchitis) Lorazepam last filled 04-06-15 #30 with 0

## 2015-05-13 NOTE — Telephone Encounter (Signed)
[place] for #30, needs UDS

## 2015-05-13 NOTE — Telephone Encounter (Signed)
Med filled and faxed.  

## 2015-06-20 ENCOUNTER — Telehealth: Payer: Self-pay | Admitting: General Practice

## 2015-06-20 MED ORDER — LORAZEPAM 0.5 MG PO TABS: 0.5000 mg | ORAL_TABLET | Freq: Two times a day (BID) | ORAL | Status: DC | PRN

## 2015-06-20 NOTE — Telephone Encounter (Signed)
Last OV 04-21-15 Lorazepam last filled 05-13-15 #30 with 0  [facility], moderate risk UTD

## 2015-06-21 NOTE — Telephone Encounter (Signed)
Med filled and faxed.  

## 2015-08-02 ENCOUNTER — Other Ambulatory Visit: Payer: Self-pay | Admitting: Medical

## 2015-08-03 NOTE — Telephone Encounter (Signed)
Medication filled to pharmacy as requested.   

## 2015-08-03 NOTE — Telephone Encounter (Signed)
Pt was seen April for acute visit, Last CPE was in 2014.  Lorazepam last filled 06/20/15 #30 with 0

## 2015-08-12 ENCOUNTER — Encounter: Payer: Self-pay | Admitting: Internal Medicine

## 2015-08-12 ENCOUNTER — Other Ambulatory Visit: Payer: Self-pay

## 2015-08-12 ENCOUNTER — Ambulatory Visit (INDEPENDENT_AMBULATORY_CARE_PROVIDER_SITE_OTHER): Payer: BLUE CROSS/BLUE SHIELD | Admitting: Internal Medicine

## 2015-08-12 VITALS — BP 126/76 | HR 75 | Temp 98.4°F | Ht 61.5 in | Wt 205.2 lb

## 2015-08-12 DIAGNOSIS — L0292 Furuncle, unspecified: Secondary | ICD-10-CM | POA: Diagnosis not present

## 2015-08-12 MED ORDER — CLINDAMYCIN HCL 300 MG PO CAPS: 300.0000 mg | ORAL_CAPSULE | Freq: Four times a day (QID) | ORAL | Status: DC

## 2015-08-12 MED ORDER — LIDOCAINE HCL (PF) 2 % IJ SOLN: 2.0000 mL | Freq: Once | INTRAMUSCULAR | Status: DC

## 2015-08-12 MED ORDER — HYDROCODONE-ACETAMINOPHEN 5-325 MG PO TABS: 1.0000 | ORAL_TABLET | Freq: Three times a day (TID) | ORAL | Status: DC | PRN

## 2015-08-12 MED ORDER — CLINDAMYCIN HCL 300 MG PO CAPS
300.0000 mg | ORAL_CAPSULE | Freq: Four times a day (QID) | ORAL | Status: DC
Start: 2015-08-12 — End: 2015-08-12

## 2015-08-12 NOTE — Progress Notes (Signed)
Pre visit review using our clinic review tool, if applicable. No additional management support is needed unless otherwise documented below in the visit note. 

## 2015-08-12 NOTE — Patient Instructions (Addendum)
Keep the area clean and dry  Warm compress  Take the antibiotics as prescribed  ER if   fever , chills, swelling, more redness  Return in 1 week

## 2015-08-12 NOTE — Progress Notes (Signed)
Subjective:    Patient ID: Donna Sullivan, female    DOB: Nov 13, 1966, 49 y.o.   MRN: [id number]  DOS:  08/12/2015 Type of visit - description : Acute Interval history: Developed a red, swollen and tender area and the left suprapubic area 4 days ago. Getting slightly bigger. Suspect she has an abscess.  Review of Systems Has not seen any discharge. No fever chills No vaginal irritation or discharge.   Past Medical History  Diagnosis Date  . Discoid lupus     Dr Ronnald Ramp, dermatologist  . Anxiety   . Asthma   . GERD (gastroesophageal reflux disease)   . Chronic kidney disease 1985    BLOCKED TUBE IN KIDNEY  . Seizures     EPILEPSY- NO SEIZURES SINCE AGE 37  . Headache(784.0)     MIGRAINES  . Insomnia   . Tendonitis     Past Surgical History  Procedure Laterality Date  . Appendectomy    . Kidney surgery      right, straightened tube  . Right oophorectomy    . Ankle reconstruction  04/2009  . Other surgical history      Hysterectomy with 10cm grown removal, Dr. Willis Modena  . Abdominal hysterectomy    . Cesarean section    . Cholecystectomy    . Diagnostic laparoscopy    . Laparoscopy  09/23/2012    Procedure: LAPAROSCOPY OPERATIVE;  Surgeon: Cheri Fowler, MD;  Location: [facility];  Service: Gynecology;  Laterality: N/A;    Social History   Social History  . Marital Status: Married    Spouse Name: N/A  . Number of Children: N/A  . Years of Education: N/A   Occupational History  . Not on file.   Social History Main Topics  . Smoking status: Current Every Day Smoker -- 0.50 packs/day    Types: Cigarettes  . Smokeless tobacco: Never Used     Comment: Maybe later  . Alcohol Use: Yes     Comment: occ  . Drug Use: No  . Sexual Activity: Not on file   Other Topics Concern  . Not on file   Social History Narrative   Lives with daughter.        Medication List       This list is accurate as of: 08/12/15 11:59 PM.  Always use your most recent med list.               albuterol 108 (90 BASE) MCG/ACT inhaler  Commonly known as:  PROVENTIL HFA;VENTOLIN HFA  Inhale 1-2 puffs into the lungs every 6 (six) hours as needed for wheezing or shortness of breath.     albuterol (2.5 MG/3ML) 0.083% nebulizer solution  Commonly known as:  PROVENTIL  Take 3 mLs (2.5 mg total) by nebulization every 6 (six) hours as needed for wheezing or shortness of breath.     beclomethasone 80 MCG/ACT inhaler  Commonly known as:  QVAR  Inhale 1 puff into the lungs 2 (two) times daily.     clindamycin 300 MG capsule  Commonly known as:  CLEOCIN  Take 1 capsule (300 mg total) by mouth 4 (four) times daily.     HYDROcodone-acetaminophen 5-325 MG per tablet  Commonly known as:  NORCO/VICODIN  Take 1-2 tablets by mouth every 8 (eight) hours as needed.     ibuprofen 800 MG tablet  Commonly known as:  ADVIL,MOTRIN  Take 1 tablet (800 mg total) by mouth every 8 (eight) hours as needed for mild  pain.     LORazepam 0.5 MG tablet  Commonly known as:  ATIVAN  TAKE 1 TABLET BY MOUTH TWICE DAILY AS NEEDED FOR ANXIETY           Objective:   Physical Exam  Genitourinary:      BP 126/76 mmHg  Pulse 75  Temp(Src) 98.4 F (36.9 C) (Oral)  Ht 5' 1.5" (1.562 m)  Wt 205 lb 4 oz (93.101 kg)  BMI 38.16 kg/m2  SpO2 97%     Assessment & Plan:   early cellulitis, suspect abscess. Exam is quite limited due to exquisite pain to palpation. Since I   felt some fluctuance, I recommend local anesthesia and try I&D, patient agrees.  Procedure note: Under local anesthesia with approximately 3 mL of lidocaine 2% without, I got a very small incision about 8 mm, I was able to get approximately 5-6 mL's of purulent material. Patient tolerated the procedure well although she seemed to be in more pain than the average patient, in fact at some point I offered a ER referral for possible sedation but she declined. Culture sent  Plan: warm compress, clindamycin, ER if fever, chills  or the area gets worse. Checkup in one week. Request pain meds, states she tolerated vicodin, rx printed

## 2015-08-15 LAB — WOUND CULTURE: GRAM STAIN: NONE SEEN

## 2015-08-18 ENCOUNTER — Telehealth: Payer: Self-pay | Admitting: Family Medicine

## 2015-08-18 NOTE — Telephone Encounter (Signed)
Spoke with Pt, informed her of Dr. Birdie Riddle and Dr. Larose Kells recommendations. Pt verbalized understanding.

## 2015-08-18 NOTE — Telephone Encounter (Signed)
Caller name: Anye Brose  Relationship to patient: Self   Can be reached: [phone]  Pharmacy:  Reason for call: pt would like a call back with her lab results.

## 2015-08-18 NOTE — Telephone Encounter (Signed)
Spoke with Dr. Birdie Riddle, Pt's PCP, informed to hold Abx, okay to do neosporin, and hydrogen peroxide. If fatigue continues, needs to be seen by PCP.

## 2015-08-18 NOTE — Telephone Encounter (Signed)
I think pt means wound culture from 8/12. Please advise on results if received.

## 2015-08-18 NOTE — Telephone Encounter (Signed)
Spoke with Pt, informed her of culture results. Pt had to cancel 1 week F/U appt for tomorrow because she can not afford 50 dollar co-pay. She denies having fever, chills, N/V, but is feeling somewhat fatigued which she thought was normal due to possible infection, but is getting somewhat better, fatigue was the worst on Tuesday, August 16. She has only been able to take several Clindamycin tablets as she is having sores in her mouth, and wanted to know if she should be prescribed something else or another antibiotic, sores are not currently bothering her since she had stopped Clindamycin at first sign of side effect. She also wanted to know if she can put Neosporin or any other topical healing medication on the area to help with healing, she has been cleaning area with alcohol wipes when showering. I informed her that I would add Clindamycin to her allergy list and send message to Dr. Larose Kells in regards to further advice. Pt verbalized understanding.

## 2015-08-18 NOTE — Telephone Encounter (Signed)
1. I don't know if she is allergic to clindamycin. If she did not  have generalized itching, hives, lip or tongue swelling I recommend her to go back on it for a few days. She has a number of allergies listed and her options for antibiotics are extremely limited.    2. Okay to put Neosporin OTC antibiotics ointments.  3. I don't know that the infection is causing fatigue, needs to be seen by PCP

## 2015-08-19 ENCOUNTER — Ambulatory Visit: Payer: BLUE CROSS/BLUE SHIELD | Admitting: Internal Medicine

## 2015-09-06 ENCOUNTER — Other Ambulatory Visit: Payer: Self-pay | Admitting: General Practice

## 2015-09-06 NOTE — Telephone Encounter (Signed)
Last OV 04-21-15 Lorazepam last filled 08-03-15 #30 with 0

## 2015-09-07 MED ORDER — LORAZEPAM 0.5 MG PO TABS: 0.5000 mg | ORAL_TABLET | Freq: Two times a day (BID) | ORAL | Status: DC | PRN

## 2015-09-07 NOTE — Telephone Encounter (Signed)
Medication filled to pharmacy as requested.   

## 2015-09-21 ENCOUNTER — Telehealth: Payer: Self-pay | Admitting: Family Medicine

## 2015-09-21 MED ORDER — IBUPROFEN 800 MG PO TABS: 800.0000 mg | ORAL_TABLET | Freq: Three times a day (TID) | ORAL | Status: DC | PRN

## 2015-09-21 NOTE — Telephone Encounter (Signed)
Medication filled to pharmacy as requested.   

## 2015-09-21 NOTE — Telephone Encounter (Signed)
Caller name: Donna Sullivan    Relationship to patient: Self    Can be reached: [phone]   Pharmacy: [facility], [address]  Reason for call: Pt is requesting a refill on her ibuprofen Rx.

## 2015-10-12 ENCOUNTER — Other Ambulatory Visit: Payer: Self-pay | Admitting: General Practice

## 2015-10-12 MED ORDER — LORAZEPAM 0.5 MG PO TABS: 0.5000 mg | ORAL_TABLET | Freq: Two times a day (BID) | ORAL | Status: DC | PRN

## 2015-10-12 NOTE — Telephone Encounter (Signed)
Last OV 04-21-15 (bronchitis) Lorazepam last filled 09-07-15 #30 with 0

## 2015-10-12 NOTE — Telephone Encounter (Signed)
Medication filled to pharmacy as requested.   

## 2015-11-15 ENCOUNTER — Other Ambulatory Visit: Payer: Self-pay | Admitting: General Practice

## 2015-11-15 NOTE — Telephone Encounter (Signed)
Last OV 04/21/15 Lorazepam last filled 10/01/15 #30 with 0  [facility] on file, UTD

## 2015-11-16 MED ORDER — LORAZEPAM 0.5 MG PO TABS: 0.5000 mg | ORAL_TABLET | Freq: Two times a day (BID) | ORAL | Status: DC | PRN

## 2015-11-16 NOTE — Telephone Encounter (Signed)
Medication filled to pharmacy as requested.   

## 2015-12-13 ENCOUNTER — Other Ambulatory Visit: Payer: Self-pay | Admitting: Family Medicine

## 2015-12-13 NOTE — Telephone Encounter (Signed)
Last OV 04/21/15 (bronchitis) Lorazepam last filled 11-16-15 #30 with 0

## 2015-12-14 ENCOUNTER — Other Ambulatory Visit: Payer: Self-pay | Admitting: General Practice

## 2015-12-14 MED ORDER — IBUPROFEN 800 MG PO TABS: 800.0000 mg | ORAL_TABLET | Freq: Three times a day (TID) | ORAL | Status: DC | PRN

## 2015-12-14 NOTE — Telephone Encounter (Signed)
Medication filled to pharmacy as requested.   

## 2015-12-19 ENCOUNTER — Other Ambulatory Visit: Payer: Self-pay | Admitting: Physician Assistant

## 2015-12-19 NOTE — Telephone Encounter (Signed)
Will defer further refills of patient's medications to PCP  

## 2015-12-20 NOTE — Telephone Encounter (Signed)
Medication filled to pharmacy as requested.   

## 2016-01-23 ENCOUNTER — Telehealth: Payer: Self-pay | Admitting: Family Medicine

## 2016-01-23 NOTE — Telephone Encounter (Signed)
Ok for #30, needs UDS 

## 2016-01-23 NOTE — Telephone Encounter (Signed)
Last office visit 08-12-15 Last fill 12-14-15 #30 with 0 rf 10-27-14 UDS  Requesting refill on Ativan 0.5 mg

## 2016-01-23 NOTE — Telephone Encounter (Signed)
Caller name: Self  Can be reached: [phone]  Pharmacy:  [facility], [place]. [phone] (Phone) [phone] (Fax)        Reason for call: Refill LORazepam (ATIVAN) 0.5 MG tablet [id number]

## 2016-01-23 NOTE — Telephone Encounter (Signed)
Last office visit was 08-12-15  Last fill 12-14-15  #30 with 0 rf 10-27-14 UDS  Requestng ativan 0.5mg 

## 2016-01-24 ENCOUNTER — Other Ambulatory Visit: Payer: Self-pay | Admitting: Family Medicine

## 2016-01-24 ENCOUNTER — Other Ambulatory Visit: Payer: Self-pay

## 2016-01-24 DIAGNOSIS — Z79899 Other long term (current) drug therapy: Secondary | ICD-10-CM

## 2016-01-24 MED ORDER — LORAZEPAM 0.5 MG PO TABS: 0.5000 mg | ORAL_TABLET | Freq: Two times a day (BID) | ORAL | Status: DC | PRN

## 2016-01-24 NOTE — Telephone Encounter (Signed)
Rx printed for patient. Will need UDS prior to pick up. Note placed on Rx. Left message for patient to pick up Rx at front office.

## 2016-01-27 MED FILL — LORazepam 0.5 MG TABS: 0.5 | 15 days supply | Qty: 30 | Fill #0

## 2016-02-27 ENCOUNTER — Telehealth: Payer: Self-pay | Admitting: Family Medicine

## 2016-02-27 NOTE — Telephone Encounter (Signed)
Chart updated to reflect 

## 2016-02-27 NOTE — Telephone Encounter (Signed)
Pt states she generally does not take the flu shot

## 2016-03-02 ENCOUNTER — Ambulatory Visit (INDEPENDENT_AMBULATORY_CARE_PROVIDER_SITE_OTHER): Payer: 59 | Admitting: Family Medicine

## 2016-03-02 ENCOUNTER — Encounter: Payer: Self-pay | Admitting: Family Medicine

## 2016-03-02 ENCOUNTER — Ambulatory Visit (HOSPITAL_BASED_OUTPATIENT_CLINIC_OR_DEPARTMENT_OTHER)
Admission: RE | Admit: 2016-03-02 | Discharge: 2016-03-02 | Disposition: A | Discharge status: Home / Self Care | Payer: 59 | Source: Ambulatory Visit | Attending: Family Medicine | Admitting: Family Medicine

## 2016-03-02 ENCOUNTER — Other Ambulatory Visit: Payer: Self-pay | Admitting: General Practice

## 2016-03-02 VITALS — BP 124/82 | HR 99 | Temp 98.1°F | Resp 16 | Ht 62.0 in | Wt 208.2 lb

## 2016-03-02 DIAGNOSIS — M47896 Other spondylosis, lumbar region: Secondary | ICD-10-CM | POA: Insufficient documentation

## 2016-03-02 DIAGNOSIS — M5416 Radiculopathy, lumbar region: Secondary | ICD-10-CM | POA: Diagnosis not present

## 2016-03-02 DIAGNOSIS — G44209 Tension-type headache, unspecified, not intractable: Secondary | ICD-10-CM | POA: Diagnosis not present

## 2016-03-02 MED ORDER — METHOCARBAMOL 750 MG PO TABS: 750.0000 mg | ORAL_TABLET | Freq: Three times a day (TID) | ORAL | Status: DC

## 2016-03-02 MED ORDER — LORAZEPAM 0.5 MG PO TABS: 0.5000 mg | ORAL_TABLET | Freq: Two times a day (BID) | ORAL | Status: DC | PRN

## 2016-03-02 MED ORDER — METHYLPREDNISOLONE ACETATE 80 MG/ML IJ SUSP
80.0000 mg | Freq: Once | INTRAMUSCULAR | Status: AC
  Administered 2016-03-02: 80 mg via INTRAMUSCULAR

## 2016-03-02 MED ORDER — PREDNISONE 10 MG PO TABS: ORAL_TABLET | ORAL | Status: DC

## 2016-03-02 MED ORDER — CYCLOBENZAPRINE HCL 10 MG PO TABS: 10.0000 mg | ORAL_TABLET | Freq: Three times a day (TID) | ORAL | Status: DC | PRN

## 2016-03-02 MED FILL — predniSONE 10 MG TABS: 10 | 9 days supply | Qty: 18 | Fill #0

## 2016-03-02 MED FILL — METHOCARBAMOL 750 MG TABLET: 750 | 15 days supply | Qty: 45 | Fill #0

## 2016-03-02 MED FILL — LORazepam 0.5 MG TABS: 0.5 | 15 days supply | Qty: 30 | Fill #0

## 2016-03-02 NOTE — Patient Instructions (Signed)
Follow up as needed Go downstairs and get your imaging done Start the Prednisone as directed tomorrow w/ breakfast Use the flexeril as needed for spasm- will cause drowsiness Heating pad on the neck/back for pain/spasm Call with any questions or concerns If you want to join [place] at the new [place] office, any scheduled appointments will automatically transfer and we will see you at [address] [place] [address] [place], [place], [place] [address] ([place]) [place] in there!!!

## 2016-03-02 NOTE — Progress Notes (Signed)
   Subjective:    Patient ID: Donna Sullivan, female    DOB: 08-28-1966, 50 y.o.   MRN: [id number]  HPI Lumbar back pain- ongoing issue for pt but worsening in the last week.  Improves w/ activity, worse first thing in the AM and lying flat.  Difficult to sit still due to pain.  Pain will radiate into R thigh.  No known injury no previous imaging.  L jaw pain- pt reports pain posterior to angle of mandible on L.  Pain is constant- no change w/ opening or closing jaw.  Pain is improving.  Had pain for last 3 days.  HA- starts at base of neck and radiates forward to front of head.  sxs started ~1 week ago.  Taking ibuprofen 3x/day w/o improvement.  Pt denies increased stress recently.  No sensitivity to light or sound.   Review of Systems For ROS see HPI     Objective:   Physical Exam  Constitutional: She is oriented to person, place, and time. She appears well-developed and well-nourished. No distress.  HENT:  Head: Normocephalic and atraumatic.  No TTP over mandible No submandibular lymphadenopathy  Eyes: Conjunctivae and EOM are normal. Pupils are equal, round, and reactive to light.  Neck: Normal range of motion. Neck supple.  Cardiovascular: Intact distal pulses.   Musculoskeletal: She exhibits no tenderness (no TTP over lumbar spine or paraspinal muscles).  Good back flexion, pain w/ extension  Lymphadenopathy:    She has no cervical adenopathy.  Neurological: She is alert and oriented to person, place, and time. She has normal reflexes. No cranial nerve deficit. Coordination normal.  (-) SLR bilaterally  Skin: Skin is warm and dry.  Vitals reviewed.         Assessment & Plan:

## 2016-03-02 NOTE — Assessment & Plan Note (Signed)
New to provider, ongoing for pt.  (-) SLR.  No bony tenderness on exam.  Pain w/ extension>flexion.  Pt has not had any imaging done.  Get xrays.  Start pred taper after depo-medrol injxn in office today.  Robaxin prn.  Reviewed supportive care and red flags that should prompt return.  Pt expressed understanding and is in agreement w/ plan.

## 2016-03-02 NOTE — Assessment & Plan Note (Signed)
New.  Pt's sxs are consistent w/ tension HA- not migraine.  No pain or trap spasm on PE.  Prednisone taper should improve current sxs as well as Robaxin for muscle spasm.  Heating pad.  Reviewed supportive care and red flags that should prompt return.  Pt expressed understanding and is in agreement w/ plan.

## 2016-03-02 NOTE — Progress Notes (Signed)
Pre visit review using our clinic review tool, if applicable. No additional management support is needed unless otherwise documented below in the visit note. 

## 2016-03-05 ENCOUNTER — Telehealth: Payer: Self-pay | Admitting: Family Medicine

## 2016-03-05 NOTE — Telephone Encounter (Signed)
Pt states she have not slept since Saturday night. She is thinking it's from the prednisone. Is it ok for her to skip a dose? Please call pt at [phone].

## 2016-03-05 NOTE — Telephone Encounter (Signed)
Pt notified and expressed an understanding.  

## 2016-03-05 NOTE — Telephone Encounter (Signed)
Ok to skip a dose.  This is a common side effect of Prednisone

## 2016-03-07 DIAGNOSIS — Z0289 Encounter for other administrative examinations: Secondary | ICD-10-CM

## 2016-03-22 ENCOUNTER — Telehealth: Payer: Self-pay | Admitting: Family Medicine

## 2016-03-22 MED ORDER — IBUPROFEN 800 MG PO TABS: 800.0000 mg | ORAL_TABLET | Freq: Three times a day (TID) | ORAL | Status: DC | PRN

## 2016-03-22 MED FILL — IBUPROFEN 800 MG TABLET: 800 | 20 days supply | Qty: 60 | Fill #0

## 2016-03-22 NOTE — Telephone Encounter (Signed)
Medication filled to pharmacy as requested.   

## 2016-03-22 NOTE — Telephone Encounter (Signed)
Caller name: Self  Can be reached: [phone]  Pharmacy: [facility], [place]. [phone] (Phone) [phone] (Fax)         Reason for call: Needs refill on  ibuprofen (ADVIL,MOTRIN) 800 MG tablet [id number]

## 2016-04-10 ENCOUNTER — Telehealth: Payer: Self-pay | Admitting: Family Medicine

## 2016-04-10 MED FILL — LORazepam 0.5 MG TABS: 0.5 | 15 days supply | Qty: 30 | Fill #0

## 2016-04-10 NOTE — Telephone Encounter (Signed)
Okay #30, no refills 

## 2016-04-10 NOTE — Telephone Encounter (Signed)
Last OV 03/02/16 Lorazepam last filled 03/02/16 #30 with 0

## 2016-04-10 NOTE — Telephone Encounter (Signed)
Rx printed, awaiting MD signature.  

## 2016-04-10 NOTE — Telephone Encounter (Signed)
Rx faxed to [facility].

## 2016-04-11 ENCOUNTER — Ambulatory Visit (INDEPENDENT_AMBULATORY_CARE_PROVIDER_SITE_OTHER): Payer: 59 | Admitting: Medical

## 2016-04-11 ENCOUNTER — Encounter: Payer: Self-pay | Admitting: Medical

## 2016-04-11 VITALS — BP 122/80 | HR 68 | Temp 98.1°F | Ht 62.0 in | Wt 207.2 lb

## 2016-04-11 DIAGNOSIS — J029 Acute pharyngitis, unspecified: Secondary | ICD-10-CM | POA: Diagnosis not present

## 2016-04-11 DIAGNOSIS — R059 Cough, unspecified: Secondary | ICD-10-CM

## 2016-04-11 DIAGNOSIS — J45909 Unspecified asthma, uncomplicated: Secondary | ICD-10-CM

## 2016-04-11 DIAGNOSIS — B370 Candidal stomatitis: Secondary | ICD-10-CM | POA: Diagnosis not present

## 2016-04-11 DIAGNOSIS — F411 Generalized anxiety disorder: Secondary | ICD-10-CM

## 2016-04-11 DIAGNOSIS — R05 Cough: Secondary | ICD-10-CM | POA: Diagnosis not present

## 2016-04-11 LAB — POCT RAPID STREP A (OFFICE): RAPID STREP A SCREEN: NEGATIVE

## 2016-04-11 MED ORDER — FLUCONAZOLE 150 MG PO TABS: ORAL_TABLET | ORAL | Status: DC

## 2016-04-11 MED ORDER — AZITHROMYCIN 250 MG PO TABS: ORAL_TABLET | ORAL | Status: DC

## 2016-04-11 MED ORDER — BENZONATATE 100 MG PO CAPS: 100.0000 mg | ORAL_CAPSULE | Freq: Three times a day (TID) | ORAL | Status: DC | PRN

## 2016-04-11 MED ORDER — MAGIC MOUTHWASH: ORAL | Status: DC

## 2016-04-11 MED ORDER — LORAZEPAM 0.5 MG PO TABS: 0.5000 mg | ORAL_TABLET | Freq: Two times a day (BID) | ORAL | Status: DC | PRN

## 2016-04-11 MED FILL — FLUCONAZOLE 150 MG TABLET: 150 | 6 days supply | Qty: 3 | Fill #0

## 2016-04-11 MED FILL — MAGIC MW LID/MAAL/DP1:1:1: 2 | 10 days supply | Qty: 200 | Fill #0

## 2016-04-11 MED FILL — AZITHROMYCIN 250 MG TABLET: 250 | 5 days supply | Qty: 6 | Fill #0

## 2016-04-11 MED FILL — BENZONATATE 100 MG CAPSULE: 100 | 7 days supply | Qty: 21 | Fill #0

## 2016-04-11 NOTE — Patient Instructions (Addendum)
For probable strep by exam will rx azithromycin.  For possible thrush as well and hx of while on antibiotics so  will rx diflucan.  For mouth pain. Magic mouthwash.  For cough rx benzonatate.  Stay on inhalers for your asthma. You are stable and want to avoid use of prednisone.  Refill you ativan for your anxiety.  Follow up in 10 days or as needed

## 2016-04-11 NOTE — Progress Notes (Signed)
Pre visit review using our clinic review tool, if applicable. No additional management support is needed unless otherwise documented below in the visit note. 

## 2016-04-11 NOTE — Progress Notes (Signed)
Subjective:    Patient ID: Donna Sullivan, female    DOB: [birth year], 50 y.o.   MRN: [id number]  HPI   Pt has sore throat for 2 days. But sick for 2 weeks before  with cough, sneezing, and ear pain.   Pt has not taken any antibiotic recently.  No recent medications.  Pt is coughing up mucous.   Pt is on qvar daily. Was using albuterol last week. But not this week.   3 weeks ago she was prednisone.   Review of Systems  Constitutional: Negative for chills.  HENT: Positive for congestion, sneezing and sore throat.   Eyes: Negative for photophobia and pain.  Respiratory: Negative for cough, choking and chest tightness.   Cardiovascular: Negative for chest pain and palpitations.  Gastrointestinal: Negative for abdominal pain.  Musculoskeletal: Negative for back pain.  Neurological: Negative for dizziness, speech difficulty, weakness, numbness and headaches.  Hematological: Negative for adenopathy. Does not bruise/bleed easily.  Psychiatric/Behavioral: Negative for behavioral problems and confusion. The patient is nervous/anxious.    Past Medical History  Diagnosis Date  . Discoid lupus     Dr Ronnald Ramp, dermatologist  . Anxiety   . Asthma   . GERD (gastroesophageal reflux disease)   . Chronic kidney disease 1985    BLOCKED TUBE IN KIDNEY  . Seizures ([place])     EPILEPSY- NO SEIZURES SINCE AGE 57  . Headache(784.0)     MIGRAINES  . Insomnia   . Tendonitis     Social History   Social History  . Marital Status: Married    Spouse Name: N/A  . Number of Children: N/A  . Years of Education: N/A   Occupational History  . Not on file.   Social History Main Topics  . Smoking status: Current Every Day Smoker -- 0.50 packs/day    Types: Cigarettes  . Smokeless tobacco: Never Used     Comment: Maybe later  . Alcohol Use: Yes     Comment: occ  . Drug Use: No  . Sexual Activity: Not on file   Other Topics Concern  . Not on file   Social History Narrative   Lives with  daughter.    Past Surgical History  Procedure Laterality Date  . Appendectomy    . Kidney surgery      right, straightened tube  . Right oophorectomy    . Ankle reconstruction  04/2009  . Other surgical history      Hysterectomy with 10cm grown removal, Dr. Willis Modena  . Abdominal hysterectomy    . Cesarean section    . Cholecystectomy    . Diagnostic laparoscopy    . Laparoscopy  09/23/2012    Procedure: LAPAROSCOPY OPERATIVE;  Surgeon: Cheri Fowler, MD;  Location: [facility];  Service: Gynecology;  Laterality: N/A;    Family History  Problem Relation Age of Onset  . Coronary artery disease Mother   . Hypertension Mother   . Hypertension Father   . Colon cancer      grandfather    Allergies  Allergen Reactions  . Symbicort [Budesonide-Formoterol Fumarate] Other (See Comments)    Caused open sores in mouth   . Tramadol Shortness Of Breath  . Amoxicillin-Pot Clavulanate     REACTION: dizziness, fatigue/drowsiness and nausea  . Bactrim [Sulfamethoxazole-Trimethoprim]     Blister in mouth  . Ciprofloxacin Hcl     Blisters in mouth  . Clindamycin/Lincomycin Other (See Comments)    Sores in mouth   . Doxycycline  Fatigue, sleepy, worse   . Keflex [Cephalexin]     Blister in mouth  . Wellbutrin [Bupropion]     Blisters in mouth    Current Outpatient Prescriptions on File Prior to Visit  Medication Sig Dispense Refill  . ibuprofen (ADVIL,MOTRIN) 800 MG tablet Take 1 tablet (800 mg total) by mouth every 8 (eight) hours as needed for mild pain. 60 tablet 1  . LORazepam (ATIVAN) 0.5 MG tablet Take 1 tablet (0.5 mg total) by mouth 2 (two) times daily as needed for anxiety. 30 tablet 0  . methocarbamol (ROBAXIN-750) 750 MG tablet Take 1 tablet (750 mg total) by mouth 3 (three) times daily. 45 tablet 0  . QVAR 40 MCG/ACT inhaler     . VENTOLIN HFA 108 (90 BASE) MCG/ACT inhaler INHALE 1-2 PUFFS INTO THE LUNGS EVERY 6 HOURS AS NEEDED FOR WHEEZING OR SHORTNESS OF BREATH 18 g 3     No current facility-administered medications on file prior to visit.    BP 122/80 mmHg  Pulse 68  Temp(Src) 98.1 F (36.7 C) (Oral)  Ht 5\' 2"  (1.575 m)  Wt 207 lb 3.2 oz (93.985 kg)  BMI 37.89 kg/m2  SpO2 97%       Objective:   Physical Exam  General  Mental Status - Alert. General Appearance - Well groomed. Not in acute distress.  Skin Rashes- No Rashes.  HEENT Head- Normal. Ear Auditory Canal - Left- Normal. Right - Normal.Tympanic Membrane- Left- Normal. Right- Normal. Eye Sclera/Conjunctiva- Left- Normal. Right- Normal. Nose & Sinuses Nasal Mucosa- Left-  Boggy and Congested. Right-  Boggy and  Congested.Bilateral maxillary and frontal sinus pressure. Mouth & Throat Lips: Upper Lip- Normal: no dryness, cracking, pallor, cyanosis, or vesicular eruption. Lower Lip-Normal: no dryness, cracking, pallor, cyanosis or vesicular eruption. Buccal Mucosa- Bilateral- No Aphthous ulcers. Faint white count. Oropharynx- No Discharge or Erythema. Tonsils: Characteristics- Bilateral- bright  Erythema posterior  and Congestion. Size/Enlargement- Bilateral- no  enlargement. Discharge- bilateral-None.  Neck Neck- Supple. No Masses.bilateral 1+ submandibular enlargement.   Chest and Lung Exam Auscultation: Breath Sounds:-Clear even and unlabored.  Cardiovascular Auscultation:Rythm- Regular, rate and rhythm. Murmurs & Other Heart Sounds:Ausculatation of the heart reveal- No Murmurs.  Lymphatic Head & Neck General Head & Neck Lymphatics: Bilateral: Description- No Localized lymphadenopathy. See neck exam.       Assessment & Plan:  For probable strep by exam will rx azithromycin.  For possible thrush as well and hx of while on antibiotics so will rx diflucan.  For mouth pain. Magic mouthwash.  For cough rx benzonatate.  Stay on inhalers for your asthma. You are stable and want to avoid use of prednisone.  Refill you ativan for your anxiety.  Follow up in 10 days  or as needed

## 2016-04-11 NOTE — Addendum Note (Signed)
Addended by: Tasia Catchings on: 04/11/2016 06:39 PM   Modules accepted: Medications

## 2016-04-20 ENCOUNTER — Telehealth: Payer: Self-pay | Admitting: Family Medicine

## 2016-04-20 NOTE — Telephone Encounter (Signed)
Tried calling pt back on the number listed below as well as the main number in the chart to get more information on symptoms. Phone kept giving a busy signal. Message routed to both PCP and Mackie Pai CMA to see if we can get in touch with her.

## 2016-04-20 NOTE — Telephone Encounter (Signed)
Caller name: Self  Can be reached: [phone]   Reason for call: Patient saw Percell Miller 4/12 for coughing and sneezing. Completed antibiotics but still having symptoms. Plse adv

## 2016-04-20 NOTE — Telephone Encounter (Signed)
Agree- nothing further can be done until we have more information

## 2016-04-20 NOTE — Telephone Encounter (Signed)
Tried to reach patient again.  Unable to do so with numbers listed.  Left a message on [phone] for call back.

## 2016-04-23 NOTE — Telephone Encounter (Signed)
Called and tried reaching pt again, LMOVM to return call. Message closed until pt returns call.

## 2016-04-24 ENCOUNTER — Ambulatory Visit (INDEPENDENT_AMBULATORY_CARE_PROVIDER_SITE_OTHER): Payer: 59 | Admitting: Family Medicine

## 2016-04-24 ENCOUNTER — Encounter: Payer: Self-pay | Admitting: Family Medicine

## 2016-04-24 VITALS — BP 124/82 | HR 77 | Temp 98.0°F | Ht 62.0 in | Wt 210.1 lb

## 2016-04-24 DIAGNOSIS — M5416 Radiculopathy, lumbar region: Secondary | ICD-10-CM | POA: Diagnosis not present

## 2016-04-24 DIAGNOSIS — J302 Other seasonal allergic rhinitis: Secondary | ICD-10-CM | POA: Diagnosis not present

## 2016-04-24 MED ORDER — ACYCLOVIR 400 MG PO TABS: 400.0000 mg | ORAL_TABLET | Freq: Every day | ORAL | Status: DC

## 2016-04-24 MED FILL — ACYCLOVIR 400 MG TABLET: 400 | 7 days supply | Qty: 35 | Fill #0

## 2016-04-24 NOTE — Patient Instructions (Addendum)
Salonpas with lidocaine and Aspercreme.  NOW Probiotic Vitamin. Available at [employer]. Clean with witch hazel twice daily.    Shingles Shingles, which is also known as herpes zoster, is an infection that causes a painful skin rash and fluid-filled blisters. Shingles is not related to genital herpes, which is a sexually transmitted infection.   Shingles only develops in people who:  Have had chickenpox.  Have received the chickenpox vaccine. (This is rare.) CAUSES Shingles is caused by varicella-zoster virus (VZV). This is the same virus that causes chickenpox. After exposure to VZV, the virus stays in the body in an inactive (dormant) state. Shingles develops if the virus reactivates. This can happen many years after the initial exposure to VZV. It is not known what causes this virus to reactivate. RISK FACTORS People who have had chickenpox or received the chickenpox vaccine are at risk for shingles. Infection is more common in people who:  Are older than age 20.  Have a weakened defense (immune) system, such as those with HIV, AIDS, or cancer.  Are taking medicines that weaken the immune system, such as transplant medicines.  Are under great stress. SYMPTOMS Early symptoms of this condition include itching, tingling, and pain in an area on your skin. Pain may be described as burning, stabbing, or throbbing. A few days or weeks after symptoms start, a painful red rash appears, usually on one side of the body in a bandlike or beltlike pattern. The rash eventually turns into fluid-filled blisters that break open, scab over, and dry up in about 2-3 weeks. At any time during the infection, you may also develop:  A fever.  Chills.  A headache.  An upset stomach. DIAGNOSIS This condition is diagnosed with a skin exam. Sometimes, skin or fluid samples are taken from the blisters before a diagnosis is made. These samples are examined under a microscope or sent to a lab for  testing. TREATMENT There is no specific cure for this condition. Your health care provider will probably prescribe medicines to help you manage pain, recover more quickly, and avoid long-term problems. Medicines may include:  Antiviral drugs.  Anti-inflammatory drugs.  Pain medicines. If the area involved is on your face, you may be referred to a specialist, such as an eye doctor (ophthalmologist) or an ear, nose, and throat (ENT) doctor to help you avoid eye problems, chronic pain, or disability. HOME CARE INSTRUCTIONS Medicines  Take medicines only as directed by your health care provider.  Apply an anti-itch or numbing cream to the affected area as directed by your health care provider. Blister and Rash Care  Take a cool bath or apply cool compresses to the area of the rash or blisters as directed by your health care provider. This may help with pain and itching.  Keep your rash covered with a loose bandage (dressing). Wear loose-fitting clothing to help ease the pain of material rubbing against the rash.  Keep your rash and blisters clean with mild soap and cool water or as directed by your health care provider.  Check your rash every day for signs of infection. These include redness, swelling, and pain that lasts or increases.  Do not pick your blisters.  Do not scratch your rash. General Instructions  Rest as directed by your health care provider.  Keep all follow-up visits as directed by your health care provider. This is important.  Until your blisters scab over, your infection can cause chickenpox in people who have never had it or  been vaccinated against it. To prevent this from happening, avoid contact with other people, especially:  Babies.  Pregnant women.  Children who have eczema.  Elderly people who have transplants.  People who have chronic illnesses, such as leukemia or AIDS. SEEK MEDICAL CARE IF:  Your pain is not relieved with prescribed  medicines.  Your pain does not get better after the rash heals.  Your rash looks infected. Signs of infection include redness, swelling, and pain that lasts or increases. SEEK IMMEDIATE MEDICAL CARE IF:  The rash is on your face or nose.  You have facial pain, pain around your eye area, or loss of feeling on one side of your face.  You have ear pain or you have ringing in your ear.  You have loss of taste.  Your condition gets worse.   This information is not intended to replace advice given to you by your health care provider. Make sure you discuss any questions you have with your health care provider.   Document Released: 12/17/2005 Document Revised: 01/07/2015 Document Reviewed: 10/28/2014 Elsevier Interactive Patient Education [employer].

## 2016-04-24 NOTE — Progress Notes (Signed)
Subjective:    Patient ID: Donna Sullivan, female    DOB: September 12, 1966, 50 y.o.   MRN: [id number]  Chief Complaint  Patient presents with  . Cyst    HPI Patient is in today for  Cyst on right lower quadrant of abdomen.  Patient reports some numbness in the area, appears to be round and red.  Patient was in last week for strep throat, and has been treated and symptoms of strep has subsided.  Denies CP/palp/SOB/HA/congestion/fevers/GI or GU c/o. Taking meds as prescribed  Past Medical History  Diagnosis Date  . Discoid lupus     Dr Ronnald Ramp, dermatologist  . Anxiety   . Asthma   . GERD (gastroesophageal reflux disease)   . Chronic kidney disease 1985    BLOCKED TUBE IN KIDNEY  . Seizures ([place])     EPILEPSY- NO SEIZURES SINCE AGE 22  . Headache(784.0)     MIGRAINES  . Insomnia   . Tendonitis     Past Surgical History  Procedure Laterality Date  . Appendectomy    . Kidney surgery      right, straightened tube  . Right oophorectomy    . Ankle reconstruction  04/2009  . Other surgical history      Hysterectomy with 10cm grown removal, Dr. Willis Modena  . Abdominal hysterectomy    . Cesarean section    . Cholecystectomy    . Diagnostic laparoscopy    . Laparoscopy  09/23/2012    Procedure: LAPAROSCOPY OPERATIVE;  Surgeon: Cheri Fowler, MD;  Location: [facility];  Service: Gynecology;  Laterality: N/A;    Family History  Problem Relation Age of Onset  . Coronary artery disease Mother   . Hypertension Mother   . Hypertension Father   . Colon cancer      grandfather    Social History   Social History  . Marital Status: Married    Spouse Name: N/A  . Number of Children: N/A  . Years of Education: N/A   Occupational History  . Not on file.   Social History Main Topics  . Smoking status: Current Every Day Smoker -- 0.50 packs/day    Types: Cigarettes  . Smokeless tobacco: Never Used     Comment: Maybe later  . Alcohol Use: Yes     Comment: occ  . Drug Use: No  . Sexual  Activity: Not on file   Other Topics Concern  . Not on file   Social History Narrative   Lives with daughter.    Outpatient Prescriptions Prior to Visit  Medication Sig Dispense Refill  . ibuprofen (ADVIL,MOTRIN) 800 MG tablet Take 1 tablet (800 mg total) by mouth every 8 (eight) hours as needed for mild pain. 60 tablet 1  . LORazepam (ATIVAN) 0.5 MG tablet Take 1 tablet (0.5 mg total) by mouth 2 (two) times daily as needed for anxiety. 30 tablet 0  . magic mouthwash SOLN 5 ml po qid swish and spit. 200 mL 0  . methocarbamol (ROBAXIN-750) 750 MG tablet Take 1 tablet (750 mg total) by mouth 3 (three) times daily. 45 tablet 0  . QVAR 40 MCG/ACT inhaler     . VENTOLIN HFA 108 (90 BASE) MCG/ACT inhaler INHALE 1-2 PUFFS INTO THE LUNGS EVERY 6 HOURS AS NEEDED FOR WHEEZING OR SHORTNESS OF BREATH 18 g 3  . azithromycin (ZITHROMAX) 250 MG tablet Take 2 tablets by mouth on day 1, followed by 1 tablet by mouth daily for 4 days. 6 tablet 0  .  benzonatate (TESSALON) 100 MG capsule Take 1 capsule (100 mg total) by mouth 3 (three) times daily as needed. 21 capsule 0  . fluconazole (DIFLUCAN) 150 MG tablet 1 tab po day 1, then another day 3, and then 1 tab po on day 6. 3 tablet 0   No facility-administered medications prior to visit.    Allergies  Allergen Reactions  . Symbicort [Budesonide-Formoterol Fumarate] Other (See Comments)    Caused open sores in mouth   . Tramadol Shortness Of Breath  . Amoxicillin-Pot Clavulanate     REACTION: dizziness, fatigue/drowsiness and nausea  . Bactrim [Sulfamethoxazole-Trimethoprim]     Blister in mouth  . Ciprofloxacin Hcl     Blisters in mouth  . Clindamycin/Lincomycin Other (See Comments)    Sores in mouth   . Doxycycline     Fatigue, sleepy, worse   . Keflex [Cephalexin]     Blister in mouth  . Wellbutrin [Bupropion]     Blisters in mouth    Review of Systems  Constitutional: Negative for fever and malaise/fatigue.  HENT: Negative for  congestion.   Eyes: Negative for blurred vision.  Respiratory: Negative for shortness of breath.   Cardiovascular: Negative for chest pain, palpitations and leg swelling.  Gastrointestinal: Negative for nausea, abdominal pain and blood in stool.  Genitourinary: Negative for dysuria and frequency.  Musculoskeletal: Negative for falls.  Skin: Positive for rash.  Neurological: Negative for dizziness, loss of consciousness and headaches.  Endo/Heme/Allergies: Negative for environmental allergies.  Psychiatric/Behavioral: Negative for depression. The patient is not nervous/anxious.        Objective:    Physical Exam  BP 124/82 mmHg  Pulse 77  Temp(Src) 98 F (36.7 C) (Oral)  Ht 5\' 2"  (1.575 m)  Wt 210 lb 2 oz (95.312 kg)  BMI 38.42 kg/m2  SpO2 96% Wt Readings from Last 3 Encounters:  04/24/16 210 lb 2 oz (95.312 kg)  04/11/16 207 lb 3.2 oz (93.985 kg)  03/02/16 208 lb 4 oz (94.462 kg)     Lab Results  Component Value Date   WBC 12.4* 04/13/2015   HGB 15.0 04/13/2015   HCT 45.5 04/13/2015   PLT 162 04/13/2015   GLUCOSE 136* 04/13/2015   CHOL 221* 04/10/2013   TRIG 216* 04/10/2013   HDL 42 04/10/2013   LDLCALC 136* 04/10/2013   ALT 60* 04/10/2013   AST 73* 04/10/2013   NA 136 04/13/2015   K 3.6 04/13/2015   CL 107 04/13/2015   CREATININE 0.60 04/13/2015   BUN 8 04/13/2015   CO2 23 04/13/2015   TSH 2.011 04/10/2013    Lab Results  Component Value Date   TSH 2.011 04/10/2013   Lab Results  Component Value Date   WBC 12.4* 04/13/2015   HGB 15.0 04/13/2015   HCT 45.5 04/13/2015   MCV 93.2 04/13/2015   PLT 162 04/13/2015   Lab Results  Component Value Date   NA 136 04/13/2015   K 3.6 04/13/2015   CO2 23 04/13/2015   GLUCOSE 136* 04/13/2015   BUN 8 04/13/2015   CREATININE 0.60 04/13/2015   BILITOT 0.8 04/10/2013   ALKPHOS 80 04/10/2013   AST 73* 04/10/2013   ALT 60* 04/10/2013   PROT 7.3 04/10/2013   ALBUMIN 3.9 04/10/2013   CALCIUM 8.4 04/13/2015     ANIONGAP 6 04/13/2015   GFR 112.63 05/13/2012   Lab Results  Component Value Date   CHOL 221* 04/10/2013   Lab Results  Component Value Date   HDL  42 04/10/2013   Lab Results  Component Value Date   LDLCALC 136* 04/10/2013   Lab Results  Component Value Date   TRIG 216* 04/10/2013   Lab Results  Component Value Date   CHOLHDL 5.3 04/10/2013   No results found for: HGBA1C     Assessment & Plan:   Problem List Items Addressed This Visit    None      I have discontinued Ms. Kaneko's azithromycin, fluconazole, and benzonatate. I am also having her maintain her VENTOLIN HFA, QVAR, methocarbamol, ibuprofen, magic mouthwash, and LORazepam.  No orders of the defined types were placed in this encounter.     Jan Fireman, [place]

## 2016-04-24 NOTE — Progress Notes (Signed)
Pre visit review using our clinic review tool, if applicable. No additional management support is needed unless otherwise documented below in the visit note. 

## 2016-04-26 ENCOUNTER — Telehealth: Payer: Self-pay | Admitting: Family Medicine

## 2016-04-26 MED ORDER — HYDROCODONE-ACETAMINOPHEN 5-325 MG PO TABS
1.0000 | ORAL_TABLET | Freq: Four times a day (QID) | ORAL | Status: DC | PRN
Start: 2016-04-26 — End: 2016-10-05

## 2016-04-26 NOTE — Telephone Encounter (Signed)
Printed prescription and on counter for signature. Called left msg to call back.

## 2016-04-26 NOTE — Telephone Encounter (Signed)
She can have Norco 5/325 1 tab po q 6 hours prn pain #20 to help with acute pain and can alternate with Ibuprofen as needed

## 2016-04-26 NOTE — Telephone Encounter (Signed)
Caller name: Self  Can be reached: [phone]  Pharmacy:  [facility] [place] [facility] [place] [phone] (Phone) [phone] (Fax)        Reason for call: Patient saw Dr. Randel Pigg on Tuesday and was dx with Shingles. States that it is now painful and request pain medication.

## 2016-04-26 NOTE — Telephone Encounter (Signed)
Called the patient informed to pickup hardcopy at the front desk. 

## 2016-04-27 ENCOUNTER — Other Ambulatory Visit: Payer: Self-pay | Admitting: Family Medicine

## 2016-04-27 DIAGNOSIS — Z1231 Encounter for screening mammogram for malignant neoplasm of breast: Secondary | ICD-10-CM

## 2016-04-27 MED FILL — HYDROCODON-APAP 5-325: 5-325 | 5 days supply | Qty: 20 | Fill #0

## 2016-05-02 ENCOUNTER — Ambulatory Visit: Payer: 59 | Admitting: Family Medicine

## 2016-05-07 NOTE — Assessment & Plan Note (Signed)
Encouraged nasal saline flushes daily antihistamines

## 2016-05-07 NOTE — Assessment & Plan Note (Signed)
Encouraged moist heat and gentle stretching as tolerated. May try NSAIDs and prescription meds as directed and report if symptoms worsen or seek immediate care 

## 2016-05-10 ENCOUNTER — Ambulatory Visit (HOSPITAL_BASED_OUTPATIENT_CLINIC_OR_DEPARTMENT_OTHER): Payer: 59

## 2016-06-18 ENCOUNTER — Other Ambulatory Visit: Payer: Self-pay | Admitting: Medical

## 2016-06-27 ENCOUNTER — Encounter: Payer: Self-pay | Admitting: Family Medicine

## 2016-07-16 ENCOUNTER — Other Ambulatory Visit: Payer: Self-pay | Admitting: General Practice

## 2016-07-16 ENCOUNTER — Other Ambulatory Visit: Payer: Self-pay | Admitting: Emergency Medicine

## 2016-07-16 MED ORDER — LORAZEPAM 0.5 MG PO TABS: 0.5000 mg | ORAL_TABLET | Freq: Two times a day (BID) | ORAL | Status: DC | PRN

## 2016-07-16 NOTE — Telephone Encounter (Signed)
Received refill request for Lorazepam 0.5MG  TAB.   Last visit: 04/24/16 Last refill: 06/19/16  Is it ok to refill?  Please advise.

## 2016-07-25 ENCOUNTER — Other Ambulatory Visit: Payer: Self-pay | Admitting: General Practice

## 2016-07-25 MED ORDER — IBUPROFEN 800 MG PO TABS: 800.0000 mg | ORAL_TABLET | Freq: Three times a day (TID) | ORAL | 0 refills | Status: DC | PRN

## 2016-08-22 ENCOUNTER — Other Ambulatory Visit: Payer: Self-pay | Admitting: Emergency Medicine

## 2016-08-22 NOTE — Telephone Encounter (Signed)
I think I refilled this before when you were out- thanks!

## 2016-08-22 NOTE — Telephone Encounter (Signed)
Received refill request for Lorazepam 0.5 MG Tab. Last seen in the office 04/24/16. Last refill 07/16/16. Is it ok to refill? Please advise.

## 2016-08-23 MED ORDER — LORAZEPAM 0.5 MG PO TABS: 0.5000 mg | ORAL_TABLET | Freq: Two times a day (BID) | ORAL | 0 refills | Status: DC | PRN

## 2016-08-23 NOTE — Telephone Encounter (Signed)
Ok for #30 

## 2016-08-23 NOTE — Addendum Note (Signed)
Addended by: Davis Gourd on: 08/23/2016 08:48 AM   Modules accepted: Orders

## 2016-08-23 NOTE — Telephone Encounter (Signed)
Yes, pt has an appt on 08/27/16

## 2016-08-23 NOTE — Telephone Encounter (Signed)
Does pt have an appt scheduled?  I will not refill unless she has an appt

## 2016-08-23 NOTE — Telephone Encounter (Signed)
Med filled and faxed.  

## 2016-08-27 ENCOUNTER — Ambulatory Visit: Payer: 59 | Admitting: Family Medicine

## 2016-08-30 ENCOUNTER — Other Ambulatory Visit: Payer: Self-pay | Admitting: Family Medicine

## 2016-09-06 ENCOUNTER — Ambulatory Visit (INDEPENDENT_AMBULATORY_CARE_PROVIDER_SITE_OTHER): Payer: 59 | Admitting: Family Medicine

## 2016-09-06 ENCOUNTER — Encounter: Payer: Self-pay | Admitting: Family Medicine

## 2016-09-06 VITALS — BP 122/82 | HR 90 | Temp 97.9°F | Resp 17 | Ht 62.0 in | Wt 204.0 lb

## 2016-09-06 DIAGNOSIS — J42 Unspecified chronic bronchitis: Secondary | ICD-10-CM | POA: Diagnosis not present

## 2016-09-06 DIAGNOSIS — G4700 Insomnia, unspecified: Secondary | ICD-10-CM | POA: Diagnosis not present

## 2016-09-06 DIAGNOSIS — J449 Chronic obstructive pulmonary disease, unspecified: Secondary | ICD-10-CM | POA: Insufficient documentation

## 2016-09-06 DIAGNOSIS — R319 Hematuria, unspecified: Secondary | ICD-10-CM | POA: Diagnosis not present

## 2016-09-06 DIAGNOSIS — R1032 Left lower quadrant pain: Secondary | ICD-10-CM

## 2016-09-06 LAB — POCT URINALYSIS DIPSTICK
BILIRUBIN UA: NEGATIVE
GLUCOSE UA: NEGATIVE
KETONES UA: NEGATIVE
Leukocytes, UA: NEGATIVE
NITRITE UA: NEGATIVE
SPEC GRAV UA: 1.025
Urobilinogen, UA: 1
pH, UA: 6

## 2016-09-06 MED ORDER — SUVOREXANT 10 MG PO TABS: 10.0000 mg | ORAL_TABLET | Freq: Every day | ORAL | 3 refills | Status: DC

## 2016-09-06 MED ORDER — ALBUTEROL SULFATE HFA 108 (90 BASE) MCG/ACT IN AERS: INHALATION_SPRAY | RESPIRATORY_TRACT | 6 refills | Status: DC

## 2016-09-06 MED ORDER — QVAR 40 MCG/ACT IN AERS: 2.0000 | INHALATION_SPRAY | Freq: Two times a day (BID) | RESPIRATORY_TRACT | 6 refills | Status: DC

## 2016-09-06 MED ORDER — CETIRIZINE HCL 10 MG PO TABS: 10.0000 mg | ORAL_TABLET | Freq: Every day | ORAL | 11 refills | Status: DC

## 2016-09-06 NOTE — Progress Notes (Signed)
Pre visit review using our clinic review tool, if applicable. No additional management support is needed unless otherwise documented below in the visit note. 

## 2016-09-06 NOTE — Assessment & Plan Note (Signed)
New to provider, ongoing for pt.  Pt reports she has difficulty w/ sleep meds- wakes too groggy.  Will start Belsomra as she has not done well w/ Trazodone and Ambien in the past.  Coupon cards provided.

## 2016-09-06 NOTE — Patient Instructions (Signed)
Schedule your complete physical in 3-4 months We'll call you with your pelvic ultrasound for the pain Start the Belsomra nightly to improve sleep w/out feeling groggy Use the Qvar daily to prevent wheezing and chest tightness Use the Albuterol as needed for coughing/wheezing/shortness of breath Start the Zyrtec daily to help w/ allergy symptoms Call with any questions or concerns Hang in there!!!

## 2016-09-06 NOTE — Progress Notes (Signed)
   Subjective:    Patient ID: Donna Sullivan, female    DOB: January 03, 1966, 50 y.o.   MRN: [id number]  HPI Bronchitis- pt feels that she had bronchitis for the last 2 weeks and tx'd herself at home w/ albuterol nebs and inhalers 'but I'm out of all of that'.  Pt reports sore throat is improving.  Continues to have deep cough but SOB is improving.  No fevers.    LLQ pain- pain is worsening, 'it can double me over'.  Pain improves w/ movement.  Pain is intermittent, deep.  Pt reports this feels like an ovarian cyst which she has had previously.  Pain improved w/ heating pad last night.  Insomnia- ongoing issue for pt.  Lorazepam makes her sleepy but she does not sleep well.  Pt will be up to 3-4am b/c she is unable to fall asleep.  Pt reports trazodone is 'horrible'- 'i can't wake up the next day'.  Review of Systems For ROS see HPI     Objective:   Physical Exam  Constitutional: She is oriented to person, place, and time. She appears well-developed and well-nourished. No distress.  HENT:  Head: Normocephalic and atraumatic.  TMs normal bilaterally Mild nasal congestion Throat w/out erythema, edema, or exudate  Eyes: Conjunctivae and EOM are normal. Pupils are equal, round, and reactive to light.  Neck: Normal range of motion. Neck supple.  Cardiovascular: Normal rate, regular rhythm, normal heart sounds and intact distal pulses.   No murmur heard. Pulmonary/Chest: Effort normal and breath sounds normal. No respiratory distress. She has no wheezes.  + hacking cough  Abdominal: Soft. Bowel sounds are normal. She exhibits no distension. There is tenderness (mild LLQ/pelvic TTP). There is no rebound and no guarding.  Lymphadenopathy:    She has no cervical adenopathy.  Neurological: She is alert and oriented to person, place, and time.  Skin: Skin is warm and dry. No erythema.  Psychiatric: She has a normal mood and affect. Her behavior is normal. Thought content normal.  Vitals  reviewed.         Assessment & Plan:

## 2016-09-06 NOTE — Assessment & Plan Note (Signed)
Ongoing issue for pt.  sxs are resolving after home tx w/ nebs and inhalers.  No evidence of bacterial infxn on PE- no need for abx at this time.  Refill provided on Qvar and albuterol.  Pt expressed understanding and is in agreement w/ plan.

## 2016-09-06 NOTE — Assessment & Plan Note (Signed)
New.  Pt w/ hx of recurrent ovarian cysts- only L ovary remaining.  Only mild TTP over L pelvis.  No rebound/guarding.  Get [place] to assess.  No need for lab work today.  Will follow closely.

## 2016-09-07 ENCOUNTER — Other Ambulatory Visit: Payer: Self-pay | Admitting: Family Medicine

## 2016-09-07 DIAGNOSIS — R1032 Left lower quadrant pain: Secondary | ICD-10-CM

## 2016-09-07 LAB — URINE CULTURE: ORGANISM ID, BACTERIA: NO GROWTH

## 2016-09-10 ENCOUNTER — Ambulatory Visit (HOSPITAL_BASED_OUTPATIENT_CLINIC_OR_DEPARTMENT_OTHER)
Admission: RE | Admit: 2016-09-10 | Discharge: 2016-09-10 | Disposition: A | Discharge status: Home / Self Care | Payer: 59 | Source: Ambulatory Visit | Attending: Family Medicine | Admitting: Family Medicine

## 2016-09-10 ENCOUNTER — Telehealth: Payer: Self-pay | Admitting: Family Medicine

## 2016-09-10 DIAGNOSIS — R1032 Left lower quadrant pain: Secondary | ICD-10-CM

## 2016-09-10 DIAGNOSIS — Z90710 Acquired absence of both cervix and uterus: Secondary | ICD-10-CM | POA: Diagnosis not present

## 2016-09-10 DIAGNOSIS — Z1231 Encounter for screening mammogram for malignant neoplasm of breast: Secondary | ICD-10-CM | POA: Diagnosis not present

## 2016-09-10 NOTE — Telephone Encounter (Signed)
Will respond to pt as soon as results available but if her pain is worse and constant, she may need ER evaluation.

## 2016-09-10 NOTE — Telephone Encounter (Signed)
Patient calling to report her LLQ pain is much worse today than it has been.  She states the pain was intermittent but today it has been constant.  She has taken an ibuprogfn this morning and is about to take another one now. She thinks she may have over worked herself yesterday while hosting a birthday party for her father.  Patient has appt for pelvic and transvaginal ultrasounds today at 1:30pm.  Patient requesting follow up instructions once pcp receives u/s results.  I reassured patient our office would follow up with her once we had ultrasound results.

## 2016-09-10 NOTE — Telephone Encounter (Signed)
Prior Authorization for Belsomra 10 mg nightly Key:[id number] PA MR:[id number] approval thru Cover My Meds

## 2016-09-11 ENCOUNTER — Encounter (HOSPITAL_BASED_OUTPATIENT_CLINIC_OR_DEPARTMENT_OTHER): Payer: Self-pay | Admitting: Emergency Medicine

## 2016-09-11 ENCOUNTER — Emergency Department (HOSPITAL_BASED_OUTPATIENT_CLINIC_OR_DEPARTMENT_OTHER): Payer: 59

## 2016-09-11 ENCOUNTER — Emergency Department (HOSPITAL_BASED_OUTPATIENT_CLINIC_OR_DEPARTMENT_OTHER)
Admission: EM | Admit: 2016-09-11 | Discharge: 2016-09-11 | Disposition: A | Discharge status: Home / Self Care | Payer: 59 | Attending: Emergency Medicine | Admitting: Emergency Medicine

## 2016-09-11 DIAGNOSIS — J45909 Unspecified asthma, uncomplicated: Secondary | ICD-10-CM | POA: Diagnosis not present

## 2016-09-11 DIAGNOSIS — M79605 Pain in left leg: Secondary | ICD-10-CM | POA: Insufficient documentation

## 2016-09-11 DIAGNOSIS — R1032 Left lower quadrant pain: Secondary | ICD-10-CM | POA: Diagnosis not present

## 2016-09-11 DIAGNOSIS — F17210 Nicotine dependence, cigarettes, uncomplicated: Secondary | ICD-10-CM | POA: Insufficient documentation

## 2016-09-11 DIAGNOSIS — N189 Chronic kidney disease, unspecified: Secondary | ICD-10-CM | POA: Diagnosis not present

## 2016-09-11 DIAGNOSIS — M545 Low back pain: Secondary | ICD-10-CM | POA: Insufficient documentation

## 2016-09-11 LAB — CBC WITH DIFFERENTIAL/PLATELET
BASOS ABS: 0.1 10*3/uL (ref 0.0–0.1)
BASOS PCT: 1 %
EOS ABS: 0.6 10*3/uL (ref 0.0–0.7)
EOS PCT: 6 %
HCT: 43.8 % (ref 36.0–46.0)
Hemoglobin: 15.1 g/dL — ABNORMAL HIGH (ref 12.0–15.0)
Lymphocytes Relative: 32 %
Lymphs Abs: 3.3 10*3/uL (ref 0.7–4.0)
MCH: 31.6 pg (ref 26.0–34.0)
MCHC: 34.5 g/dL (ref 30.0–36.0)
MCV: 91.6 fL (ref 78.0–100.0)
MONO ABS: 0.6 10*3/uL (ref 0.1–1.0)
MONOS PCT: 6 %
Neutro Abs: 5.8 10*3/uL (ref 1.7–7.7)
Neutrophils Relative %: 55 %
PLATELETS: 151 10*3/uL (ref 150–400)
RBC: 4.78 MIL/uL (ref 3.87–5.11)
RDW: 12.4 % (ref 11.5–15.5)
WBC: 10.3 10*3/uL (ref 4.0–10.5)

## 2016-09-11 LAB — BASIC METABOLIC PANEL
Anion gap: 8 (ref 5–15)
BUN: 10 mg/dL (ref 6–20)
CALCIUM: 9.4 mg/dL (ref 8.9–10.3)
CO2: 25 mmol/L (ref 22–32)
CREATININE: 0.55 mg/dL (ref 0.44–1.00)
Chloride: 104 mmol/L (ref 101–111)
GFR calc Af Amer: >60 mL/min (ref >60)
GLUCOSE: 193 mg/dL — AB (ref 65–99)
Potassium: 3.4 mmol/L — ABNORMAL LOW (ref 3.5–5.1)
Sodium: 137 mmol/L (ref 135–145)

## 2016-09-11 MED ORDER — ONDANSETRON HCL 4 MG/2ML IJ SOLN
4.0000 mg | Freq: Once | INTRAMUSCULAR | Status: AC
  Administered 2016-09-11: 4 mg via INTRAVENOUS
  Filled 2016-09-11: qty 2

## 2016-09-11 MED ORDER — FENTANYL CITRATE (PF) 100 MCG/2ML IJ SOLN
50.0000 ug | Freq: Once | INTRAMUSCULAR | Status: AC
  Administered 2016-09-11: 50 ug via INTRAVENOUS
  Filled 2016-09-11: qty 2

## 2016-09-11 MED ORDER — METHOCARBAMOL 500 MG PO TABS: 500.0000 mg | ORAL_TABLET | Freq: Three times a day (TID) | ORAL | 0 refills | Status: DC | PRN

## 2016-09-11 MED ORDER — MORPHINE SULFATE (PF) 4 MG/ML IV SOLN
4.0000 mg | Freq: Once | INTRAVENOUS | Status: AC
Start: 2016-09-11 — End: 2016-09-11
  Administered 2016-09-11: 4 mg via INTRAVENOUS
  Filled 2016-09-11: qty 1

## 2016-09-11 MED ORDER — CYCLOBENZAPRINE HCL 10 MG PO TABS: 10.0000 mg | ORAL_TABLET | Freq: Two times a day (BID) | ORAL | 0 refills | Status: DC | PRN

## 2016-09-11 MED ORDER — IOPAMIDOL (ISOVUE-300) INJECTION 61%
100.0000 mL | Freq: Once | INTRAVENOUS | Status: AC | PRN
  Administered 2016-09-11: 100 mL via INTRAVENOUS

## 2016-09-11 NOTE — Telephone Encounter (Signed)
Pt called and states that she is going to the ER due to her pain.

## 2016-09-11 NOTE — ED Triage Notes (Signed)
Pt having LLQ abdominal pain for over two weeks.  Seen at md office for same.  Sent for ultrasound to rule out ovarian cyst which was negative yesterday.  Pt continues to have pain which has progressed from intermittent to constant since yesterday.  Denies vaginal discharge or fever.

## 2016-09-11 NOTE — ED Provider Notes (Addendum)
[facility] DEPT [facility] Provider Note   CSN: [id number] Arrival date & time: 09/11/16  1016     History   Chief Complaint Chief Complaint  Patient presents with  . Abdominal Pain    HPI Donna Sullivan is a 50 y.o. female.  The history is provided by the patient.  Abdominal Pain   This is a new problem. The current episode started more than 1 week ago. Pertinent negatives include dysuria.  Patient's had pain in her left lower abdomen or groin for last 2 weeks. Worse with certain movements. Seen at primary care doctor yesterday and had negative urinalysis and had ultrasound for ovarian cyst. Ultrasound was negative. Pain is worse with movement. No diarrhea or constipation. No flank pain. States the pain feels limits in her hip or back. Worse with standing and walking. No vaginal bleeding or discharge. She's had previous hysterectomy.  Past Medical History:  Diagnosis Date  . Anxiety   . Asthma   . Chronic kidney disease 1985   BLOCKED TUBE IN KIDNEY  . Discoid lupus    Dr Ronnald Ramp, dermatologist  . GERD (gastroesophageal reflux disease)   . Headache(784.0)    MIGRAINES  . Insomnia   . Seizures ([place])    EPILEPSY- NO SEIZURES SINCE AGE 71  . Tendonitis     Patient Active Problem List   Diagnosis Date Noted  . Insomnia 09/06/2016  . Chronic bronchitis ([place]) 09/06/2016  . Tension headache 03/02/2016  . Right lumbar radiculopathy 03/02/2016  . Acute bacterial sinusitis 12/04/2014  . Pelvic mass in female 03/18/2014  . Hematuria, microscopic 03/05/2014  . Left flank pain 06/08/2013  . Routine general medical examination at a health care facility 04/10/2013  . Chest pain, atypical 04/10/2013  . LLQ pain 04/10/2013  . Trapezius muscle spasm 03/29/2013  . Hair loss 11/14/2012  . Depression 11/14/2012  . Pelvic pain in female 09/23/2012  . Fecal incontinence 05/13/2012  . Seasonal allergic rhinitis 05/13/2012  . Panic anxiety syndrome 05/13/2012  . Stress incontinence,  female 11/06/2011  . ROM 05/22/2010  . ANKLE PAIN, LEFT 03/09/2009  . LUPUS ERYTHEMATOSUS, DISCOID 01/26/2009    Past Surgical History:  Procedure Laterality Date  . ABDOMINAL HYSTERECTOMY    . ANKLE RECONSTRUCTION  04/2009  . APPENDECTOMY    . CESAREAN SECTION    . CHOLECYSTECTOMY    . DIAGNOSTIC LAPAROSCOPY    . KIDNEY SURGERY     right, straightened tube  . LAPAROSCOPY  09/23/2012   Procedure: LAPAROSCOPY OPERATIVE;  Surgeon: Cheri Fowler, MD;  Location: [facility];  Service: Gynecology;  Laterality: N/A;  . OTHER SURGICAL HISTORY     Hysterectomy with 10cm grown removal, Dr. Willis Modena  . RIGHT OOPHORECTOMY      OB History    Gravida Para Term Preterm AB Living   3 1     2 1    SAB TAB Ectopic Multiple Live Births                   Home Medications    Prior to Admission medications   Medication Sig Start Date End Date Taking? Authorizing Provider  albuterol (VENTOLIN HFA) 108 (90 Base) MCG/ACT inhaler INHALE 1-2 PUFFS INTO THE LUNGS EVERY 6 HOURS AS NEEDED FOR WHEEZING OR SHORTNESS OF BREATH 09/06/16   Midge Minium, MD  cetirizine (ZYRTEC) 10 MG tablet Take 1 tablet (10 mg total) by mouth daily. 09/06/16   Midge Minium, MD  cyclobenzaprine (FLEXERIL) 10 MG tablet Take 1  tablet (10 mg total) by mouth 2 (two) times daily as needed for muscle spasms. 09/11/16   Davonna Belling, MD  HYDROcodone-acetaminophen (NORCO/VICODIN) 5-325 MG tablet Take 1 tablet by mouth every 6 (six) hours as needed for moderate pain. Patient not taking: Reported on 09/06/2016 04/26/16   Mosie Lukes, MD  ibuprofen (ADVIL,MOTRIN) 800 MG tablet TAKE 1 TABLET BY MOUTH EVERY 8 HOURS AS NEEDED FOR MILD PAIN 08/30/16   Midge Minium, MD  LORazepam (ATIVAN) 0.5 MG tablet Take 1 tablet (0.5 mg total) by mouth 2 (two) times daily as needed. for anxiety 08/23/16   Midge Minium, MD  QVAR 40 MCG/ACT inhaler Inhale 2 puffs into the lungs 2 (two) times daily. 09/06/16   Midge Minium, MD    Suvorexant (BELSOMRA) 10 MG TABS Take 10 mg by mouth at bedtime. 09/06/16   Midge Minium, MD    Family History Family History  Problem Relation Age of Onset  . Coronary artery disease Mother   . Hypertension Mother   . Hypertension Father   . Colon cancer      grandfather    Social History Social History  Substance Use Topics  . Smoking status: Current Every Day Smoker    Packs/day: 0.50    Types: Cigarettes  . Smokeless tobacco: Never Used     Comment: Maybe later  . Alcohol use Yes     Comment: occ     Allergies   Symbicort [budesonide-formoterol fumarate]; Tramadol; Amoxicillin-pot clavulanate; Bactrim [sulfamethoxazole-trimethoprim]; Ciprofloxacin hcl; Clindamycin/lincomycin; Doxycycline; Keflex [cephalexin]; and Wellbutrin [bupropion]   Review of Systems Review of Systems  Constitutional: Negative for appetite change.  HENT: Negative for congestion.   Eyes: Negative for photophobia.  Respiratory: Negative for chest tightness.   Cardiovascular: Negative for chest pain.  Gastrointestinal: Positive for abdominal pain.  Genitourinary: Negative for dysuria and flank pain.  Musculoskeletal: Positive for back pain.  Skin: Negative for rash.  Neurological: Negative for speech difficulty.  Hematological: Negative for adenopathy.     Physical Exam Updated Vital Signs BP 132/84 (BP Location: Right Arm)   Pulse 62   Temp 97.9 F (36.6 C) (Oral)   Resp 18   Ht 5\' 2"  (1.575 m)   Wt 197 lb (89.4 kg)   SpO2 97%   BMI 36.03 kg/m   Physical Exam  Constitutional: She appears well-developed.  HENT:  Head: Atraumatic.  Eyes: EOM are normal.  Neck: Neck supple.  Cardiovascular: Normal rate.   Pulmonary/Chest: Effort normal.  Abdominal: There is tenderness.  Moderate tenderness to left inguinal area. No mass. Some left lower quadrant tenderness. Pain with straight leg raise on left. Some tenderness in left lower back area. No hernias palpated.   Musculoskeletal:  Pulse intact in left foot.  Neurological: She is alert.  Skin: Skin is warm.     ED Treatments / Results  Labs (all labs ordered are listed, but only abnormal results are displayed) Labs Reviewed  BASIC METABOLIC PANEL - Abnormal; Notable for the following:       Result Value   Potassium 3.4 (*)    Glucose, Bld 193 (*)    All other components within normal limits  CBC WITH DIFFERENTIAL/PLATELET - Abnormal; Notable for the following:    Hemoglobin 15.1 (*)    All other components within normal limits    EKG  EKG Interpretation None       Radiology US Transvaginal Non-ob  Result Date: 09/10/2016 CLINICAL DATA:  Left lower quadrant  pain for 2-3 weeks. EXAM: TRANSABDOMINAL AND TRANSVAGINAL ULTRASOUND OF PELVIS TECHNIQUE: Both transabdominal and transvaginal ultrasound examinations of the pelvis were performed. Transabdominal technique was performed for global imaging of the pelvis including uterus, ovaries, adnexal regions, and pelvic cul-de-sac. It was necessary to proceed with endovaginal exam following the transabdominal exam to visualize the left ovary. COMPARISON:  CT 03/08/2014 FINDINGS: Uterus Measurements: Prior hysterectomy. Endometrium Thickness: N/A. Right ovary Measurements: Prior oophorectomy. Left ovary Measurements: 2.1 x 2.1 x 1.7 cm. Normal appearance/no adnexal mass. Other findings Trace free fluid in the pelvis IMPRESSION: Unremarkable left ovary. Prior hysterectomy and right oophorectomy. No acute findings. Electronically Signed   By: Rolm Baptise M.D.   On: 09/10/2016 15:29   US Pelvis Complete  Result Date: 09/10/2016 CLINICAL DATA:  Left lower quadrant pain for 2-3 weeks. EXAM: TRANSABDOMINAL AND TRANSVAGINAL ULTRASOUND OF PELVIS TECHNIQUE: Both transabdominal and transvaginal ultrasound examinations of the pelvis were performed. Transabdominal technique was performed for global imaging of the pelvis including uterus, ovaries, adnexal regions,  and pelvic cul-de-sac. It was necessary to proceed with endovaginal exam following the transabdominal exam to visualize the left ovary. COMPARISON:  CT 03/08/2014 FINDINGS: Uterus Measurements: Prior hysterectomy. Endometrium Thickness: N/A. Right ovary Measurements: Prior oophorectomy. Left ovary Measurements: 2.1 x 2.1 x 1.7 cm. Normal appearance/no adnexal mass. Other findings Trace free fluid in the pelvis IMPRESSION: Unremarkable left ovary. Prior hysterectomy and right oophorectomy. No acute findings. Electronically Signed   By: Rolm Baptise M.D.   On: 09/10/2016 15:29   Ct Abdomen Pelvis W Contrast  Result Date: 09/11/2016 CLINICAL DATA:  LEFT lower quadrant pain. LEFT groin pain for 2 weeks. History of RIGHT oophorectomy and hysterectomy as well as appendectomy and cholecystectomy. EXAM: CT ABDOMEN AND PELVIS WITH CONTRAST TECHNIQUE: Multidetector CT imaging of the abdomen and pelvis was performed using the standard protocol following bolus administration of intravenous contrast. CONTRAST:  131mL ISOVUE-300 IOPAMIDOL (ISOVUE-300) INJECTION 61% COMPARISON:  CT 03/08/2014 FINDINGS: Lower chest: Lung bases are clear. Hepatobiliary: Liver has a fine nodular contour. The LEFT hepatic lobe is prominent. Small enhancing subcapsular lesion the RIGHT hepatic lobe measures 6 mm (image 19, series 2). Postcholecystectomy. Portal vein is patent. No intra abdominal free fluid. Pancreas: Pancreas is normal. No ductal dilatation. No pancreatic inflammation. Spleen: Normal spleen Adrenals/urinary tract: Adrenal glands and kidneys are normal. The ureters and bladder normal. Stomach/Bowel: Stomach, small bowel, appendix, and cecum are normal. The colon and rectosigmoid colon are normal. There are diverticula sigmoid colon without acute inflammation. Vascular/Lymphatic: Abdominal aorta is normal caliber. Prominent periportal lymph nodes. No pelvic lymphadenopathy. Reproductive: Post hysterectomy. Other: No free fluid.  Musculoskeletal: No aggressive osseous lesion. IMPRESSION: 1. Diverticulosis without evidence of diverticulitis. 2. Nodular contour of liver suggests early morphologic changes of cirrhosis. 3. Very small enhancing lesion within the RIGHT hepatic lobe is favored a benign vascular lesions; however with findings suggestive of cirrhosis, follow-up MRI may be warranted (with IV contrast). Electronically Signed   By: Suzy Bouchard M.D.   On: 09/11/2016 13:33   Mm Digital Screening Bilateral  Result Date: 09/11/2016 CLINICAL DATA:  Screening. EXAM: DIGITAL SCREENING BILATERAL MAMMOGRAM WITH CAD COMPARISON:  Previous exam(s). ACR Breast Density Category b: There are scattered areas of fibroglandular density. FINDINGS: There are no findings suspicious for malignancy. Images were processed with CAD. IMPRESSION: No mammographic evidence of malignancy. A result letter of this screening mammogram will be mailed directly to the patient. RECOMMENDATION: Screening mammogram in one year. (Code:SM-B-01Y) BI-RADS CATEGORY  1:  Negative. Electronically Signed   By: Franki Cabot M.D.   On: 09/11/2016 13:04    Procedures Procedures (including critical care time)  Medications Ordered in ED Medications  fentaNYL (SUBLIMAZE) injection 50 mcg (50 mcg Intravenous Given 09/11/16 1215)  ondansetron (ZOFRAN) injection 4 mg (4 mg Intravenous Given 09/11/16 1213)  iopamidol (ISOVUE-300) 61 % injection 100 mL (100 mLs Intravenous Contrast Given 09/11/16 1249)  morphine 4 MG/ML injection 4 mg (4 mg Intravenous Given 09/11/16 1418)     Initial Impression / Assessment and Plan / ED Course  I have reviewed the triage vital signs and the nursing notes.  Pertinent labs & imaging results that were available during my care of the patient were reviewed by me and considered in my medical decision making (see chart for details).  Clinical Course    Patient presents with abdominal pain. Also somewhat from left groin may be into the  back. Ultrasound done yesterday reassuring. Urine done yesterday reassuring. Labs reassuring. CT scan done due to tenderness and was also reassuring. Patient was made aware of the possible liver findings. TE pain. Likely muscle skeletal. Will discharge home follow-up with her primary care doctor.  Final Clinical Impressions(s) / ED Diagnoses   Final diagnoses:  Left lower quadrant pain  Musculoskeletal leg pain, left    New Prescriptions New Prescriptions   CYCLOBENZAPRINE (FLEXERIL) 10 MG TABLET    Take 1 tablet (10 mg total) by mouth 2 (two) times daily as needed for muscle spasms.     Davonna Belling, MD 09/11/16 1423  Patient told the nurse she cannot take muscle relaxers because they make her stiff the next day. Was informed preoperatively narcotics for this. She eventually settled on Robaxin.   Davonna Belling, MD 09/11/16 1431

## 2016-09-13 ENCOUNTER — Other Ambulatory Visit: Payer: Self-pay | Admitting: Family Medicine

## 2016-09-13 NOTE — Telephone Encounter (Signed)
Last OV 09/06/16 Ibuprofen last filled 08/30/16 #30 with 0

## 2016-09-13 NOTE — Telephone Encounter (Signed)
Medication filled to pharmacy as requested.   

## 2016-09-20 ENCOUNTER — Other Ambulatory Visit: Payer: Self-pay | Admitting: General Practice

## 2016-09-20 MED ORDER — LORAZEPAM 0.5 MG PO TABS: 0.5000 mg | ORAL_TABLET | Freq: Two times a day (BID) | ORAL | 0 refills | Status: DC | PRN

## 2016-09-20 NOTE — Telephone Encounter (Signed)
Last OV 09/06/16 Lorazepam last filled 08/23/16 #30 with 0

## 2016-09-21 NOTE — Telephone Encounter (Signed)
Medication filled to pharmacy as requested.   

## 2016-09-28 ENCOUNTER — Other Ambulatory Visit: Payer: Self-pay | Admitting: Family Medicine

## 2016-10-02 ENCOUNTER — Telehealth: Payer: Self-pay | Admitting: Family Medicine

## 2016-10-02 NOTE — Telephone Encounter (Signed)
Pt calling asking for a call back from KT regarding several issues. Pt asking why KT is only giving her 30 pills of ibuprofen, pt states that she has to take 3 at a time to help with pain and is needing more. Pt is having jerks with the sleep meds. Pt states that she is having headaches and still not able to sleep. Pt has questions about a hearing for her disability that is coming up, she states that she understands that KT is not a disability Dr however would like to speak with her.

## 2016-10-02 NOTE — Telephone Encounter (Signed)
Please schedule patient a 30 minute appointment to discuss with Tabori.

## 2016-10-03 NOTE — Telephone Encounter (Signed)
She CANNOT take 3 ibuprofen at a time- these are 800mg  tablets!!!  The max is 3 tabs daily!  She is going to destroy her kidneys and cause a GI bleed taking this much medication!  I do not have anything to do with disability hearings and therefor do not feel comfortable answering questions  And yes, with all of these questions she needs to have an appt b/c these are not things to answer via phone or email.  Donna Sullivan was conveying the right information and yelling at staff will not be tolerated

## 2016-10-03 NOTE — Telephone Encounter (Signed)
I called patient and spoke with her at length about the need for her to see Dr Birdie Riddle to discuss her medications and current pain levels. Pt was very apologetic about her behavior earlier in the day and agreed to schedule a visit for this Friday. Pt also confirmed that she was taking three ibu a day but not at the same time. I agreed to bill her for her office visit copay as well on Friday.

## 2016-10-03 NOTE — Telephone Encounter (Signed)
Pt states that she wants a call back from her doctor. Pt states (yelling) she doesn't have the money to pay for an office visit and all she needs is some questions answered. Pt states that she has been out of work for 3 years and can;t afford the $30.00 copay.  When I tried to explain that Dr. Birdie Riddle does not have the time to make phone calls to pt due to her schedule and that an appt is needed for the issues pt is having she cut me off saying that she is my doctor and should be able to talk to me. I told her I would pass this message along.

## 2016-10-05 ENCOUNTER — Ambulatory Visit (INDEPENDENT_AMBULATORY_CARE_PROVIDER_SITE_OTHER): Payer: 59 | Admitting: Family Medicine

## 2016-10-05 ENCOUNTER — Encounter: Payer: Self-pay | Admitting: Family Medicine

## 2016-10-05 VITALS — BP 124/80 | HR 83 | Temp 98.2°F | Resp 16 | Ht 64.0 in | Wt 202.0 lb

## 2016-10-05 DIAGNOSIS — M5416 Radiculopathy, lumbar region: Secondary | ICD-10-CM | POA: Diagnosis not present

## 2016-10-05 MED ORDER — METHOCARBAMOL 500 MG PO TABS: 500.0000 mg | ORAL_TABLET | Freq: Three times a day (TID) | ORAL | 0 refills | Status: DC | PRN

## 2016-10-05 MED ORDER — DULOXETINE HCL 30 MG PO CPEP: 30.0000 mg | ORAL_CAPSULE | Freq: Every day | ORAL | 1 refills | Status: DC

## 2016-10-05 NOTE — Patient Instructions (Signed)
Follow up in 4-6 weeks to recheck mood and pain Start the Cymbalta daily for pain Use the Robaxin at night for pain and spasm relief Ibuprofen as needed- you can add tylenol for breakthrough pain Drink plenty of fluids as dehydration worsens headaches Call with any questions or concerns Hang in there!!!

## 2016-10-05 NOTE — Progress Notes (Signed)
Pre visit review using our clinic review tool, if applicable. No additional management support is needed unless otherwise documented below in the visit note. 
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
## 2016-10-05 NOTE — Assessment & Plan Note (Signed)
Deteriorated.  Pt reports pain is worsening.  She is not interested in pain referral, lyrica or gabapentin.  She initially refused Cymbalta but then accepted this as an option.  Confronted her w/ the results of her UDS from last month that show + THC and oxycodone- despite her not having a prescription for Oxycodone in the [place] controlled substance database.  She had nothing to say about the [facility]- but did acknowledge that we had a previous conversation about this.  In regards to the Oxy, pt states her mom gave her a pill to hold onto for severe pain and she can't be sure when she took this.  This is not the first time she has had opiates in her system when they were not prescribed for her.  Continue Ibuprofen, Robaxin.  Add Cymbalta.  Will follow.

## 2016-10-08 ENCOUNTER — Telehealth: Payer: Self-pay | Admitting: Family Medicine

## 2016-10-08 NOTE — Telephone Encounter (Signed)
Could call the pharmacy to find out is this needs a PA?

## 2016-10-08 NOTE — Telephone Encounter (Signed)
Cymbalta is generic so I'm not sure why her plan charges $30.  She can ask the pharmacy if this was run correctly

## 2016-10-08 NOTE — Telephone Encounter (Signed)
Contacted the [employer] and her insurance does cover the Cymbalta. They quoted $42.45 with running thru insurance. Pharmacy stated she didn't need a PA for coverage.  I called patient left a message asking if she had a high deductible coverage or close to the donut hole.

## 2016-10-08 NOTE — Telephone Encounter (Signed)
Pt states that the cymbalta cost her $30.00 and that's too much. Pt asking if we have a coupon card for this or is there anything cheaper. [place].

## 2016-10-17 ENCOUNTER — Encounter: Payer: Self-pay | Admitting: General Practice

## 2016-10-17 ENCOUNTER — Telehealth: Payer: Self-pay | Admitting: Family Medicine

## 2016-10-17 NOTE — Telephone Encounter (Signed)
Pt states that she is having nausea,diarrhea,headaches, and not able to eat. Pt states she thinks this may be side effects to the cymbalta and asking if this will go away or is something else going on.

## 2016-10-17 NOTE — Telephone Encounter (Signed)
Pt states that her symptoms just started this morning.   Pt was advised that this is not due to the medicine but more likely a GI bug/virus.. Pt was advised to take OTC immodium and to symptom treat. If symptoms are still persistent after 3-4 days or have worsened then she will need a follow up appt. Pt agreeable with advice.

## 2016-10-17 NOTE — Telephone Encounter (Signed)
Typically, these symptoms will resolve within the first 7-10 days of a medication.  If it has been longer than that, we can stop the medication or switch to another

## 2016-10-19 ENCOUNTER — Telehealth: Payer: Self-pay | Admitting: General Practice

## 2016-10-19 NOTE — Telephone Encounter (Signed)
Pt had Oxy and Marijuana on UDS and I will not be prescribing controlled substances as this is the 2nd violation of our controlled substance agreement

## 2016-10-19 NOTE — Telephone Encounter (Signed)
Last OV 10/05/16 Lorazepam last filled 09/20/16 #30 with 0

## 2016-10-19 NOTE — Telephone Encounter (Signed)
Patient dismissed from [facility] by Annye Asa MD , effective October 17, 2016 Dismissal letter sent out by certified / registered mail. DAJ

## 2016-10-19 NOTE — Telephone Encounter (Signed)
Noted, med denied.

## 2016-10-26 NOTE — Telephone Encounter (Signed)
Called pt and explained dismissal. She stated she has not gotten a letter yet. I advised that Dr Birdie Riddle, as well as other [facility] physicians, will no longer treat her or be refilling lorazepam for her. Ms Lemmon says that she was unaware that lorazepam was a controled substance and she was also unaware that she was on a narcotic agreement.  Pt feels that Buspar worked well for her in the past and would like a 1x rx called in for that until she can obtain another provider. States she has some lorazepam left but she thought that she had to go ahead and call it in for insurance purposes. She says that if Dr Birdie Riddle will reconsider her dismissal she will remain on the Buspar and take a drug test every month.  I explained that it was up to the MD to decide if she was willing/comfortable calling in a medication and that it was unlikely but that I would call her back with the decision.

## 2016-10-26 NOTE — Telephone Encounter (Signed)
Pt has not had Buspar since 2013.  I am not comfortable prescribing a medication that she has not taken in 4 years when I am no longer her physician.  I will leave the treatment of her anxiety up to her new physician

## 2016-11-01 NOTE — Telephone Encounter (Signed)
I have tried calling patient since 10/29/16. Unable to reach patient.

## 2016-11-16 NOTE — Telephone Encounter (Signed)
Certified dismissal letter returned as undeliverable, unclaimed, return to sender after three attempts by [employer] on November 16, 2016 Letter placed in another envelope and resent as 1st class mail which does not require a signature. DAJ

## 2017-01-17 ENCOUNTER — Encounter (HOSPITAL_COMMUNITY): Payer: Self-pay | Admitting: Emergency Medicine

## 2017-01-17 ENCOUNTER — Emergency Department (HOSPITAL_COMMUNITY)
Admission: EM | Admit: 2017-01-17 | Discharge: 2017-01-17 | Disposition: A | Discharge status: Home / Self Care | Payer: 59 | Attending: Emergency Medicine | Admitting: Emergency Medicine

## 2017-01-17 DIAGNOSIS — N189 Chronic kidney disease, unspecified: Secondary | ICD-10-CM | POA: Diagnosis not present

## 2017-01-17 DIAGNOSIS — R05 Cough: Secondary | ICD-10-CM | POA: Diagnosis present

## 2017-01-17 DIAGNOSIS — J40 Bronchitis, not specified as acute or chronic: Secondary | ICD-10-CM | POA: Diagnosis not present

## 2017-01-17 DIAGNOSIS — F17210 Nicotine dependence, cigarettes, uncomplicated: Secondary | ICD-10-CM | POA: Diagnosis not present

## 2017-01-17 DIAGNOSIS — Z79899 Other long term (current) drug therapy: Secondary | ICD-10-CM | POA: Insufficient documentation

## 2017-01-17 MED ORDER — BENZONATATE 100 MG PO CAPS: 100.0000 mg | ORAL_CAPSULE | Freq: Three times a day (TID) | ORAL | 0 refills | Status: DC | PRN

## 2017-01-17 MED ORDER — ALBUTEROL SULFATE (2.5 MG/3ML) 0.083% IN NEBU: 2.5000 mg | INHALATION_SOLUTION | Freq: Four times a day (QID) | RESPIRATORY_TRACT | 12 refills | Status: DC | PRN

## 2017-01-17 MED ORDER — IBUPROFEN 800 MG PO TABS
800.0000 mg | ORAL_TABLET | Freq: Once | ORAL | Status: AC
  Administered 2017-01-17: 800 mg via ORAL
  Filled 2017-01-17: qty 1

## 2017-01-17 MED ORDER — PREDNISONE 10 MG PO TABS: 20.0000 mg | ORAL_TABLET | Freq: Every day | ORAL | 0 refills | Status: DC

## 2017-01-17 MED ORDER — ALBUTEROL SULFATE (2.5 MG/3ML) 0.083% IN NEBU: 2.5000 mg | INHALATION_SOLUTION | Freq: Four times a day (QID) | RESPIRATORY_TRACT | 12 refills | Status: AC | PRN

## 2017-01-17 MED ORDER — IPRATROPIUM BROMIDE 0.02 % IN SOLN
0.5000 mg | Freq: Once | RESPIRATORY_TRACT | Status: AC
  Administered 2017-01-17: 0.5 mg via RESPIRATORY_TRACT
  Filled 2017-01-17: qty 2.5

## 2017-01-17 MED ORDER — ALBUTEROL SULFATE (2.5 MG/3ML) 0.083% IN NEBU
5.0000 mg | INHALATION_SOLUTION | Freq: Once | RESPIRATORY_TRACT | Status: AC
  Administered 2017-01-17: 5 mg via RESPIRATORY_TRACT
  Filled 2017-01-17: qty 6

## 2017-01-17 MED ORDER — QVAR 40 MCG/ACT IN AERS: 2.0000 | INHALATION_SPRAY | Freq: Two times a day (BID) | RESPIRATORY_TRACT | 6 refills | Status: DC

## 2017-01-17 MED ORDER — AZITHROMYCIN 250 MG PO TABS
500.0000 mg | ORAL_TABLET | Freq: Once | ORAL | Status: AC
  Administered 2017-01-17: 500 mg via ORAL
  Filled 2017-01-17: qty 2

## 2017-01-17 MED ORDER — PREDNISONE 20 MG PO TABS
60.0000 mg | ORAL_TABLET | Freq: Once | ORAL | Status: AC
  Administered 2017-01-17: 60 mg via ORAL
  Filled 2017-01-17: qty 3

## 2017-01-17 MED ORDER — AZITHROMYCIN 250 MG PO TABS: 250.0000 mg | ORAL_TABLET | Freq: Every day | ORAL | 0 refills | Status: DC

## 2017-01-17 NOTE — ED Triage Notes (Addendum)
Pt reports URI symptoms for the past 12 days with cough, generalized body aches, intermittent nosebleeds, and sore throat. Has been using family member's inhaler for COPD without relief.

## 2017-01-17 NOTE — ED Provider Notes (Signed)
[facility] DEPT Provider Note   CSN: [id number] Arrival date & time: 01/17/17  1208     History   Chief Complaint Chief Complaint  Patient presents with  . URI    HPI Donna Sullivan is a 51 y.o. female.  HPI   The patient  is a past medical history of smoking, anxiety, asthma, chronic kidney disease, lupus, acid reflux, migraine comes to the ER with symptoms of cough, nasal congestion, ear fullness, body aches, wheezing, sore throat for the past 12 days. She is out of her Qvar at home and therefore has been taking her mother's albuterol inhaler. She has tried over-the-counter cough and cold medicine but is not helping and she feels that she's getting worse. She denies having fevers at home. She denies having any nausea, vomiting, diarrhea, headaches, weakness, confusion, back pain, lower extremity swelling, orthopnea, dysphagia.  She has had a couple episodes of epistaxis, nasal drainage, productive cough  Past Medical History:  Diagnosis Date  . Anxiety   . Asthma   . Chronic kidney disease 1985   BLOCKED TUBE IN KIDNEY  . Discoid lupus    Dr Ronnald Ramp, dermatologist  . GERD (gastroesophageal reflux disease)   . Headache(784.0)    MIGRAINES  . Insomnia   . Seizures ([place])    EPILEPSY- NO SEIZURES SINCE AGE 55  . Tendonitis     Patient Active Problem List   Diagnosis Date Noted  . Insomnia 09/06/2016  . Chronic bronchitis ([place]) 09/06/2016  . Tension headache 03/02/2016  . Right lumbar radiculopathy 03/02/2016  . Acute bacterial sinusitis 12/04/2014  . Pelvic mass in female 03/18/2014  . Hematuria, microscopic 03/05/2014  . Left flank pain 06/08/2013  . Routine general medical examination at a health care facility 04/10/2013  . Chest pain, atypical 04/10/2013  . LLQ pain 04/10/2013  . Trapezius muscle spasm 03/29/2013  . Hair loss 11/14/2012  . Depression 11/14/2012  . Pelvic pain in female 09/23/2012  . Fecal incontinence 05/13/2012  . Seasonal allergic rhinitis  05/13/2012  . Panic anxiety syndrome 05/13/2012  . Stress incontinence, female 11/06/2011  . ROM 05/22/2010  . ANKLE PAIN, LEFT 03/09/2009  . LUPUS ERYTHEMATOSUS, DISCOID 01/26/2009    Past Surgical History:  Procedure Laterality Date  . ABDOMINAL HYSTERECTOMY    . ANKLE RECONSTRUCTION  04/2009  . APPENDECTOMY    . CESAREAN SECTION    . CHOLECYSTECTOMY    . DIAGNOSTIC LAPAROSCOPY    . KIDNEY SURGERY     right, straightened tube  . LAPAROSCOPY  09/23/2012   Procedure: LAPAROSCOPY OPERATIVE;  Surgeon: Cheri Fowler, MD;  Location: [facility];  Service: Gynecology;  Laterality: N/A;  . OTHER SURGICAL HISTORY     Hysterectomy with 10cm grown removal, Dr. Willis Modena  . RIGHT OOPHORECTOMY      OB History    Gravida Para Term Preterm AB Living   3 1     2 1    SAB TAB Ectopic Multiple Live Births                   Home Medications    Prior to Admission medications   Medication Sig Start Date End Date Taking? Authorizing Provider  albuterol (PROVENTIL) (2.5 MG/3ML) 0.083% nebulizer solution Take 3 mLs (2.5 mg total) by nebulization every 6 (six) hours as needed for wheezing or shortness of breath. 01/17/17   Earnstine Meinders Carlota Raspberry, PA-C  albuterol (VENTOLIN HFA) 108 (90 Base) MCG/ACT inhaler INHALE 1-2 PUFFS INTO THE LUNGS EVERY 6 HOURS AS  NEEDED FOR WHEEZING OR SHORTNESS OF BREATH 09/06/16   Midge Minium, MD  azithromycin (ZITHROMAX) 250 MG tablet Take 1 tablet (250 mg total) by mouth daily. Take first 2 tablets together, then 1 every day until finished. 01/17/17   Wyat Infinger Carlota Raspberry, PA-C  benzonatate (TESSALON PERLES) 100 MG capsule Take 1 capsule (100 mg total) by mouth 3 (three) times daily as needed for cough. 01/17/17   Aliegha Paullin Carlota Raspberry, PA-C  cetirizine (ZYRTEC) 10 MG tablet Take 1 tablet (10 mg total) by mouth daily. 09/06/16   Midge Minium, MD  DULoxetine (CYMBALTA) 30 MG capsule Take 1 capsule (30 mg total) by mouth daily. 10/05/16   Midge Minium, MD  ibuprofen (ADVIL,MOTRIN) 800  MG tablet TAKE 1 TABLET BY MOUTH EVERY 8 HOURS AS NEEDED FOR MILD PAIN 10/01/16   Midge Minium, MD  LORazepam (ATIVAN) 0.5 MG tablet Take 1 tablet (0.5 mg total) by mouth 2 (two) times daily as needed. for anxiety 09/20/16   Midge Minium, MD  methocarbamol (ROBAXIN) 500 MG tablet Take 1 tablet (500 mg total) by mouth every 8 (eight) hours as needed for muscle spasms. 10/05/16   Midge Minium, MD  predniSONE (DELTASONE) 10 MG tablet Take 2 tablets (20 mg total) by mouth daily. 01/17/17   Aeva Posey Carlota Raspberry, PA-C  QVAR 40 MCG/ACT inhaler Inhale 2 puffs into the lungs 2 (two) times daily. 01/17/17   Delos Haring, PA-C    Family History Family History  Problem Relation Age of Onset  . Coronary artery disease Mother   . Hypertension Mother   . Hypertension Father   . Colon cancer      grandfather    Social History Social History  Substance Use Topics  . Smoking status: Current Every Day Smoker    Packs/day: 0.50    Types: Cigarettes  . Smokeless tobacco: Never Used     Comment: Maybe later  . Alcohol use Yes     Comment: occ     Allergies   Symbicort [budesonide-formoterol fumarate]; Tramadol; Amoxicillin-pot clavulanate; Bactrim [sulfamethoxazole-trimethoprim]; Ciprofloxacin hcl; Clindamycin/lincomycin; Doxycycline; Keflex [cephalexin]; and Wellbutrin [bupropion]   Review of Systems Review of Systems  Review of Systems All other systems negative except as documented in the HPI. All pertinent positives and negatives as reviewed in the HPI.  Physical Exam Updated Vital Signs BP 113/71   Pulse 78   Temp 99.7 F (37.6 C) (Oral)   Resp 22   Ht 5\' 1"  (1.549 m)   Wt 90.7 kg   SpO2 97%   BMI 37.79 kg/m   Physical Exam  Constitutional: She appears well-developed and well-nourished. No distress.  HENT:  Head: Normocephalic and atraumatic.  Right Ear: Tympanic membrane and ear canal normal.  Left Ear: Tympanic membrane and ear canal normal.  Nose: Nose normal.    Mouth/Throat: Uvula is midline, oropharynx is clear and moist and mucous membranes are normal.  Eyes: Pupils are equal, round, and reactive to light.  Neck: Normal range of motion. Neck supple.  Cardiovascular: Normal rate and regular rhythm.   Pulmonary/Chest: Effort normal. She has no decreased breath sounds. She has wheezes. She has no rhonchi. She has no rales.  Productive cough during exam  Abdominal: Soft.  No signs of abdominal distention  Musculoskeletal:  No LE swelling  Neurological: She is alert.  Acting at baseline  Skin: Skin is warm and dry. No rash noted.  Nursing note and vitals reviewed.    ED Treatments / Results  Labs (all labs ordered are listed, but only abnormal results are displayed) Labs Reviewed - No data to display  EKG  EKG Interpretation None       Radiology No results found.  Procedures Procedures (including critical care time)  Medications Ordered in ED Medications  predniSONE (DELTASONE) tablet 60 mg (not administered)  azithromycin (ZITHROMAX) tablet 500 mg (not administered)  albuterol (PROVENTIL) (2.5 MG/3ML) 0.083% nebulizer solution 5 mg (not administered)  ipratropium (ATROVENT) nebulizer solution 0.5 mg (not administered)     Initial Impression / Assessment and Plan / ED Course  I have reviewed the triage vital signs and the nursing notes.  Pertinent labs & imaging results that were available during my care of the patient were reviewed by me and considered in my medical decision making (see chart for details).     Pt is a chronic smoker, out of her albuterol nebulizer and Qvar which I will refill today. After 12 days of symptoms will treat with prednisone dosepack and Z-pack. Discussed smoking cessation. Discussed return precautions. Pt is overall well appearing, given first dose of medications and breathing treatments in the ED.  Blood pressure 113/71, pulse 78, temperature 99.7 F (37.6 C), temperature source Oral, resp.  rate 22, height 5\' 1"  (1.549 m), weight 90.7 kg, SpO2 97 %.   Final Clinical Impressions(s) / ED Diagnoses   Final diagnoses:  Bronchitis    New Prescriptions New Prescriptions   ALBUTEROL (PROVENTIL) (2.5 MG/3ML) 0.083% NEBULIZER SOLUTION    Take 3 mLs (2.5 mg total) by nebulization every 6 (six) hours as needed for wheezing or shortness of breath.   AZITHROMYCIN (ZITHROMAX) 250 MG TABLET    Take 1 tablet (250 mg total) by mouth daily. Take first 2 tablets together, then 1 every day until finished.   BENZONATATE (TESSALON PERLES) 100 MG CAPSULE    Take 1 capsule (100 mg total) by mouth 3 (three) times daily as needed for cough.   PREDNISONE (DELTASONE) 10 MG TABLET    Take 2 tablets (20 mg total) by mouth daily.     Delos Haring, PA-C 01/17/17 [place], MD 01/21/17 [phone]

## 2017-05-15 ENCOUNTER — Encounter: Payer: Self-pay | Admitting: Gynecology

## 2017-05-21 ENCOUNTER — Inpatient Hospital Stay (HOSPITAL_COMMUNITY): Payer: 59

## 2017-05-21 ENCOUNTER — Encounter (HOSPITAL_COMMUNITY): Payer: Self-pay | Admitting: Emergency Medicine

## 2017-05-21 ENCOUNTER — Observation Stay (HOSPITAL_COMMUNITY)
Admission: EM | Admit: 2017-05-21 | Discharge: 2017-05-23 | DRG: 378 | Disposition: A | Discharge status: Home / Self Care | Payer: 59 | Attending: Internal Medicine | Admitting: Internal Medicine

## 2017-05-21 DIAGNOSIS — Z79899 Other long term (current) drug therapy: Secondary | ICD-10-CM | POA: Diagnosis not present

## 2017-05-21 DIAGNOSIS — Z8249 Family history of ischemic heart disease and other diseases of the circulatory system: Secondary | ICD-10-CM | POA: Insufficient documentation

## 2017-05-21 DIAGNOSIS — Z7952 Long term (current) use of systemic steroids: Secondary | ICD-10-CM | POA: Insufficient documentation

## 2017-05-21 DIAGNOSIS — N189 Chronic kidney disease, unspecified: Secondary | ICD-10-CM | POA: Diagnosis not present

## 2017-05-21 DIAGNOSIS — L930 Discoid lupus erythematosus: Secondary | ICD-10-CM | POA: Diagnosis present

## 2017-05-21 DIAGNOSIS — F329 Major depressive disorder, single episode, unspecified: Secondary | ICD-10-CM

## 2017-05-21 DIAGNOSIS — D123 Benign neoplasm of transverse colon: Secondary | ICD-10-CM | POA: Diagnosis not present

## 2017-05-21 DIAGNOSIS — Z881 Allergy status to other antibiotic agents status: Secondary | ICD-10-CM

## 2017-05-21 DIAGNOSIS — K635 Polyp of colon: Secondary | ICD-10-CM | POA: Diagnosis not present

## 2017-05-21 DIAGNOSIS — Z9109 Other allergy status, other than to drugs and biological substances: Secondary | ICD-10-CM | POA: Diagnosis not present

## 2017-05-21 DIAGNOSIS — D125 Benign neoplasm of sigmoid colon: Secondary | ICD-10-CM

## 2017-05-21 DIAGNOSIS — J4489 Other specified chronic obstructive pulmonary disease: Secondary | ICD-10-CM | POA: Diagnosis present

## 2017-05-21 DIAGNOSIS — Z888 Allergy status to other drugs, medicaments and biological substances status: Secondary | ICD-10-CM

## 2017-05-21 DIAGNOSIS — J449 Chronic obstructive pulmonary disease, unspecified: Secondary | ICD-10-CM | POA: Diagnosis not present

## 2017-05-21 DIAGNOSIS — K219 Gastro-esophageal reflux disease without esophagitis: Secondary | ICD-10-CM | POA: Diagnosis present

## 2017-05-21 DIAGNOSIS — F17210 Nicotine dependence, cigarettes, uncomplicated: Secondary | ICD-10-CM | POA: Diagnosis present

## 2017-05-21 DIAGNOSIS — D62 Acute posthemorrhagic anemia: Secondary | ICD-10-CM | POA: Diagnosis not present

## 2017-05-21 DIAGNOSIS — Z90710 Acquired absence of both cervix and uterus: Secondary | ICD-10-CM | POA: Diagnosis not present

## 2017-05-21 DIAGNOSIS — M5416 Radiculopathy, lumbar region: Secondary | ICD-10-CM | POA: Diagnosis present

## 2017-05-21 DIAGNOSIS — Z800 Family history of malignant neoplasm of digestive organs: Secondary | ICD-10-CM | POA: Insufficient documentation

## 2017-05-21 DIAGNOSIS — Z9049 Acquired absence of other specified parts of digestive tract: Secondary | ICD-10-CM | POA: Insufficient documentation

## 2017-05-21 DIAGNOSIS — Z90721 Acquired absence of ovaries, unilateral: Secondary | ICD-10-CM | POA: Insufficient documentation

## 2017-05-21 DIAGNOSIS — K5731 Diverticulosis of large intestine without perforation or abscess with bleeding: Secondary | ICD-10-CM | POA: Diagnosis present

## 2017-05-21 DIAGNOSIS — Z7951 Long term (current) use of inhaled steroids: Secondary | ICD-10-CM | POA: Diagnosis not present

## 2017-05-21 DIAGNOSIS — M545 Low back pain: Secondary | ICD-10-CM | POA: Diagnosis not present

## 2017-05-21 DIAGNOSIS — F32A Depression, unspecified: Secondary | ICD-10-CM | POA: Diagnosis present

## 2017-05-21 DIAGNOSIS — K621 Rectal polyp: Secondary | ICD-10-CM | POA: Diagnosis present

## 2017-05-21 DIAGNOSIS — K922 Gastrointestinal hemorrhage, unspecified: Principal | ICD-10-CM | POA: Diagnosis present

## 2017-05-21 DIAGNOSIS — G8929 Other chronic pain: Secondary | ICD-10-CM | POA: Diagnosis not present

## 2017-05-21 DIAGNOSIS — K7460 Unspecified cirrhosis of liver: Secondary | ICD-10-CM

## 2017-05-21 DIAGNOSIS — D122 Benign neoplasm of ascending colon: Secondary | ICD-10-CM | POA: Insufficient documentation

## 2017-05-21 DIAGNOSIS — Z9889 Other specified postprocedural states: Secondary | ICD-10-CM | POA: Insufficient documentation

## 2017-05-21 LAB — COMPREHENSIVE METABOLIC PANEL
ALT: 30 U/L (ref 14–54)
ANION GAP: 10 (ref 5–15)
AST: 37 U/L (ref 15–41)
Albumin: 3.8 g/dL (ref 3.5–5.0)
Alkaline Phosphatase: 90 U/L (ref 38–126)
BUN: 10 mg/dL (ref 6–20)
CHLORIDE: 108 mmol/L (ref 101–111)
CO2: 20 mmol/L — ABNORMAL LOW (ref 22–32)
Calcium: 8.7 mg/dL — ABNORMAL LOW (ref 8.9–10.3)
Creatinine, Ser: 0.81 mg/dL (ref 0.44–1.00)
GFR calc Af Amer: >60 mL/min (ref >60)
Glucose, Bld: 146 mg/dL — ABNORMAL HIGH (ref 65–99)
POTASSIUM: 4.1 mmol/L (ref 3.5–5.1)
Sodium: 138 mmol/L (ref 135–145)
Total Bilirubin: 0.9 mg/dL (ref 0.3–1.2)
Total Protein: 7.4 g/dL (ref 6.5–8.1)

## 2017-05-21 LAB — I-STAT CHEM 8, ED
BUN: 10 mg/dL (ref 6–20)
CALCIUM ION: 1.15 mmol/L (ref 1.15–1.40)
CREATININE: 0.7 mg/dL (ref 0.44–1.00)
Chloride: 110 mmol/L (ref 101–111)
Glucose, Bld: 142 mg/dL — ABNORMAL HIGH (ref 65–99)
HCT: 42 % (ref 36.0–46.0)
HEMOGLOBIN: 14.3 g/dL (ref 12.0–15.0)
Potassium: 4.2 mmol/L (ref 3.5–5.1)
Sodium: 141 mmol/L (ref 135–145)
TCO2: 21 mmol/L (ref 0–100)

## 2017-05-21 LAB — CBC
HCT: 36.1 % (ref 36.0–46.0)
HEMATOCRIT: 40.8 % (ref 36.0–46.0)
Hemoglobin: 13.9 g/dL (ref 12.0–15.0)
Hemoglobin: 12.2 g/dL (ref 12.0–15.0)
MCH: 31.5 pg (ref 26.0–34.0)
MCH: 31.2 pg (ref 26.0–34.0)
MCHC: 34.1 g/dL (ref 30.0–36.0)
MCHC: 33.8 g/dL (ref 30.0–36.0)
MCV: 92.5 fL (ref 78.0–100.0)
MCV: 92.3 fL (ref 78.0–100.0)
PLATELETS: 245 10*3/uL (ref 150–400)
Platelets: 208 10*3/uL (ref 150–400)
RBC: 4.41 MIL/uL (ref 3.87–5.11)
RBC: 3.91 MIL/uL (ref 3.87–5.11)
RDW: 12.7 % (ref 11.5–15.5)
RDW: 12.8 % (ref 11.5–15.5)
WBC: 17.8 10*3/uL — AB (ref 4.0–10.5)
WBC: 18.1 10*3/uL — ABNORMAL HIGH (ref 4.0–10.5)

## 2017-05-21 LAB — POC OCCULT BLOOD, ED: Fecal Occult Bld: POSITIVE — AB

## 2017-05-21 LAB — ABO/RH: ABO/RH(D): AB POS

## 2017-05-21 LAB — HEMOGLOBIN AND HEMATOCRIT, BLOOD
HCT: 35.5 % — ABNORMAL LOW (ref 36.0–46.0)
Hemoglobin: 11.9 g/dL — ABNORMAL LOW (ref 12.0–15.0)

## 2017-05-21 MED ORDER — SODIUM CHLORIDE 0.9 % IV BOLUS (SEPSIS)
1000.0000 mL | Freq: Once | INTRAVENOUS | Status: AC
  Administered 2017-05-21: 1000 mL via INTRAVENOUS

## 2017-05-21 MED ORDER — OXYCODONE HCL 5 MG PO TABS
5.0000 mg | ORAL_TABLET | Freq: Four times a day (QID) | ORAL | Status: DC | PRN
  Administered 2017-05-21 – 2017-05-23 (×5): 5 mg via ORAL
  Filled 2017-05-21 (×5): qty 1

## 2017-05-21 MED ORDER — SODIUM CHLORIDE 0.9% FLUSH
3.0000 mL | Freq: Two times a day (BID) | INTRAVENOUS | Status: DC
  Administered 2017-05-22: 3 mL via INTRAVENOUS

## 2017-05-21 MED ORDER — PEG-KCL-NACL-NASULF-NA ASC-C 100 G PO SOLR: 1.0000 | Freq: Once | ORAL | Status: DC

## 2017-05-21 MED ORDER — PEG-KCL-NACL-NASULF-NA ASC-C 100 G PO SOLR
0.5000 | Freq: Once | ORAL | Status: AC
  Administered 2017-05-21: 100 g via ORAL

## 2017-05-21 MED ORDER — PANTOPRAZOLE SODIUM 40 MG PO TBEC
40.0000 mg | DELAYED_RELEASE_TABLET | Freq: Every day | ORAL | Status: DC
  Administered 2017-05-21 – 2017-05-23 (×2): 40 mg via ORAL
  Filled 2017-05-21 (×2): qty 1

## 2017-05-21 MED ORDER — DEXTROSE-NACL 5-0.45 % IV SOLN: INTRAVENOUS | Status: DC

## 2017-05-21 MED ORDER — ACETAMINOPHEN 325 MG PO TABS
650.0000 mg | ORAL_TABLET | Freq: Four times a day (QID) | ORAL | Status: DC | PRN
  Administered 2017-05-22 – 2017-05-23 (×2): 650 mg via ORAL
  Filled 2017-05-21 (×3): qty 2

## 2017-05-21 MED ORDER — ONDANSETRON HCL 4 MG/2ML IJ SOLN: 4.0000 mg | Freq: Four times a day (QID) | INTRAMUSCULAR | Status: DC | PRN

## 2017-05-21 MED ORDER — DULOXETINE HCL 30 MG PO CPEP
30.0000 mg | ORAL_CAPSULE | Freq: Every day | ORAL | Status: DC
  Filled 2017-05-21 (×2): qty 1

## 2017-05-21 MED ORDER — BUDESONIDE 0.25 MG/2ML IN SUSP
0.2500 mg | Freq: Two times a day (BID) | RESPIRATORY_TRACT | Status: DC
  Administered 2017-05-21 – 2017-05-23 (×4): 0.25 mg via RESPIRATORY_TRACT
  Filled 2017-05-21 (×4): qty 2

## 2017-05-21 MED ORDER — ONDANSETRON HCL 4 MG PO TABS
4.0000 mg | ORAL_TABLET | Freq: Four times a day (QID) | ORAL | Status: DC | PRN
  Administered 2017-05-21 – 2017-05-22 (×2): 4 mg via ORAL
  Filled 2017-05-21 (×2): qty 1

## 2017-05-21 MED ORDER — ALBUTEROL SULFATE (2.5 MG/3ML) 0.083% IN NEBU
2.5000 mg | INHALATION_SOLUTION | RESPIRATORY_TRACT | Status: DC | PRN
Start: 2017-05-21 — End: 2017-05-23

## 2017-05-21 MED ORDER — SODIUM CHLORIDE 0.9 % IV SOLN
INTRAVENOUS | Status: DC
  Administered 2017-05-21 – 2017-05-22 (×2): via INTRAVENOUS

## 2017-05-21 MED ORDER — ACETAMINOPHEN 650 MG RE SUPP: 650.0000 mg | Freq: Four times a day (QID) | RECTAL | Status: DC | PRN

## 2017-05-21 MED ORDER — PEG-KCL-NACL-NASULF-NA ASC-C 100 G PO SOLR
0.5000 | Freq: Once | ORAL | Status: AC
  Administered 2017-05-21: 100 g via ORAL
  Filled 2017-05-21: qty 1

## 2017-05-21 MED ORDER — BECLOMETHASONE DIPROPIONATE 40 MCG/ACT IN AERS: 2.0000 | INHALATION_SPRAY | Freq: Two times a day (BID) | RESPIRATORY_TRACT | Status: DC

## 2017-05-21 NOTE — ED Triage Notes (Signed)
Pt from home with complaints of rectal bleeding that began today. Pt states she has been taking naproxen and motrin at home for a tooth ache and back pain. Pt states she has "filled up the toilet with blood 5 times" today. Pt denies hx of hemorrhoids or rectal bleeds. Pt denies lightheadedness but states she intermittently feels a little bit dizzy. Pt is not on blood thinners.

## 2017-05-21 NOTE — H&P (Addendum)
HISTORY AND PHYSICAL       PATIENT DETAILS Name: Donna Sullivan Age: 52 y.o. Sex: female Date of Birth: May 28, 1966 Admit Date: 05/21/2017 XKG:[id number], No Pcp Per   Patient coming from: Home   CHIEF COMPLAINT:  Hematochezia since this morning  HPI: Donna Sullivan is a 51 y.o. female with medical history significant of chronic bronchitis, chronic back pain, history of discoid lupus who presented to the ED for evaluation of the above-noted complaints. Per patient, she is had approximately 9 episodes of bright red blood per rectum since this morning. There is no history of abdominal pain. She subsequently proceeded to the ED for further evaluation, GI was consulted, and the hospitalist service was asked to admit this patient for further evaluation and treatment.  Per patient, she has had no prior history of GI bleeding, she has not had any endoscopic evaluation in the past. Patient does take occasional ibuprofen, but is not on aspirin or any anticoagulants.  ED Course:  Hemoglobin of 14.3-GI consulted, hospitalist service asked to admit this patient  Note: Lives at: Home Mobility: Independent Chronic Indwelling Foley:no   REVIEW OF SYSTEMS:  Constitutional:   No  weight loss, night sweats,  Fevers, chills, fatigue.  HEENT:    No headaches, Dysphagia,Tooth/dental problems,Sore throat  Cardio-vascular: No chest pain,Orthopnea, PND,lower extremity edema, anasarca, palpitations  GI:  No heartburn, indigestion, abdominal pain, nausea, vomiting, diarrhea   Resp: No shortness of breath, cough, hemoptysis,plueritic chest pain.   Skin:  No rash or lesions.  GU:  No dysuria, change in color of urine, no urgency or frequency.  No flank pain.  Musculoskeletal: No joint pain or swelling.  No decreased range of motion.  No back pain.  Endocrine: No heat intolerance, no cold intolerance, no polyuria, no polydipsia  Psych: No change in mood or affect. No depression or  anxiety.  No memory loss.   ALLERGIES:   Allergies  Allergen Reactions  . Symbicort [Budesonide-Formoterol Fumarate] Other (See Comments)    Caused open sores in mouth   . Tramadol Shortness Of Breath  . Amoxicillin-Pot Clavulanate     REACTION: dizziness, fatigue/drowsiness and nausea  . Bactrim [Sulfamethoxazole-Trimethoprim]     Blister in mouth  . Ciprofloxacin Hcl     Blisters in mouth  . Clindamycin/Lincomycin Other (See Comments)    Sores in mouth   . Doxycycline     Fatigue, sleepy, worse   . Keflex [Cephalexin]     Blister in mouth  . Wellbutrin [Bupropion]     Blisters in mouth    PAST MEDICAL HISTORY: Past Medical History:  Diagnosis Date  . Anxiety   . Asthma   . Chronic kidney disease 1985   BLOCKED TUBE IN KIDNEY  . Discoid lupus    Dr Ronnald Ramp, dermatologist  . GERD (gastroesophageal reflux disease)   . Headache(784.0)    MIGRAINES  . Insomnia   . Seizures ([place])    EPILEPSY- NO SEIZURES SINCE AGE 22  . Tendonitis     PAST SURGICAL HISTORY: Past Surgical History:  Procedure Laterality Date  . ABDOMINAL HYSTERECTOMY    . ANKLE RECONSTRUCTION  04/2009  . APPENDECTOMY    . CESAREAN SECTION    . CHOLECYSTECTOMY    . DIAGNOSTIC LAPAROSCOPY    . KIDNEY SURGERY     right, straightened tube  . LAPAROSCOPY  09/23/2012   Procedure: LAPAROSCOPY OPERATIVE;  Surgeon: Cheri Fowler, MD;  Location: [facility];  Service: Gynecology;  Laterality: N/A;  . OTHER SURGICAL HISTORY     Hysterectomy with 10cm grown removal, Dr. Willis Modena  . RIGHT OOPHORECTOMY      MEDICATIONS AT HOME: Prior to Admission medications   Medication Sig Start Date End Date Taking? Authorizing Provider  DULoxetine (CYMBALTA) 30 MG capsule Take 1 capsule (30 mg total) by mouth daily. 10/05/16  Yes Midge Minium, MD  QVAR 40 MCG/ACT inhaler Inhale 2 puffs into the lungs 2 (two) times daily. 01/17/17  Yes Carlota Raspberry, Tiffany, PA-C  albuterol (PROVENTIL) (2.5 MG/3ML) 0.083% nebulizer solution  Take 3 mLs (2.5 mg total) by nebulization every 6 (six) hours as needed for wheezing or shortness of breath. Patient not taking: Reported on 05/21/2017 01/17/17   Delos Haring, PA-C  albuterol (VENTOLIN HFA) 108 (90 Base) MCG/ACT inhaler INHALE 1-2 PUFFS INTO THE LUNGS EVERY 6 HOURS AS NEEDED FOR WHEEZING OR SHORTNESS OF BREATH Patient not taking: Reported on 05/21/2017 09/06/16   Midge Minium, MD  azithromycin (ZITHROMAX) 250 MG tablet Take 1 tablet (250 mg total) by mouth daily. Take first 2 tablets together, then 1 every day until finished. Patient not taking: Reported on 05/21/2017 01/17/17   Delos Haring, PA-C  benzonatate (TESSALON PERLES) 100 MG capsule Take 1 capsule (100 mg total) by mouth 3 (three) times daily as needed for cough. Patient not taking: Reported on 05/21/2017 01/17/17   Delos Haring, PA-C  cetirizine (ZYRTEC) 10 MG tablet Take 1 tablet (10 mg total) by mouth daily. Patient not taking: Reported on 05/21/2017 09/06/16   Midge Minium, MD  ibuprofen (ADVIL,MOTRIN) 800 MG tablet TAKE 1 TABLET BY MOUTH EVERY 8 HOURS AS NEEDED FOR MILD PAIN Patient not taking: Reported on 05/21/2017 10/01/16   Midge Minium, MD  LORazepam (ATIVAN) 0.5 MG tablet Take 1 tablet (0.5 mg total) by mouth 2 (two) times daily as needed. for anxiety Patient not taking: Reported on 05/21/2017 09/20/16   Midge Minium, MD  methocarbamol (ROBAXIN) 500 MG tablet Take 1 tablet (500 mg total) by mouth every 8 (eight) hours as needed for muscle spasms. Patient not taking: Reported on 05/21/2017 10/05/16   Midge Minium, MD  predniSONE (DELTASONE) 10 MG tablet Take 2 tablets (20 mg total) by mouth daily. Patient not taking: Reported on 05/21/2017 01/17/17   Delos Haring, PA-C    FAMILY HISTORY: Family History  Problem Relation Age of Onset  . Coronary artery disease Mother   . Hypertension Mother   . Hypertension Father   . Colon cancer Unknown        grandfather    SOCIAL  HISTORY:  reports that she has been smoking Cigarettes.  She has been smoking about 0.50 packs per day. She has never used smokeless tobacco. She reports that she drinks alcohol. She reports that she does not use drugs.  PHYSICAL EXAM: Blood pressure 116/65, pulse 77, temperature 97.9 F (36.6 C), temperature source Oral, resp. rate 17, height 5' 1.75" (1.568 m), weight 90.7 kg (200 lb), SpO2 100 %.  General appearance :Awake, alert, not in any distress. Speech Clear. Not toxic Looking Eyes:, pupils equally reactive to light and accomodation,no scleral icterus.Pink conjunctiva HEENT: Atraumatic and Normocephalic Neck: supple, no JVD. No cervical lymphadenopathy. No thyromegaly Resp:Good air entry bilaterally, no added sounds  CVS: S1 S2 regular, no murmurs.  GI: Bowel sounds present, Non tender and not distended with no gaurding, rigidity or rebound.No organomegaly Extremities: B/L Lower Ext shows no edema, both legs are warm to touch  Neurology:  speech clear,Non focal, sensation is grossly intact. Psychiatric: Normal judgment and insight. Alert and oriented x 3. Normal mood. Musculoskeletal:No digital cyanosis Skin:No Rash, warm and dry Wounds:N/A  LABS ON ADMISSION:  I have personally reviewed following labs and imaging studies  CBC:  Recent Labs Lab 05/21/17 1411 05/21/17 1415  WBC 17.8*  --   HGB 13.9 14.3  HCT 40.8 42.0  MCV 92.5  --   PLT 245  --     Basic Metabolic Panel:  Recent Labs Lab 05/21/17 1411 05/21/17 1415  NA 138 141  K 4.1 4.2  CL 108 110  CO2 20*  --   GLUCOSE 146* 142*  BUN 10 10  CREATININE 0.81 0.70  CALCIUM 8.7*  --     GFR: Estimated Creatinine Clearance: 87.7 mL/min (by C-G formula based on SCr of 0.7 mg/dL).  Liver Function Tests:  Recent Labs Lab 05/21/17 1411  AST 37  ALT 30  ALKPHOS 90  BILITOT 0.9  PROT 7.4  ALBUMIN 3.8   No results for input(s): LIPASE, AMYLASE in the last 168 hours. No results for input(s): AMMONIA  in the last 168 hours.  Coagulation Profile: No results for input(s): INR, PROTIME in the last 168 hours.  Cardiac Enzymes: No results for input(s): CKTOTAL, CKMB, CKMBINDEX, TROPONINI in the last 168 hours.  BNP (last 3 results) No results for input(s): PROBNP in the last 8760 hours.  HbA1C: No results for input(s): HGBA1C in the last 72 hours.  CBG: No results for input(s): GLUCAP in the last 168 hours.  Lipid Profile: No results for input(s): CHOL, HDL, LDLCALC, TRIG, CHOLHDL, LDLDIRECT in the last 72 hours.  Thyroid Function Tests: No results for input(s): TSH, T4TOTAL, FREET4, T3FREE, THYROIDAB in the last 72 hours.  Anemia Panel: No results for input(s): VITAMINB12, FOLATE, FERRITIN, TIBC, IRON, RETICCTPCT in the last 72 hours.  Urine analysis:    Component Value Date/Time   COLORURINE YELLOW 07/13/2014 1417   APPEARANCEUR CLOUDY (A) 07/13/2014 1417   LABSPEC 1.010 07/13/2014 1417   PHURINE 5.5 07/13/2014 1417   GLUCOSEU NEGATIVE 07/13/2014 1417   HGBUR TRACE (A) 07/13/2014 1417   HGBUR negative 01/22/2011 1545   BILIRUBINUR negative 09/06/2016 1059   KETONESUR NEGATIVE 07/13/2014 1417   PROTEINUR + 09/06/2016 1059   PROTEINUR NEGATIVE 07/13/2014 1417   UROBILINOGEN 1.0 09/06/2016 1059   UROBILINOGEN 0.2 07/13/2014 1417   NITRITE negative 09/06/2016 1059   NITRITE NEGATIVE 07/13/2014 1417   LEUKOCYTESUR Negative 09/06/2016 1059    Sepsis Labs: Lactic Acid, Venous No results found for: [place]   Microbiology: No results found for this or any previous visit (from the past 240 hour(s)).    RADIOLOGIC STUDIES ON ADMISSION: No results found.  ASSESSMENT AND PLAN: Lower GI bleeding: Etiology unknown-no endoscopic evaluation in the past-it is painless-not sure whether this is diverticular bleeding. Her hemoglobin normally runs around 15-17-I hemoglobin currently is around 13. She will be admitted to a telemetry unit, CBC will be monitored very  frequently and if she significant drop in the hemoglobin she will likely need to be transfused. Gastroenterology has already been consulted, plans are for endoscopic evaluation with a colonoscopy tomorrow morning. In the meantime if the patient develops hemodynamic instability-GI will need to be reconsulted, or if patient continues to have persistent/severe hematochezia-she may need a red blood cell tagged RBC scan. We will monitor closely.  Chronic bronchitis: Lungs clear, continue her usual inhaler regimen.  History of chronic back pain: Usually  takes NSAIDs-avoid NSAIDs due to active bleeding-have asked patient to try Tylenol first, if that doesn't help then we'll provide as needed narcotics.  History of discoid lupus: I do not see any major active skin lesions at this time. Defer further to her outpatient M.D.  Depression: Continue Cymbalta.  Further plan will depend as patient's clinical course evolves and further radiologic and laboratory data become available. Patient will be monitored closely.  Above noted plan was discussed with patient/mother face to face at bedside, they were in agreement.   CONSULTS: None  DVT Prophylaxis: SCD's  Code Status: Full Code  Disposition Plan:  Discharge back home possibly in 2 days, depending on clinical course  Admission status: Inpatient  going to tele  The medical decision making on this patient was of high complexity and the patient is at high risk for clinical deterioration, therefore this is a level 3 visit.  Total time spent  55 minutes.Greater than 50% of this time was spent in counseling, explanation of diagnosis, planning of further management, and coordination of care.  Oren Binet [facility] Pager [phone]  If 7PM-7AM, please contact night-coverage [email or web address] Password [facility] 05/21/2017, 3:38 PM

## 2017-05-21 NOTE — ED Notes (Signed)
Pt. Refuses to use the bedpan. Pt. Took Off B/P Cuff, Pulse Ox, and rail down to go to the restroom. Nurse aware.

## 2017-05-21 NOTE — ED Notes (Signed)
Bed: WA04 Expected date:  Expected time:  Means of arrival:  Comments: Hold for triage 3

## 2017-05-21 NOTE — ED Notes (Signed)
Family at bedside. 

## 2017-05-21 NOTE — Consult Note (Signed)
Consultation  Referring Provider: [facility] / Dr Cathleen Fears Primary Care Physician:  Patient, No Pcp Per-discharged from  [facility] Primary care Primary Gastroenterologist:  None unassigned  Reason for Consultation:  Rectal bleeding  HPI: Donna Sullivan is a 51 y.o. female who presented to the emergency room today with acute onset of rectal bleeding. Patient has no previous GI history and has not had any previous GI evaluation. She does have history of apparent discoid lupus, migraine headaches, chronic kidney disease, and a chronic pain syndrome secondary to chronic lumbar back pain with radiculopathy. Patient says she was feeling fine yesterday, and felt fine this morning initially. About 10 AM she had urge for bowel movement and felt as if she was can have diarrhea. She said she had a bowel movement and passed a lot of red blood and clots and not any significant amount of stool. This was not associated with any significant abdominal pain and cramping diaphoresis etc. She then had 4 more similar episodes at home prior to coming to the emergency room. She describes them all is being read blood and clots. She has had a couple of more episodes in the emergency room and by her report the volume has not decreased as yet. She's had some mild lightheadedness denies any dizziness. Vitals have been stable since arrival to the ER. WBC 19.8, hemoglobin 13.9 hematocrit of 40.8. By reviewing previous labs her baseline hemoglobin is in the 15 range. BUN is normal, LFTs within normal limits. Previous CT of the abdomen and pelvis done in September 2017 did comment that she has diverticulosis and a nodular contour to her liver consistent with early cirrhosis. Also mentioned a 6 mm lesion in the right hepatic lobe favoring a benign vascular lesion but MRI was to be considered. Patient has no family history of colon cancer or polyps or inflammatory bowel disease. There is cirrhosis on her maternal side, most of it EtOH  related. Patient denies any regular EtOH use. She has been told in the past that she had a fatty liver.  She has been taking ibuprofen 800 mg 3 times daily fairly chronically also takes naproxen intermittently and has been taking Percocet recently for tooth pain. She also took a Fioricet yesterday from a family member for headache. Patient is status post appendectomy hysterectomy and cholecystectomy.   Past Medical History:  Diagnosis Date  . Anxiety   . Asthma   . Chronic kidney disease 1985   BLOCKED TUBE IN KIDNEY  . Discoid lupus    Dr Ronnald Ramp, dermatologist  . GERD (gastroesophageal reflux disease)   . Headache(784.0)    MIGRAINES  . Insomnia   . Seizures ([place])    EPILEPSY- NO SEIZURES SINCE AGE 37  . Tendonitis     Past Surgical History:  Procedure Laterality Date  . ABDOMINAL HYSTERECTOMY    . ANKLE RECONSTRUCTION  04/2009  . APPENDECTOMY    . CESAREAN SECTION    . CHOLECYSTECTOMY    . DIAGNOSTIC LAPAROSCOPY    . KIDNEY SURGERY     right, straightened tube  . LAPAROSCOPY  09/23/2012   Procedure: LAPAROSCOPY OPERATIVE;  Surgeon: Cheri Fowler, MD;  Location: [facility];  Service: Gynecology;  Laterality: N/A;  . OTHER SURGICAL HISTORY     Hysterectomy with 10cm grown removal, Dr. Willis Modena  . RIGHT OOPHORECTOMY      Prior to Admission medications   Medication Sig Start Date End Date Taking? Authorizing Provider  albuterol (PROVENTIL) (2.5 MG/3ML) 0.083% nebulizer solution Take  3 mLs (2.5 mg total) by nebulization every 6 (six) hours as needed for wheezing or shortness of breath. 01/17/17  Yes Carlota Raspberry, Tiffany, PA-C  albuterol (VENTOLIN HFA) 108 (90 Base) MCG/ACT inhaler INHALE 1-2 PUFFS INTO THE LUNGS EVERY 6 HOURS AS NEEDED FOR WHEEZING OR SHORTNESS OF BREATH 09/06/16   Midge Minium, MD  azithromycin (ZITHROMAX) 250 MG tablet Take 1 tablet (250 mg total) by mouth daily. Take first 2 tablets together, then 1 every day until finished. 01/17/17   Delos Haring, PA-C    benzonatate (TESSALON PERLES) 100 MG capsule Take 1 capsule (100 mg total) by mouth 3 (three) times daily as needed for cough. 01/17/17   Delos Haring, PA-C  cetirizine (ZYRTEC) 10 MG tablet Take 1 tablet (10 mg total) by mouth daily. 09/06/16   Midge Minium, MD  DULoxetine (CYMBALTA) 30 MG capsule Take 1 capsule (30 mg total) by mouth daily. 10/05/16   Midge Minium, MD  ibuprofen (ADVIL,MOTRIN) 800 MG tablet TAKE 1 TABLET BY MOUTH EVERY 8 HOURS AS NEEDED FOR MILD PAIN 10/01/16   Midge Minium, MD  LORazepam (ATIVAN) 0.5 MG tablet Take 1 tablet (0.5 mg total) by mouth 2 (two) times daily as needed. for anxiety 09/20/16   Midge Minium, MD  methocarbamol (ROBAXIN) 500 MG tablet Take 1 tablet (500 mg total) by mouth every 8 (eight) hours as needed for muscle spasms. 10/05/16   Midge Minium, MD  predniSONE (DELTASONE) 10 MG tablet Take 2 tablets (20 mg total) by mouth daily. 01/17/17   Delos Haring, PA-C  QVAR 40 MCG/ACT inhaler Inhale 2 puffs into the lungs 2 (two) times daily. 01/17/17   Delos Haring, PA-C    No current facility-administered medications for this encounter.    Current Outpatient Prescriptions  Medication Sig Dispense Refill  . albuterol (PROVENTIL) (2.5 MG/3ML) 0.083% nebulizer solution Take 3 mLs (2.5 mg total) by nebulization every 6 (six) hours as needed for wheezing or shortness of breath. 75 mL 12  . albuterol (VENTOLIN HFA) 108 (90 Base) MCG/ACT inhaler INHALE 1-2 PUFFS INTO THE LUNGS EVERY 6 HOURS AS NEEDED FOR WHEEZING OR SHORTNESS OF BREATH 18 g 6  . azithromycin (ZITHROMAX) 250 MG tablet Take 1 tablet (250 mg total) by mouth daily. Take first 2 tablets together, then 1 every day until finished. 6 tablet 0  . benzonatate (TESSALON PERLES) 100 MG capsule Take 1 capsule (100 mg total) by mouth 3 (three) times daily as needed for cough. 20 capsule 0  . cetirizine (ZYRTEC) 10 MG tablet Take 1 tablet (10 mg total) by mouth daily. 30 tablet 11   . DULoxetine (CYMBALTA) 30 MG capsule Take 1 capsule (30 mg total) by mouth daily. 30 capsule 1  . ibuprofen (ADVIL,MOTRIN) 800 MG tablet TAKE 1 TABLET BY MOUTH EVERY 8 HOURS AS NEEDED FOR MILD PAIN 30 tablet 1  . LORazepam (ATIVAN) 0.5 MG tablet Take 1 tablet (0.5 mg total) by mouth 2 (two) times daily as needed. for anxiety 30 tablet 0  . methocarbamol (ROBAXIN) 500 MG tablet Take 1 tablet (500 mg total) by mouth every 8 (eight) hours as needed for muscle spasms. 30 tablet 0  . predniSONE (DELTASONE) 10 MG tablet Take 2 tablets (20 mg total) by mouth daily. 15 tablet 0  . QVAR 40 MCG/ACT inhaler Inhale 2 puffs into the lungs 2 (two) times daily. 1 Inhaler 6    Allergies as of 05/21/2017 - Review Complete 05/21/2017  Allergen Reaction Noted  .  Symbicort [budesonide-formoterol fumarate] Other (See Comments) 04/21/2015  . Tramadol Shortness Of Breath 03/05/2014  . Amoxicillin-pot clavulanate  01/23/2011  . Bactrim [sulfamethoxazole-trimethoprim]  12/04/2012  . Ciprofloxacin hcl  09/11/2011  . Clindamycin/lincomycin Other (See Comments) 08/18/2015  . Doxycycline  05/13/2012  . Keflex [cephalexin]  12/04/2012  . Wellbutrin [bupropion]  11/14/2012    Family History  Problem Relation Age of Onset  . Coronary artery disease Mother   . Hypertension Mother   . Hypertension Father   . Colon cancer Unknown        grandfather    Social History   Social History  . Marital status: Married    Spouse name: N/A  . Number of children: N/A  . Years of education: N/A   Occupational History  . Not on file.   Social History Main Topics  . Smoking status: Current Every Day Smoker    Packs/day: 0.50    Types: Cigarettes  . Smokeless tobacco: Never Used     Comment: Maybe later  . Alcohol use Yes     Comment: occ  . Drug use: No  . Sexual activity: Not on file   Other Topics Concern  . Not on file   Social History Narrative   Lives with daughter.    ROS;  Pertinent positive and  negative review of systems were noted in the above HPI section.  All other review of systems was otherwise negative.  Physical Exam: Vital signs in last 24 hours: Temp:  [97.9 F (36.6 C)] 97.9 F (36.6 C) (05/22 1302) Pulse Rate:  [77-78] 77 (05/22 1440) Resp:  [15-17] 17 (05/22 1440) BP: (113-120)/(63-88) 116/65 (05/22 1440) SpO2:  [99 %-100 %] 100 % (05/22 1440) Weight:  [200 lb (90.7 kg)] 200 lb (90.7 kg) (05/22 1315)   General:   Alert,  Well-developed, well-nourished, white female ,pleasant and cooperative in NAD Head:  Normocephalic and atraumatic. Eyes:  Sclera clear, no icterus.   Conjunctiva pink. Ears:  Normal auditory acuity. Nose:  No deformity, discharge,  or lesions. Mouth:  No deformity or lesions.   Neck:  Supple; no masses or thyromegaly. Lungs:  Clear throughout to auscultation.   Scattered rhonchi bilaterally. Heart:  Regular rate and rhythm; no murmurs, clicks, rubs,  or gallops. Abdomen:  Soft, mild tenderness left lower quadrant, BS active,nonpalp mass or hsm.   Rectal:  Deferred -bright red blood and clots documented per ER Msk:  Symmetrical without gross deformities. . Pulses:  Normal pulses noted. Extremities:  Without clubbing or edema. Neurologic:  Alert and  oriented x4;  grossly normal neurologically. Skin:  Intact without significant lesions or rashes.. Psych:  Alert and cooperative. Normal mood and affect.  Intake/Output from previous day: No intake/output data recorded. Intake/Output this shift: No intake/output data recorded.  Lab Results:  Recent Labs  05/21/17 1411 05/21/17 1415  WBC 17.8*  --   HGB 13.9 14.3  HCT 40.8 42.0  PLT 245  --    BMET  Recent Labs  05/21/17 1411 05/21/17 1415  NA 138 141  K 4.1 4.2  CL 108 110  CO2 20*  --   GLUCOSE 146* 142*  BUN 10 10  CREATININE 0.81 0.70  CALCIUM 8.7*  --    LFT  Recent Labs  05/21/17 1411  PROT 7.4  ALBUMIN 3.8  AST 37  ALT 30  ALKPHOS 90  BILITOT 0.9    PT/INR No results for input(s): LABPROT, INR in the last 72 hours. Hepatitis Panel  No results for input(s): HEPBSAG, HCVAB, HEPAIGM, HEPBIGM in the last 72 hours.   IMPRESSION:   #76  51 year old white female with acute lower GI bleed of unclear etiology. Suspect this may be diverticular as has been painless. She has been taking fairly high dose NSAIDs on a regular basis which may also be contributory. #2 abnormal liver on previous CT-rule out cirrhosis #3 chronic pain syndrome/chronic lumbar back pain-with chronic NSAIDs and intermittent narcotic use #4 discoid lupus #5 status post appendectomy hysterectomy and cholecystectomy #6 leukocytosis    PLAN: #1 clear liquid diet, nothing by mouth after midnight #2 serial hemoglobins every 6 hours and transfuse for hemoglobin 8 or less #3 have scheduled for colonoscopy with Dr. Havery Moros in a.m. 05/22/2017. Procedure discussed in detail with the patient and she is agreeable to proceed. #4 bowel prep this evening #5 if patient has ongoing active bleeding and develops any hemodynamic instability-she may need nuclear medicine bleeding scan plus minus angiogram for embolization. This was discussed with hospitalist as well. #6 stop NSAID use #7 Will schedule for upper abdominal ultrasound tomorrow after her procedure. Thank you Will follow with you.   Amy Esterwood  05/21/2017, 3:02 PM

## 2017-05-21 NOTE — ED Notes (Signed)
Pt had large amount of bright red bloodly bowel movement.  M.d made aware. Will continue to monitor patient.

## 2017-05-21 NOTE — ED Notes (Signed)
Bed: [facility] Expected date:  Expected time:  Means of arrival:  Comments: 

## 2017-05-22 ENCOUNTER — Encounter (HOSPITAL_COMMUNITY): Payer: Self-pay | Admitting: *Deleted

## 2017-05-22 ENCOUNTER — Encounter (HOSPITAL_COMMUNITY)
Admission: EM | Disposition: A | Discharge status: Home / Self Care | Payer: Self-pay | Source: Home / Self Care | Attending: Emergency Medicine

## 2017-05-22 ENCOUNTER — Inpatient Hospital Stay (HOSPITAL_COMMUNITY): Payer: 59 | Admitting: Anesthesiology

## 2017-05-22 DIAGNOSIS — K635 Polyp of colon: Secondary | ICD-10-CM

## 2017-05-22 DIAGNOSIS — K922 Gastrointestinal hemorrhage, unspecified: Secondary | ICD-10-CM | POA: Diagnosis not present

## 2017-05-22 DIAGNOSIS — D125 Benign neoplasm of sigmoid colon: Secondary | ICD-10-CM

## 2017-05-22 HISTORY — PX: COLONOSCOPY WITH PROPOFOL: SHX5780

## 2017-05-22 LAB — PROTIME-INR
INR: 1.26
Prothrombin Time: 15.9 seconds — ABNORMAL HIGH (ref 11.4–15.2)

## 2017-05-22 LAB — IRON AND TIBC
IRON: 100 ug/dL (ref 28–170)
Saturation Ratios: 43 % — ABNORMAL HIGH (ref 10.4–31.8)
TIBC: 235 ug/dL — AB (ref 250–450)
UIBC: 135 ug/dL

## 2017-05-22 LAB — BASIC METABOLIC PANEL
ANION GAP: 8 (ref 5–15)
BUN: 15 mg/dL (ref 6–20)
CHLORIDE: 111 mmol/L (ref 101–111)
CO2: 18 mmol/L — ABNORMAL LOW (ref 22–32)
Calcium: 7.8 mg/dL — ABNORMAL LOW (ref 8.9–10.3)
Creatinine, Ser: 0.8 mg/dL (ref 0.44–1.00)
GFR calc Af Amer: >60 mL/min (ref >60)
Glucose, Bld: 111 mg/dL — ABNORMAL HIGH (ref 65–99)
POTASSIUM: 3.8 mmol/L (ref 3.5–5.1)
SODIUM: 137 mmol/L (ref 135–145)

## 2017-05-22 LAB — HEMOGLOBIN AND HEMATOCRIT, BLOOD
HCT: 27.7 % — ABNORMAL LOW (ref 36.0–46.0)
HCT: 26.8 % — ABNORMAL LOW (ref 36.0–46.0)
HEMOGLOBIN: 9 g/dL — AB (ref 12.0–15.0)
Hemoglobin: 9.5 g/dL — ABNORMAL LOW (ref 12.0–15.0)

## 2017-05-22 LAB — CBC
HEMATOCRIT: 33.9 % — AB (ref 36.0–46.0)
HEMATOCRIT: 29.7 % — AB (ref 36.0–46.0)
HEMATOCRIT: 28.5 % — AB (ref 36.0–46.0)
HEMOGLOBIN: 10.2 g/dL — AB (ref 12.0–15.0)
Hemoglobin: 11.3 g/dL — ABNORMAL LOW (ref 12.0–15.0)
Hemoglobin: 9.6 g/dL — ABNORMAL LOW (ref 12.0–15.0)
MCH: 30.8 pg (ref 26.0–34.0)
MCH: 31.9 pg (ref 26.0–34.0)
MCH: 31.4 pg (ref 26.0–34.0)
MCHC: 33.3 g/dL (ref 30.0–36.0)
MCHC: 34.3 g/dL (ref 30.0–36.0)
MCHC: 33.7 g/dL (ref 30.0–36.0)
MCV: 92.4 fL (ref 78.0–100.0)
MCV: 92.8 fL (ref 78.0–100.0)
MCV: 93.1 fL (ref 78.0–100.0)
PLATELETS: 149 10*3/uL — AB (ref 150–400)
Platelets: 223 10*3/uL (ref 150–400)
Platelets: 195 10*3/uL (ref 150–400)
RBC: 3.67 MIL/uL — ABNORMAL LOW (ref 3.87–5.11)
RBC: 3.2 MIL/uL — ABNORMAL LOW (ref 3.87–5.11)
RBC: 3.06 MIL/uL — ABNORMAL LOW (ref 3.87–5.11)
RDW: 12.8 % (ref 11.5–15.5)
RDW: 13 % (ref 11.5–15.5)
RDW: 13.1 % (ref 11.5–15.5)
WBC: 22.7 10*3/uL — ABNORMAL HIGH (ref 4.0–10.5)
WBC: 18.5 10*3/uL — ABNORMAL HIGH (ref 4.0–10.5)
WBC: 11.4 10*3/uL — ABNORMAL HIGH (ref 4.0–10.5)

## 2017-05-22 LAB — TYPE AND SCREEN
ABO/RH(D): AB POS
Antibody Screen: NEGATIVE

## 2017-05-22 LAB — FERRITIN: FERRITIN: 313 ng/mL — AB (ref 11–307)

## 2017-05-22 LAB — HIV ANTIBODY (ROUTINE TESTING W REFLEX): HIV SCREEN 4TH GENERATION: NONREACTIVE

## 2017-05-22 SURGERY — COLONOSCOPY WITH PROPOFOL
Anesthesia: Monitor Anesthesia Care

## 2017-05-22 MED ORDER — PROMETHAZINE HCL 25 MG/ML IJ SOLN: 6.2500 mg | INTRAMUSCULAR | Status: DC | PRN

## 2017-05-22 MED ORDER — PROPOFOL 10 MG/ML IV BOLUS
INTRAVENOUS | Status: AC
  Filled 2017-05-22: qty 60

## 2017-05-22 MED ORDER — GLYCOPYRROLATE 0.2 MG/ML IJ SOLN
INTRAMUSCULAR | Status: DC | PRN
  Administered 2017-05-22: 0.2 mg via INTRAVENOUS

## 2017-05-22 MED ORDER — LABETALOL HCL 5 MG/ML IV SOLN
INTRAVENOUS | Status: DC | PRN
  Administered 2017-05-22: 5 mg via INTRAVENOUS

## 2017-05-22 MED ORDER — LACTATED RINGERS IV SOLN: INTRAVENOUS | Status: DC

## 2017-05-22 MED ORDER — LIDOCAINE HCL (CARDIAC) 20 MG/ML IV SOLN
INTRAVENOUS | Status: DC | PRN
  Administered 2017-05-22: 100 mg via INTRAVENOUS

## 2017-05-22 MED ORDER — LIDOCAINE 2% (20 MG/ML) 5 ML SYRINGE
INTRAMUSCULAR | Status: AC
  Filled 2017-05-22: qty 5

## 2017-05-22 MED ORDER — PROPOFOL 500 MG/50ML IV EMUL
INTRAVENOUS | Status: DC | PRN
  Administered 2017-05-22: 300 ug/kg/min via INTRAVENOUS

## 2017-05-22 MED ORDER — LABETALOL HCL 5 MG/ML IV SOLN
INTRAVENOUS | Status: AC
  Filled 2017-05-22: qty 4

## 2017-05-22 MED ORDER — ONDANSETRON HCL 4 MG/2ML IJ SOLN
INTRAMUSCULAR | Status: DC | PRN
  Administered 2017-05-22: 4 mg via INTRAVENOUS

## 2017-05-22 MED ORDER — SODIUM CHLORIDE 0.9% FLUSH
3.0000 mL | Freq: Two times a day (BID) | INTRAVENOUS | Status: DC
  Administered 2017-05-22 (×2): 3 mL via INTRAVENOUS

## 2017-05-22 MED ORDER — LACTATED RINGERS IV SOLN
INTRAVENOUS | Status: DC | PRN
  Administered 2017-05-22: 10:00:00 via INTRAVENOUS

## 2017-05-22 MED ORDER — GLYCOPYRROLATE 0.2 MG/ML IV SOSY
PREFILLED_SYRINGE | INTRAVENOUS | Status: AC
  Filled 2017-05-22: qty 5

## 2017-05-22 MED ORDER — SODIUM CHLORIDE 0.9 % IV SOLN: 250.0000 mL | INTRAVENOUS | Status: DC | PRN

## 2017-05-22 MED ORDER — SODIUM CHLORIDE 0.9% FLUSH: 3.0000 mL | INTRAVENOUS | Status: DC | PRN

## 2017-05-22 SURGICAL SUPPLY — 22 items

## 2017-05-22 NOTE — Progress Notes (Signed)
Patient has left the floor.  Patient got on to the service elevator without telling this RN or the NT.  CN saw patient get on to the service elevator, followed her downstairs and sent security after her. Patient was gone for approximately 20 minutes.  Stated she went to get candy from the gift shop and got lost coming back.  Dr. Starla Link aware.  Patient educated that she is not to leave floor without an order from the doctor and is agreeable to this.  Will continue to monitor.

## 2017-05-22 NOTE — H&P (View-Only) (Signed)
Consultation  Referring Provider: [facility] / Dr Cathleen Fears Primary Care Physician:  Patient, No Pcp Per-discharged from  [facility] Primary care Primary Gastroenterologist:  None unassigned  Reason for Consultation:  Rectal bleeding  HPI: Donna Sullivan is a 51 y.o. female who presented to the emergency room today with acute onset of rectal bleeding. Patient has no previous GI history and has not had any previous GI evaluation. She does have history of apparent discoid lupus, migraine headaches, chronic kidney disease, and a chronic pain syndrome secondary to chronic lumbar back pain with radiculopathy. Patient says she was feeling fine yesterday, and felt fine this morning initially. About 10 AM she had urge for bowel movement and felt as if she was can have diarrhea. She said she had a bowel movement and passed a lot of red blood and clots and not any significant amount of stool. This was not associated with any significant abdominal pain and cramping diaphoresis etc. She then had 4 more similar episodes at home prior to coming to the emergency room. She describes them all is being read blood and clots. She has had a couple of more episodes in the emergency room and by her report the volume has not decreased as yet. She's had some mild lightheadedness denies any dizziness. Vitals have been stable since arrival to the ER. WBC 19.8, hemoglobin 13.9 hematocrit of 40.8. By reviewing previous labs her baseline hemoglobin is in the 15 range. BUN is normal, LFTs within normal limits. Previous CT of the abdomen and pelvis done in September 2017 did comment that she has diverticulosis and a nodular contour to her liver consistent with early cirrhosis. Also mentioned a 6 mm lesion in the right hepatic lobe favoring a benign vascular lesion but MRI was to be considered. Patient has no family history of colon cancer or polyps or inflammatory bowel disease. There is cirrhosis on her maternal side, most of it EtOH  related. Patient denies any regular EtOH use. She has been told in the past that she had a fatty liver.  She has been taking ibuprofen 800 mg 3 times daily fairly chronically also takes naproxen intermittently and has been taking Percocet recently for tooth pain. She also took a Fioricet yesterday from a family member for headache. Patient is status post appendectomy hysterectomy and cholecystectomy.   Past Medical History:  Diagnosis Date  . Anxiety   . Asthma   . Chronic kidney disease 1985   BLOCKED TUBE IN KIDNEY  . Discoid lupus    Dr Ronnald Ramp, dermatologist  . GERD (gastroesophageal reflux disease)   . Headache(784.0)    MIGRAINES  . Insomnia   . Seizures ([place])    EPILEPSY- NO SEIZURES SINCE AGE 2  . Tendonitis     Past Surgical History:  Procedure Laterality Date  . ABDOMINAL HYSTERECTOMY    . ANKLE RECONSTRUCTION  04/2009  . APPENDECTOMY    . CESAREAN SECTION    . CHOLECYSTECTOMY    . DIAGNOSTIC LAPAROSCOPY    . KIDNEY SURGERY     right, straightened tube  . LAPAROSCOPY  09/23/2012   Procedure: LAPAROSCOPY OPERATIVE;  Surgeon: Cheri Fowler, MD;  Location: [facility];  Service: Gynecology;  Laterality: N/A;  . OTHER SURGICAL HISTORY     Hysterectomy with 10cm grown removal, Dr. Willis Modena  . RIGHT OOPHORECTOMY      Prior to Admission medications   Medication Sig Start Date End Date Taking? Authorizing Provider  albuterol (PROVENTIL) (2.5 MG/3ML) 0.083% nebulizer solution Take  3 mLs (2.5 mg total) by nebulization every 6 (six) hours as needed for wheezing or shortness of breath. 01/17/17  Yes Carlota Raspberry, Tiffany, PA-C  albuterol (VENTOLIN HFA) 108 (90 Base) MCG/ACT inhaler INHALE 1-2 PUFFS INTO THE LUNGS EVERY 6 HOURS AS NEEDED FOR WHEEZING OR SHORTNESS OF BREATH 09/06/16   Midge Minium, MD  azithromycin (ZITHROMAX) 250 MG tablet Take 1 tablet (250 mg total) by mouth daily. Take first 2 tablets together, then 1 every day until finished. 01/17/17   Delos Haring, PA-C    benzonatate (TESSALON PERLES) 100 MG capsule Take 1 capsule (100 mg total) by mouth 3 (three) times daily as needed for cough. 01/17/17   Delos Haring, PA-C  cetirizine (ZYRTEC) 10 MG tablet Take 1 tablet (10 mg total) by mouth daily. 09/06/16   Midge Minium, MD  DULoxetine (CYMBALTA) 30 MG capsule Take 1 capsule (30 mg total) by mouth daily. 10/05/16   Midge Minium, MD  ibuprofen (ADVIL,MOTRIN) 800 MG tablet TAKE 1 TABLET BY MOUTH EVERY 8 HOURS AS NEEDED FOR MILD PAIN 10/01/16   Midge Minium, MD  LORazepam (ATIVAN) 0.5 MG tablet Take 1 tablet (0.5 mg total) by mouth 2 (two) times daily as needed. for anxiety 09/20/16   Midge Minium, MD  methocarbamol (ROBAXIN) 500 MG tablet Take 1 tablet (500 mg total) by mouth every 8 (eight) hours as needed for muscle spasms. 10/05/16   Midge Minium, MD  predniSONE (DELTASONE) 10 MG tablet Take 2 tablets (20 mg total) by mouth daily. 01/17/17   Delos Haring, PA-C  QVAR 40 MCG/ACT inhaler Inhale 2 puffs into the lungs 2 (two) times daily. 01/17/17   Delos Haring, PA-C    No current facility-administered medications for this encounter.    Current Outpatient Prescriptions  Medication Sig Dispense Refill  . albuterol (PROVENTIL) (2.5 MG/3ML) 0.083% nebulizer solution Take 3 mLs (2.5 mg total) by nebulization every 6 (six) hours as needed for wheezing or shortness of breath. 75 mL 12  . albuterol (VENTOLIN HFA) 108 (90 Base) MCG/ACT inhaler INHALE 1-2 PUFFS INTO THE LUNGS EVERY 6 HOURS AS NEEDED FOR WHEEZING OR SHORTNESS OF BREATH 18 g 6  . azithromycin (ZITHROMAX) 250 MG tablet Take 1 tablet (250 mg total) by mouth daily. Take first 2 tablets together, then 1 every day until finished. 6 tablet 0  . benzonatate (TESSALON PERLES) 100 MG capsule Take 1 capsule (100 mg total) by mouth 3 (three) times daily as needed for cough. 20 capsule 0  . cetirizine (ZYRTEC) 10 MG tablet Take 1 tablet (10 mg total) by mouth daily. 30 tablet 11   . DULoxetine (CYMBALTA) 30 MG capsule Take 1 capsule (30 mg total) by mouth daily. 30 capsule 1  . ibuprofen (ADVIL,MOTRIN) 800 MG tablet TAKE 1 TABLET BY MOUTH EVERY 8 HOURS AS NEEDED FOR MILD PAIN 30 tablet 1  . LORazepam (ATIVAN) 0.5 MG tablet Take 1 tablet (0.5 mg total) by mouth 2 (two) times daily as needed. for anxiety 30 tablet 0  . methocarbamol (ROBAXIN) 500 MG tablet Take 1 tablet (500 mg total) by mouth every 8 (eight) hours as needed for muscle spasms. 30 tablet 0  . predniSONE (DELTASONE) 10 MG tablet Take 2 tablets (20 mg total) by mouth daily. 15 tablet 0  . QVAR 40 MCG/ACT inhaler Inhale 2 puffs into the lungs 2 (two) times daily. 1 Inhaler 6    Allergies as of 05/21/2017 - Review Complete 05/21/2017  Allergen Reaction Noted  .  Symbicort [budesonide-formoterol fumarate] Other (See Comments) 04/21/2015  . Tramadol Shortness Of Breath 03/05/2014  . Amoxicillin-pot clavulanate  01/23/2011  . Bactrim [sulfamethoxazole-trimethoprim]  12/04/2012  . Ciprofloxacin hcl  09/11/2011  . Clindamycin/lincomycin Other (See Comments) 08/18/2015  . Doxycycline  05/13/2012  . Keflex [cephalexin]  12/04/2012  . Wellbutrin [bupropion]  11/14/2012    Family History  Problem Relation Age of Onset  . Coronary artery disease Mother   . Hypertension Mother   . Hypertension Father   . Colon cancer Unknown        grandfather    Social History   Social History  . Marital status: Married    Spouse name: N/A  . Number of children: N/A  . Years of education: N/A   Occupational History  . Not on file.   Social History Main Topics  . Smoking status: Current Every Day Smoker    Packs/day: 0.50    Types: Cigarettes  . Smokeless tobacco: Never Used     Comment: Maybe later  . Alcohol use Yes     Comment: occ  . Drug use: No  . Sexual activity: Not on file   Other Topics Concern  . Not on file   Social History Narrative   Lives with daughter.    ROS;  Pertinent positive and  negative review of systems were noted in the above HPI section.  All other review of systems was otherwise negative.  Physical Exam: Vital signs in last 24 hours: Temp:  [97.9 F (36.6 C)] 97.9 F (36.6 C) (05/22 1302) Pulse Rate:  [77-78] 77 (05/22 1440) Resp:  [15-17] 17 (05/22 1440) BP: (113-120)/(63-88) 116/65 (05/22 1440) SpO2:  [99 %-100 %] 100 % (05/22 1440) Weight:  [200 lb (90.7 kg)] 200 lb (90.7 kg) (05/22 1315)   General:   Alert,  Well-developed, well-nourished, white female ,pleasant and cooperative in NAD Head:  Normocephalic and atraumatic. Eyes:  Sclera clear, no icterus.   Conjunctiva pink. Ears:  Normal auditory acuity. Nose:  No deformity, discharge,  or lesions. Mouth:  No deformity or lesions.   Neck:  Supple; no masses or thyromegaly. Lungs:  Clear throughout to auscultation.   Scattered rhonchi bilaterally. Heart:  Regular rate and rhythm; no murmurs, clicks, rubs,  or gallops. Abdomen:  Soft, mild tenderness left lower quadrant, BS active,nonpalp mass or hsm.   Rectal:  Deferred -bright red blood and clots documented per ER Msk:  Symmetrical without gross deformities. . Pulses:  Normal pulses noted. Extremities:  Without clubbing or edema. Neurologic:  Alert and  oriented x4;  grossly normal neurologically. Skin:  Intact without significant lesions or rashes.. Psych:  Alert and cooperative. Normal mood and affect.  Intake/Output from previous day: No intake/output data recorded. Intake/Output this shift: No intake/output data recorded.  Lab Results:  Recent Labs  05/21/17 1411 05/21/17 1415  WBC 17.8*  --   HGB 13.9 14.3  HCT 40.8 42.0  PLT 245  --    BMET  Recent Labs  05/21/17 1411 05/21/17 1415  NA 138 141  K 4.1 4.2  CL 108 110  CO2 20*  --   GLUCOSE 146* 142*  BUN 10 10  CREATININE 0.81 0.70  CALCIUM 8.7*  --    LFT  Recent Labs  05/21/17 1411  PROT 7.4  ALBUMIN 3.8  AST 37  ALT 30  ALKPHOS 90  BILITOT 0.9    PT/INR No results for input(s): LABPROT, INR in the last 72 hours. Hepatitis Panel  No results for input(s): HEPBSAG, HCVAB, HEPAIGM, HEPBIGM in the last 72 hours.   IMPRESSION:   #26  51 year old white female with acute lower GI bleed of unclear etiology. Suspect this may be diverticular as has been painless. She has been taking fairly high dose NSAIDs on a regular basis which may also be contributory. #2 abnormal liver on previous CT-rule out cirrhosis #3 chronic pain syndrome/chronic lumbar back pain-with chronic NSAIDs and intermittent narcotic use #4 discoid lupus #5 status post appendectomy hysterectomy and cholecystectomy #6 leukocytosis    PLAN: #1 clear liquid diet, nothing by mouth after midnight #2 serial hemoglobins every 6 hours and transfuse for hemoglobin 8 or less #3 have scheduled for colonoscopy with Dr. Havery Moros in a.m. 05/22/2017. Procedure discussed in detail with the patient and she is agreeable to proceed. #4 bowel prep this evening #5 if patient has ongoing active bleeding and develops any hemodynamic instability-she may need nuclear medicine bleeding scan plus minus angiogram for embolization. This was discussed with hospitalist as well. #6 stop NSAID use #7 Will schedule for upper abdominal ultrasound tomorrow after her procedure. Thank you Will follow with you.   Amy Esterwood  05/21/2017, 3:02 PM

## 2017-05-22 NOTE — Transfer of Care (Signed)
Immediate Anesthesia Transfer of Care Note  Patient: Donna Sullivan  Procedure(s) Performed: Procedure(s): COLONOSCOPY WITH PROPOFOL (N/A)  Patient Location: PACU  Anesthesia Type:MAC  Level of Consciousness: awake, alert , oriented and patient cooperative  Airway & Oxygen Therapy: Patient Spontanous Breathing and Patient connected to face mask oxygen  Post-op Assessment: Report given to RN, Post -op Vital signs reviewed and stable and Patient moving all extremities X 4  Post vital signs: stable  Last Vitals:  Vitals:   05/22/17 0934 05/22/17 1043  BP: (!) 97/51   Pulse: 60 73  Resp: 18 18  Temp: 36.4 C 36.7 C    Last Pain:  Vitals:   05/22/17 1043  TempSrc: Oral  PainSc:       Patients Stated Pain Goal: 3 (70/35/00 9381)  Complications: No apparent anesthesia complications

## 2017-05-22 NOTE — Op Note (Signed)
[facility] Patient Name: Donna Sullivan Procedure Date: 05/22/2017 MRN: [id number] Attending MD: Carlota Raspberry. Armbruster MD, MD Date of Birth: 08-18-1966 CSN: [id number] Age: 51 Admit Type: Inpatient Procedure:                Colonoscopy Indications:              lower GI bleed, first colonoscopy Providers:                Carlota Raspberry. Armbruster MD, MD, Kingsley Plan, RN,                            William Dalton, Technician Referring MD:              Medicines:                Monitored Anesthesia Care Complications:            No immediate complications. Estimated blood loss:                            Minimal. Estimated Blood Loss:     Estimated blood loss was minimal. Procedure:                Pre-Anesthesia Assessment:                           - Prior to the procedure, a History and Physical                            was performed, and patient medications and                            allergies were reviewed. The patient's tolerance of                            previous anesthesia was also reviewed. The risks                            and benefits of the procedure and the sedation                            options and risks were discussed with the patient.                            All questions were answered, and informed consent                            was obtained. Prior Anticoagulants: The patient has                            taken no previous anticoagulant or antiplatelet                            agents. ASA Grade Assessment: III - A patient with  severe systemic disease. After reviewing the risks                            and benefits, the patient was deemed in                            satisfactory condition to undergo the procedure.                           After obtaining informed consent, the colonoscope                            was passed under direct vision. Throughout the                            procedure,  the patient's blood pressure, pulse, and                            oxygen saturations were monitored continuously. The                            EC-3890LI ([id number]) scope was introduced through                            the anus and advanced to the the terminal ileum,                            with identification of the appendiceal orifice and                            IC valve. The colonoscopy was performed without                            difficulty. The patient tolerated the procedure                            well. The quality of the bowel preparation was                            fair. The terminal ileum, ileocecal valve,                            appendiceal orifice, and rectum were photographed. Scope In: 10:04:30 AM Scope Out: 10:26:59 AM Scope Withdrawal Time: 0 hours 14 minutes 16 seconds  Total Procedure Duration: 0 hours 22 minutes 29 seconds  Findings:      The perianal and digital rectal examinations were normal.      Many medium-mouthed diverticula were found in the transverse colon and       left colon, highest burden in the left colon.      A large polyp was found in the distal sigmoid colon (few cm). The polyp       was pedunculated with a large thick stalk, and the head of it was       difficult to completely visualize. I elected  to remove the polyp given       it's size and potential for ulceration / bleeding. It was removed with a       hot snare. Resection and retrieval were complete. To prevent bleeding       after the polypectomy given the patient's recent bleeding, two       hemostatic clips were successfully placed across the base. There was no       bleeding during, or at the end, of the procedure.      Multiple sessile polyps were found in the rectum, sigmoid colon,       transverse colon and ascending colon. The polyps were medium in size.       They were not removed given no high risk stigmata for bleeding and the       patient's recent bleeding.       The terminal ileum appeared normal.      The bowel prep was fair. Time was taken to lavage the colon with fair       views. Small or flat polyps may not have been appreciated given the       prep. The exam was otherwise without abnormality. No blood in the colon,       brown stool noted. Impression:               - Preparation of the colon was fair.                           - Diverticulosis in the transverse colon and in the                            left colon - I suspect the patient more than likely                            had a diverticular bleed                           - One large polyp in the sigmoid colon, removed                            with a hot snare. Resected and retrieved. Clips                            were placed prophylactically.                           - Many medium polyps in the rectum, in the sigmoid                            colon, in the transverse colon and in the ascending                            colon - not removed today.                           - The examined portion of the ileum was normal.                           -  The examination was otherwise normal without                            blood noted.                           Overall, suspect the patient more than likely had a                            diverticular bleed, although I elected to remove                            the large sigmoid polyp given size and potential                            for bleeding, but think it less likely this was the                            cause for bleeding after it was removed and                            visualized better, did not appear obviously                            ulcerated. Small bowel bleeding source also                            possible but less likely. Moderate Sedation:      No moderate sedation, case performed with MAC Recommendation:           - Return patient to hospital ward for ongoing care.                           -  Advance diet as tolerated.                           - Monitor for recurrent bleeding (last bowel                            movement with blood 11 PM last night). Would like                            her to have no further bleeding for at least 24                            hours prior to discharge                           - Continue present medications.                           - NO NSAIDs                           - Await pathology results.                           -  Repeat colonoscopy for polypectomy in the                            upcoming 1-2 months given multiple polyps                            appreciated on this exam                           - Further recommendations per GI note (regarding                            workup for liver disease) Procedure Code(s):        --- Professional ---                           [phone], Colonoscopy, flexible; with removal of                            tumor(s), polyp(s), or other lesion(s) by snare                            technique Diagnosis Code(s):        --- Professional ---                           D12.5, Benign neoplasm of sigmoid colon                           K62.1, Rectal polyp                           D12.3, Benign neoplasm of transverse colon (hepatic                            flexure or splenic flexure)                           D12.2, Benign neoplasm of ascending colon                           K92.1, Melena (includes Hematochezia)                           K57.30, Diverticulosis of large intestine without                            perforation or abscess without bleeding CPT copyright 2016 American [employer]. All rights reserved. The codes documented in this report are preliminary and upon coder review may  be revised to meet current compliance requirements. Remo Lipps P. Armbruster MD, MD 05/22/2017 10:43:42 AM This report has been signed electronically. Number of Addenda: 0

## 2017-05-22 NOTE — Progress Notes (Signed)
Patient ID: Donna Sullivan, female   DOB: [birth year], 50 y.o.   MRN: [id number]  PROGRESS NOTE    Kennie Karapetian  GQQ:[id number] DOB: 22-Jun-1966 DOA: 05/21/2017 PCP: Patient, No Pcp Per   Brief Narrative:  51 y.o. female with medical history significant of chronic bronchitis, chronic back pain, history of discoid lupus who presented to the ED with hematochezia. Patient had colonoscopy done today.   Assessment & Plan:   Principal Problem:   Lower GI bleed Active Problems:   LUPUS ERYTHEMATOSUS, DISCOID   Depression   Right lumbar radiculopathy   COPD with chronic bronchitis (HCC)   Benign neoplasm of sigmoid colon  Lower GI bleeding:  - Probably diverticular bleeding. Status post colonoscopy today by gastroenterology showing diverticulosis in the transverse and left colon. One large polyp in the sigmoid colon was removed.  - Continue monitoring H and H.  - Start full liquid diet and advance as tolerated.  Chronic bronchitis:  continue her usual inhaler regimen.  History of chronic back pain: Usually takes NSAIDs-avoid NSAIDs due to active bleeding-have asked patient to try Tylenol first, if that doesn't help then we'll provide as needed narcotics.  History of discoid lupus:  outpatient follow-up  Depression: Continue Cymbalta.  Leukocytosis: Probably reactive. Improving. Repeat a.m. labs    DVT prophylaxis: SCDs Code Status:  Full Family Communication: Discussed with family present at bedside Disposition Plan: Probable discharge home tomorrow  Consultants: Gastroenterology  Procedures:  Colonoscopy 05/22/2017:  - Diverticulosis in the transverse colon and in the                            left colon - I suspect the patient more than likely                            had a diverticular bleed                           - One large polyp in the sigmoid colon, removed                            with a hot snare. Resected and retrieved. Clips                            were  placed prophylactically.                           - Many medium polyps in the rectum, in the sigmoid                            colon, in the transverse colon and in the ascending                            colon - not removed today.                           - The examined portion of the ileum was normal.                           - The examination was otherwise  normal without                            blood noted.                           Overall, suspect the patient more than likely had a                            diverticular bleed, although I elected to remove                            the large sigmoid polyp given size and potential                            for bleeding, but think it less likely this was the                            cause for bleeding after it was removed and                            visualized better, did not appear obviously                            ulcerated. Small bowel bleeding source also                            possible but less likely.  Antimicrobials: None Subjective: Patient seen and examined at bedside. She just returned from colonoscopy. No overnight fever, nausea, vomiting.  Objective: Vitals:   05/22/17 1100 05/22/17 1107 05/22/17 1118 05/22/17 1318  BP: (!) 114/54 (!) 118/53 (!) 107/46   Pulse: 71  72   Resp: 16  18   Temp:   97.9 F (36.6 C) 98.3 F (36.8 C)  TempSrc:   Oral Oral  SpO2: 97%  100%   Weight:      Height:        Intake/Output Summary (Last 24 hours) at 05/22/17 1439 Last data filed at 05/22/17 1423  Gross per 24 hour  Intake          1831.25 ml  Output                0 ml  Net          1831.25 ml   Filed Weights   05/21/17 1315 05/21/17 1700  Weight: 90.7 kg (200 lb) 91.3 kg (201 lb 4.5 oz)    Examination:  General exam: Appears calm and comfortable  Respiratory system: Bilateral decreased breath sound at bases Cardiovascular system: S1 & S2 heard,Rate controlled  Gastrointestinal system: Abdomen  is nondistended, soft and nontender. Normal bowel sounds heard. Extremities: No cyanosis, clubbing, edema    Data Reviewed: I have personally reviewed following labs and imaging studies  CBC:  Recent Labs Lab 05/21/17 1411  05/21/17 1734 05/21/17 2204 05/22/17 0111 05/22/17 0344 05/22/17 1236  WBC 17.8*  --  18.1*  --  22.7* 18.5* 11.4*  HGB 13.9  < > 12.2 11.9* 11.3* 10.2* 9.6*  HCT 40.8  < > 36.1 35.5* 33.9* 29.7* 28.5*  MCV 92.5  --  92.3  --  92.4 92.8 93.1  PLT 245  --  208  --  223 195 149*  < > = values in this interval not displayed. Basic Metabolic Panel:  Recent Labs Lab 05/21/17 1411 05/21/17 1415 05/22/17 0344  NA 138 141 137  K 4.1 4.2 3.8  CL 108 110 111  CO2 20*  --  18*  GLUCOSE 146* 142* 111*  BUN 10 10 15   CREATININE 0.81 0.70 0.80  CALCIUM 8.7*  --  7.8*   GFR: Estimated Creatinine Clearance: 86.6 mL/min (by C-G formula based on SCr of 0.8 mg/dL). Liver Function Tests:  Recent Labs Lab 05/21/17 1411  AST 37  ALT 30  ALKPHOS 90  BILITOT 0.9  PROT 7.4  ALBUMIN 3.8   No results for input(s): LIPASE, AMYLASE in the last 168 hours. No results for input(s): AMMONIA in the last 168 hours. Coagulation Profile:  Recent Labs Lab 05/22/17 0344  INR 1.26   Cardiac Enzymes: No results for input(s): CKTOTAL, CKMB, CKMBINDEX, TROPONINI in the last 168 hours. BNP (last 3 results) No results for input(s): PROBNP in the last 8760 hours. HbA1C: No results for input(s): HGBA1C in the last 72 hours. CBG: No results for input(s): GLUCAP in the last 168 hours. Lipid Profile: No results for input(s): CHOL, HDL, LDLCALC, TRIG, CHOLHDL, LDLDIRECT in the last 72 hours. Thyroid Function Tests: No results for input(s): TSH, T4TOTAL, FREET4, T3FREE, THYROIDAB in the last 72 hours. Anemia Panel:  Recent Labs  05/22/17 1236  FERRITIN 313*  TIBC 235*  IRON 100   Sepsis Labs: No results for input(s): PROCALCITON, LATICACIDVEN in the last 168  hours.  No results found for this or any previous visit (from the past 240 hour(s)).       Radiology Studies: US Abdomen Complete  Result Date: 05/21/2017 CLINICAL DATA:  Cirrhosis. EXAM: ABDOMEN ULTRASOUND COMPLETE COMPARISON:  CT abdomen and pelvis 09/11/2016 FINDINGS: Gallbladder: Surgically absent. Common bile duct: Diameter: 3 mm Liver: Coarse, echogenic liver parenchyma diffusely without focal abnormality identified. IVC: No abnormality visualized. Pancreas: Visualized portion unremarkable. Poor visualization of the tail due to bowel gas. Spleen: Size and appearance within normal limits. Right Kidney: Length: 11.9 cm. Echogenicity within normal limits. No mass or hydronephrosis visualized. Left Kidney: Length: 10.6 cm. Echogenicity within normal limits. No mass or hydronephrosis visualized. Abdominal aorta: No aneurysm visualized. Other findings: None. IMPRESSION: 1. Coarse, echogenic liver compatible with provided history of cirrhosis. No focal abnormality identified. 2. Prior cholecystectomy.  No biliary dilatation. Electronically Signed   By: Logan Bores M.D.   On: 05/21/2017 17:49        Scheduled Meds: . budesonide (PULMICORT) nebulizer solution  0.25 mg Nebulization BID  . DULoxetine  30 mg Oral Daily  . pantoprazole  40 mg Oral Q0600  . sodium chloride flush  3 mL Intravenous Q12H  . sodium chloride flush  3 mL Intravenous Q12H   Continuous Infusions: . sodium chloride    . lactated ringers       LOS: 1 day        Aline August, MD [facility] Pager [phone]  If 7PM-7AM, please contact night-coverage [email or web address] Password [facility] 05/22/2017, 2:39 PM

## 2017-05-22 NOTE — Interval H&P Note (Signed)
History and Physical Interval Note:  05/22/2017 9:57 AM  Donna Sullivan  has presented today for surgery, with the diagnosis of GI bleed  The various methods of treatment have been discussed with the patient and family. After consideration of risks, benefits and other options for treatment, the patient has consented to  Procedure(s): COLONOSCOPY WITH PROPOFOL (N/A) as a surgical intervention .  The patient's history has been reviewed, patient examined, no change in status, stable for surgery.  I have reviewed the patient's chart and labs.  Questions were answered to the patient's satisfaction.     Renelda Loma Armbruster

## 2017-05-22 NOTE — Progress Notes (Signed)
GI UPDATE:  Patient had colonoscopy today. No active bleeding or blood noted. Diverticulosis noted, I suspect this is the likely etiology. She did have a large sigmoid polyp I elected to remove, see procedure note for details, as well as multiple other polyps that were not removed on this exam  Also noted to have possible cirrhosis on [place].   Recommend the following: - liquid diet and advance as tolerated today - would ensure no bleeding for 24 hours prior to consideration for discharge, if no bleeding throughout today can be discharged tomorrow morning - leukocytosis noted, no obvious evidence of diverticulitis on CT scan, would trend - given US findings, recommend lab workup for chronic liver diseases, we can obtain those while inpatient and she can follow up with [place] as outpatient for this. Platelets normal, not convinced she has cirrhosis but possible, if she has it, is mild.  - needs tobacco cessation, and sleep apnea evaluation per anesthesia recommendations given her observed breathing during anesthesia today  Please call with questions.  Yamhill Cellar, MD [facility] Pager [phone]

## 2017-05-22 NOTE — Anesthesia Preprocedure Evaluation (Signed)
Anesthesia Evaluation  Patient identified by MRN, date of birth, ID band Patient awake    Reviewed: Allergy & Precautions, NPO status , Patient's Chart, lab work & pertinent test results  Airway Mallampati: II  TM Distance: >3 FB Neck ROM: Full    Dental no notable dental hx.    Pulmonary COPD, Current Smoker,    Pulmonary exam normal breath sounds clear to auscultation       Cardiovascular negative cardio ROS Normal cardiovascular exam Rhythm:Regular Rate:Normal     Neuro/Psych negative neurological ROS  negative psych ROS   GI/Hepatic negative GI ROS, Neg liver ROS,   Endo/Other  obesity  Renal/GU negative Renal ROS  negative genitourinary   Musculoskeletal negative musculoskeletal ROS (+)   Abdominal   Peds negative pediatric ROS (+)  Hematology negative hematology ROS (+)   Anesthesia Other Findings   Reproductive/Obstetrics negative OB ROS                             Anesthesia Physical Anesthesia Plan  ASA: II  Anesthesia Plan: MAC   Post-op Pain Management:    Induction: Intravenous  Airway Management Planned: Nasal Cannula  Additional Equipment:   Intra-op Plan:   Post-operative Plan: Extubation in OR  Informed Consent: I have reviewed the patients History and Physical, chart, labs and discussed the procedure including the risks, benefits and alternatives for the proposed anesthesia with the patient or authorized representative who has indicated his/her understanding and acceptance.   Dental advisory given  Plan Discussed with: CRNA and Surgeon  Anesthesia Plan Comments:         Anesthesia Quick Evaluation

## 2017-05-22 NOTE — ED Provider Notes (Signed)
[facility] DEPT Provider Note   CSN: [id number] Arrival date & time: 05/21/17  1255     History   Chief Complaint Chief Complaint  Patient presents with  . Rectal Bleeding    HPI Donna Sullivan is a 51 y.o. female.  Patient complains of rectal bleeding. She had 4 episodes of rectal bleeding before coming to the emergency department   The history is provided by the patient.  Rectal Bleeding  Quality:  Bright red Amount:  Moderate Timing:  Constant Context: not anal fissures   Similar prior episodes: no   Relieved by:  Nothing Worsened by:  Nothing Associated symptoms: no abdominal pain     Past Medical History:  Diagnosis Date  . Anxiety   . Asthma   . Chronic kidney disease 1985   BLOCKED TUBE IN KIDNEY  . Discoid lupus    Dr Ronnald Ramp, dermatologist  . GERD (gastroesophageal reflux disease)   . Headache(784.0)    MIGRAINES  . Insomnia   . Seizures ([place])    EPILEPSY- NO SEIZURES SINCE AGE 58  . Tendonitis     Patient Active Problem List   Diagnosis Date Noted  . Benign neoplasm of sigmoid colon   . Lower GI bleed 05/21/2017  . Insomnia 09/06/2016  . COPD with chronic bronchitis ([place]) 09/06/2016  . Tension headache 03/02/2016  . Right lumbar radiculopathy 03/02/2016  . Acute bacterial sinusitis 12/04/2014  . Pelvic mass in female 03/18/2014  . Hematuria, microscopic 03/05/2014  . Left flank pain 06/08/2013  . Routine general medical examination at a health care facility 04/10/2013  . Chest pain, atypical 04/10/2013  . LLQ pain 04/10/2013  . Trapezius muscle spasm 03/29/2013  . Hair loss 11/14/2012  . Depression 11/14/2012  . Pelvic pain in female 09/23/2012  . Fecal incontinence 05/13/2012  . Seasonal allergic rhinitis 05/13/2012  . Panic anxiety syndrome 05/13/2012  . Stress incontinence, female 11/06/2011  . ROM 05/22/2010  . ANKLE PAIN, LEFT 03/09/2009  . LUPUS ERYTHEMATOSUS, DISCOID 01/26/2009    Past Surgical History:  Procedure  Laterality Date  . ABDOMINAL HYSTERECTOMY    . ANKLE RECONSTRUCTION  04/2009  . APPENDECTOMY    . CESAREAN SECTION    . CHOLECYSTECTOMY    . DIAGNOSTIC LAPAROSCOPY    . KIDNEY SURGERY     right, straightened tube  . LAPAROSCOPY  09/23/2012   Procedure: LAPAROSCOPY OPERATIVE;  Surgeon: Cheri Fowler, MD;  Location: [facility];  Service: Gynecology;  Laterality: N/A;  . OTHER SURGICAL HISTORY     Hysterectomy with 10cm grown removal, Dr. Willis Modena  . RIGHT OOPHORECTOMY      OB History    Gravida Para Term Preterm AB Living   _0 SAB TAB Ectopic Multiple Live Births                   Home Medications    Prior to Admission medications   Medication Sig Start Date End Date Taking? Authorizing Provider  DULoxetine (CYMBALTA) 30 MG capsule Take 1 capsule (30 mg total) by mouth daily. 10/05/16  Yes Midge Minium, MD  QVAR 40 MCG/ACT inhaler Inhale 2 puffs into the lungs 2 (two) times daily. 01/17/17  Yes Carlota Raspberry, Tiffany, PA-C  albuterol (PROVENTIL) (2.5 MG/3ML) 0.083% nebulizer solution Take 3 mLs (2.5 mg total) by nebulization every 6 (six) hours as needed for wheezing or shortness of breath. Patient not taking: Reported on 05/21/2017 01/17/17   Delos Haring, PA-C  azithromycin (ZITHROMAX) 250 MG tablet Take 1 tablet (250 mg total) by mouth daily. Take first 2 tablets together, then 1 every day until finished. Patient not taking: Reported on 05/21/2017 01/17/17   Delos Haring, PA-C  benzonatate (TESSALON PERLES) 100 MG capsule Take 1 capsule (100 mg total) by mouth 3 (three) times daily as needed for cough. Patient not taking: Reported on 05/21/2017 01/17/17   Delos Haring, PA-C  predniSONE (DELTASONE) 10 MG tablet Take 2 tablets (20 mg total) by mouth daily. Patient not taking: Reported on 05/21/2017 01/17/17   Delos Haring, PA-C    Family History Family History  Problem Relation Age of Onset  . Coronary artery disease Mother   . Hypertension Mother   .  Hypertension Father   . Colon cancer Unknown        grandfather    Social History Social History  Substance Use Topics  . Smoking status: Current Every Day Smoker    Packs/day: 0.50    Types: Cigarettes  . Smokeless tobacco: Never Used     Comment: Maybe later  . Alcohol use Yes     Comment: occ     Allergies   Symbicort [budesonide-formoterol fumarate]; Tramadol; Amoxicillin-pot clavulanate; Bactrim [sulfamethoxazole-trimethoprim]; Ciprofloxacin hcl; Clindamycin/lincomycin; Cymbalta [duloxetine hcl]; Doxycycline; Keflex [cephalexin]; and Wellbutrin [bupropion]   Review of Systems Review of Systems  Constitutional: Negative for appetite change and fatigue.  HENT: Negative for congestion, ear discharge and sinus pressure.   Eyes: Negative for discharge.  Respiratory: Negative for cough.   Cardiovascular: Negative for chest pain.  Gastrointestinal: Positive for hematochezia. Negative for abdominal pain and diarrhea.       Rectal bleeding  Genitourinary: Negative for frequency and hematuria.  Musculoskeletal: Negative for back pain.  Skin: Negative for rash.  Neurological: Negative for seizures and headaches.  Psychiatric/Behavioral: Negative for hallucinations.     Physical Exam Updated Vital Signs BP (!) 97/51   Pulse 73   Temp 98.1 F (36.7 C) (Oral)   Resp 18   Ht _0  (1.549 m)   Wt 91.3 kg (201 lb 4.5 oz)   SpO2 100%   BMI 38.03 kg/m   Physical Exam  Constitutional: She is oriented to person, place, and time. She appears well-developed.  HENT:  Head: Normocephalic.  Eyes: Conjunctivae and EOM are normal. No scleral icterus.  Neck: Neck supple. No thyromegaly present.  Cardiovascular: Normal rate and regular rhythm.  Exam reveals no gallop and no friction rub.   No murmur heard. Pulmonary/Chest: No stridor. She has no wheezes. She has no rales. She exhibits no tenderness.  Abdominal: She exhibits no distension. There is no tenderness. There is no  rebound.  Genitourinary:  Genitourinary Comments: Rectal exam heme positive right red stool  Musculoskeletal: Normal range of motion. She exhibits no edema.  Lymphadenopathy:    She has no cervical adenopathy.  Neurological: She is oriented to person, place, and time. She exhibits normal muscle tone. Coordination normal.  Skin: No rash noted. No erythema.  Psychiatric: She has a normal mood and affect. Her behavior is normal.     ED Treatments / Results  Labs (all labs ordered are listed, but only abnormal results are displayed) Labs Reviewed  COMPREHENSIVE METABOLIC PANEL - Abnormal; Notable for the following:       Result Value   CO2 20 (*)    Glucose, Bld 146 (*)    Calcium 8.7 (*)    All other components within normal limits  CBC - Abnormal;  Notable for the following:    WBC 17.8 (*)    All other components within normal limits  HEMOGLOBIN AND HEMATOCRIT, BLOOD - Abnormal; Notable for the following:    Hemoglobin 11.9 (*)    HCT 35.5 (*)    All other components within normal limits  PROTIME-INR - Abnormal; Notable for the following:    Prothrombin Time 15.9 (*)    All other components within normal limits  BASIC METABOLIC PANEL - Abnormal; Notable for the following:    CO2 18 (*)    Glucose, Bld 111 (*)    Calcium 7.8 (*)    All other components within normal limits  CBC - Abnormal; Notable for the following:    WBC 18.5 (*)    RBC 3.20 (*)    Hemoglobin 10.2 (*)    HCT 29.7 (*)    All other components within normal limits  CBC - Abnormal; Notable for the following:    WBC 18.1 (*)    All other components within normal limits  CBC - Abnormal; Notable for the following:    WBC 22.7 (*)    RBC 3.67 (*)    Hemoglobin 11.3 (*)    HCT 33.9 (*)    All other components within normal limits  POC OCCULT BLOOD, ED - Abnormal; Notable for the following:    Fecal Occult Bld POSITIVE (*)    All other components within normal limits  I-STAT CHEM 8, ED - Abnormal; Notable  for the following:    Glucose, Bld 142 (*)    All other components within normal limits  HIV ANTIBODY (ROUTINE TESTING)  CBC  HEMOGLOBIN AND HEMATOCRIT, BLOOD  HEMOGLOBIN AND HEMATOCRIT, BLOOD  HEMOGLOBIN AND HEMATOCRIT, BLOOD  IRON AND TIBC  FERRITIN  CERULOPLASMIN  ALPHA-1-ANTITRYPSIN  ANA W/REFLEX IF POSITIVE  IGG  ANTI-SMOOTH MUSCLE ANTIBODY, IGG  HEPATITIS B SURFACE ANTIGEN  HEPATITIS C ANTIBODY  HEPATITIS B SURFACE ANTIBODY  HEPATITIS A ANTIBODY, TOTAL  TYPE AND SCREEN  ABO/RH  SURGICAL PATHOLOGY    EKG  EKG Interpretation None       Radiology US Abdomen Complete  Result Date: 05/21/2017 CLINICAL DATA:  Cirrhosis. EXAM: ABDOMEN ULTRASOUND COMPLETE COMPARISON:  CT abdomen and pelvis 09/11/2016 FINDINGS: Gallbladder: Surgically absent. Common bile duct: Diameter: 3 mm Liver: Coarse, echogenic liver parenchyma diffusely without focal abnormality identified. IVC: No abnormality visualized. Pancreas: Visualized portion unremarkable. Poor visualization of the tail due to bowel gas. Spleen: Size and appearance within normal limits. Right Kidney: Length: 11.9 cm. Echogenicity within normal limits. No mass or hydronephrosis visualized. Left Kidney: Length: 10.6 cm. Echogenicity within normal limits. No mass or hydronephrosis visualized. Abdominal aorta: No aneurysm visualized. Other findings: None. IMPRESSION: 1. Coarse, echogenic liver compatible with provided history of cirrhosis. No focal abnormality identified. 2. Prior cholecystectomy.  No biliary dilatation. Electronically Signed   By: Logan Bores M.D.   On: 05/21/2017 17:49    Procedures Procedures (including critical care time)  Medications Ordered in ED Medications  ondansetron (ZOFRAN) tablet 4 mg ( Oral MAR Hold 05/22/17 0926)    Or  ondansetron (ZOFRAN) injection 4 mg ( Intravenous MAR Hold 05/22/17 0926)  albuterol (PROVENTIL) (2.5 MG/3ML) 0.083% nebulizer solution 2.5 mg ( Nebulization MAR Hold 05/22/17 0926)    sodium chloride flush (NS) 0.9 % injection 3 mL ( Intravenous Automatically Held 06/06/17 2200)  0.9 %  sodium chloride infusion ( Intravenous Paused 05/22/17 0900)  acetaminophen (TYLENOL) tablet 650 mg ( Oral MAR Hold 05/22/17 0926)  Or  acetaminophen (TYLENOL) suppository 650 mg ( Rectal MAR Hold 05/22/17 0926)  DULoxetine (CYMBALTA) DR capsule 30 mg ( Oral Automatically Held 06/06/17 1000)  pantoprazole (PROTONIX) EC tablet 40 mg ( Oral Automatically Held 06/06/17 0600)  budesonide (PULMICORT) nebulizer solution 0.25 mg ( Nebulization Automatically Held 06/06/17 2000)  oxyCODONE (Oxy IR/ROXICODONE) immediate release tablet 5 mg ( Oral MAR Hold 05/22/17 0926)  promethazine (PHENERGAN) injection 6.25-12.5 mg (not administered)  lactated ringers infusion (not administered)  sodium chloride 0.9 % bolus 1,000 mL (0 mLs Intravenous Stopped 05/21/17 1610)  peg 3350 powder (MOVIPREP) kit 100 g (100 g Oral Given 05/21/17 1824)    And  peg 3350 powder (MOVIPREP) kit 100 g (100 g Oral Given 05/21/17 2134)     Initial Impression / Assessment and Plan / ED Course  I have reviewed the triage vital signs and the nursing notes.  Pertinent labs & imaging results that were available during my care of the patient were reviewed by me and considered in my medical decision making (see chart for details).     CRITICAL CARE Performed by: Laveah Gloster L Total critical care time: 40 minutes Critical care time was exclusive of separately billable procedures and treating other patients. Critical care was necessary to treat or prevent imminent or life-threatening deterioration. Critical care was time spent personally by me on the following activities: development of treatment plan with patient and/or surrogate as well as nursing, discussions with consultants, evaluation of patient's response to treatment, examination of patient, obtaining history from patient or surrogate, ordering and performing treatments and  interventions, ordering and review of laboratory studies, ordering and review of radiographic studies, pulse oximetry and re-evaluation of patient's condition.   Final Clinical Impressions(s) / ED Diagnoses   Final diagnoses:  Cirrhosis ([place])  Lower GI bleed    New Prescriptions Current Discharge Medication List    Patient had 5 more episodes of rectal bleeding she was in emergency department. She is going to be seen by GI and admitted by medicine patient given IV fluids in emergency department   Milton Ferguson, MD 05/22/17 1057

## 2017-05-22 NOTE — Anesthesia Postprocedure Evaluation (Signed)
Anesthesia Post Note  Patient: Donna Sullivan  Procedure(s) Performed: Procedure(s) (LRB): COLONOSCOPY WITH PROPOFOL (N/A)  Patient location during evaluation: PACU Anesthesia Type: MAC Level of consciousness: awake and alert Pain management: pain level controlled Vital Signs Assessment: post-procedure vital signs reviewed and stable Respiratory status: spontaneous breathing, nonlabored ventilation, respiratory function stable and patient connected to nasal cannula oxygen Cardiovascular status: stable and blood pressure returned to baseline Anesthetic complications: no       Last Vitals:  Vitals:   05/22/17 0934 05/22/17 1043  BP: (!) 97/51   Pulse: 60 73  Resp: 18 18  Temp: 36.4 C 36.4 C    Last Pain:  Vitals:   05/22/17 1043  TempSrc: Oral  PainSc:                  [name] S

## 2017-05-23 ENCOUNTER — Encounter (HOSPITAL_COMMUNITY): Payer: Self-pay | Admitting: Gastroenterology

## 2017-05-23 DIAGNOSIS — K635 Polyp of colon: Secondary | ICD-10-CM | POA: Diagnosis not present

## 2017-05-23 DIAGNOSIS — K7460 Unspecified cirrhosis of liver: Secondary | ICD-10-CM | POA: Diagnosis not present

## 2017-05-23 DIAGNOSIS — K922 Gastrointestinal hemorrhage, unspecified: Secondary | ICD-10-CM | POA: Diagnosis not present

## 2017-05-23 LAB — BASIC METABOLIC PANEL
Anion gap: 6 (ref 5–15)
BUN: 9 mg/dL (ref 6–20)
CHLORIDE: 107 mmol/L (ref 101–111)
CO2: 25 mmol/L (ref 22–32)
CREATININE: 0.8 mg/dL (ref 0.44–1.00)
Calcium: 8.4 mg/dL — ABNORMAL LOW (ref 8.9–10.3)
GFR calc Af Amer: >60 mL/min (ref >60)
GFR calc non Af Amer: >60 mL/min (ref >60)
GLUCOSE: 144 mg/dL — AB (ref 65–99)
POTASSIUM: 3.7 mmol/L (ref 3.5–5.1)
Sodium: 138 mmol/L (ref 135–145)

## 2017-05-23 LAB — CBC WITH DIFFERENTIAL/PLATELET
Basophils Absolute: 0 10*3/uL (ref 0.0–0.1)
Basophils Relative: 0 %
Eosinophils Absolute: 0.6 10*3/uL (ref 0.0–0.7)
Eosinophils Relative: 5 %
HEMATOCRIT: 24.5 % — AB (ref 36.0–46.0)
HEMOGLOBIN: 8.4 g/dL — AB (ref 12.0–15.0)
LYMPHS ABS: 5 10*3/uL — AB (ref 0.7–4.0)
LYMPHS PCT: 45 %
MCH: 31.9 pg (ref 26.0–34.0)
MCHC: 34.3 g/dL (ref 30.0–36.0)
MCV: 93.2 fL (ref 78.0–100.0)
MONOS PCT: 6 %
Monocytes Absolute: 0.7 10*3/uL (ref 0.1–1.0)
NEUTROS ABS: 4.9 10*3/uL (ref 1.7–7.7)
NEUTROS PCT: 44 %
Platelets: 124 10*3/uL — ABNORMAL LOW (ref 150–400)
RBC: 2.63 MIL/uL — ABNORMAL LOW (ref 3.87–5.11)
RDW: 13 % (ref 11.5–15.5)
WBC: 11.2 10*3/uL — ABNORMAL HIGH (ref 4.0–10.5)

## 2017-05-23 LAB — HEPATITIS B SURFACE ANTIGEN: Hepatitis B Surface Ag: NEGATIVE

## 2017-05-23 LAB — HEMOGLOBIN AND HEMATOCRIT, BLOOD
HCT: 24.7 % — ABNORMAL LOW (ref 36.0–46.0)
Hemoglobin: 8.5 g/dL — ABNORMAL LOW (ref 12.0–15.0)

## 2017-05-23 NOTE — Discharge Summary (Signed)
Physician Discharge Summary  Donna Sullivan WJX:[id number] DOB: April 29, 1966 DOA: 05/21/2017  PCP: Patient, No Pcp Per  Admit date: 05/21/2017 Discharge date: 05/23/2017  Admitted From: Home Disposition: Home  Recommendations for Outpatient Follow-up:  1. Follow up with PCP in 1-2 weeks 2. Please obtain BMP/CBC in one week 3. Follow-up with gastroenterology/Dr. Havery Moros in 1-2 weeks  Home Health: No Equipment/Devices: None  Discharge Condition: Stable CODE STATUS: Full Diet recommendation: Heart Healthy  Brief/Interim Summary: 51 y.o.femalewith medical history significant of chronic bronchitis, chronic back pain, history of discoid lupus who presented to the ED with hematochezia. Patient had colonoscopy done on 05/22/2017. She is not actively bleeding anymore. She stable for discharge  Discharge Diagnoses:  Principal Problem:   Lower GI bleed Active Problems:   LUPUS ERYTHEMATOSUS, DISCOID   Depression   Right lumbar radiculopathy   COPD with chronic bronchitis (HCC)   Benign neoplasm of sigmoid colon  Acute blood loss anemia: From lower GI bleeding. Repeat hemoglobin in a week's time  Lower GI bleeding: - Probably diverticular bleeding. Status post colonoscopy on 05/22/2017 by gastroenterology showing diverticulosis in the transverse and left colon. One large polyp in the sigmoid colon was removed  -No further episodes of rectal bleeding since the procedure. Hemoglobin has dropped but is stable. Patient will be discharged today with outpatient follow-up with gastroenterology.  Chronic bronchitis: continue her usual inhaler regimen.  History of chronic back pain: Outpatient follow-up  History of discoid lupus: outpatient follow-up  Depression:Continue Cymbalta.  Leukocytosis: Probably reactive. Improving.   Probable diagnosis of cirrhosis as seen on ultrasound/CAT scan of the abdomen: Outpatient follow-up with gastroenterology.   Discharge  Instructions  Discharge Instructions    Call MD for:  difficulty breathing, headache or visual disturbances    Complete by:  As directed    Call MD for:  extreme fatigue    Complete by:  As directed    Call MD for:  persistant dizziness or light-headedness    Complete by:  As directed    Call MD for:  persistant nausea and vomiting    Complete by:  As directed    Call MD for:  severe uncontrolled pain    Complete by:  As directed    Call MD for:  temperature >100.4    Complete by:  As directed    Diet - low sodium heart healthy    Complete by:  As directed    Increase activity slowly    Complete by:  As directed      Allergies as of 05/23/2017      Reactions   Symbicort [budesonide-formoterol Fumarate] Other (See Comments)   Caused open sores in mouth    Tramadol Shortness Of Breath   Amoxicillin-pot Clavulanate    REACTION: dizziness, fatigue/drowsiness and nausea   Bactrim [sulfamethoxazole-trimethoprim]    Blister in mouth   Ciprofloxacin Hcl    Blisters in mouth   Clindamycin/lincomycin Other (See Comments)   Sores in mouth    Cymbalta [duloxetine Hcl] Other (See Comments)   Per pt makes her "jacked up"   Doxycycline    Fatigue, sleepy, worse   Keflex [cephalexin]    Blister in mouth   Wellbutrin [bupropion]    Blisters in mouth      Medication List    STOP taking these medications   azithromycin 250 MG tablet Commonly known as:  ZITHROMAX   benzonatate 100 MG capsule Commonly known as:  TESSALON PERLES   predniSONE 10 MG tablet Commonly known  as:  DELTASONE     TAKE these medications   albuterol (2.5 MG/3ML) 0.083% nebulizer solution Commonly known as:  PROVENTIL Take 3 mLs (2.5 mg total) by nebulization every 6 (six) hours as needed for wheezing or shortness of breath.   DULoxetine 30 MG capsule Commonly known as:  CYMBALTA Take 1 capsule (30 mg total) by mouth daily.   QVAR 40 MCG/ACT inhaler Generic drug:  beclomethasone Inhale 2 puffs into  the lungs 2 (two) times daily.      Follow-up Information    Armbruster, Renelda Loma, MD Follow up in 2 week(s).   Specialty:  Gastroenterology Contact information: [address] [place] [address] [phone]          Allergies  Allergen Reactions  . Symbicort [Budesonide-Formoterol Fumarate] Other (See Comments)    Caused open sores in mouth   . Tramadol Shortness Of Breath  . Amoxicillin-Pot Clavulanate     REACTION: dizziness, fatigue/drowsiness and nausea  . Bactrim [Sulfamethoxazole-Trimethoprim]     Blister in mouth  . Ciprofloxacin Hcl     Blisters in mouth  . Clindamycin/Lincomycin Other (See Comments)    Sores in mouth   . Cymbalta [Duloxetine Hcl] Other (See Comments)    Per pt makes her "jacked up"  . Doxycycline     Fatigue, sleepy, worse   . Keflex [Cephalexin]     Blister in mouth  . Wellbutrin [Bupropion]     Blisters in mouth    Consultations: Gastroenterology/Dr. Havery Moros Procedures/Studies: US Abdomen Complete  Result Date: 05/21/2017 CLINICAL DATA:  Cirrhosis. EXAM: ABDOMEN ULTRASOUND COMPLETE COMPARISON:  CT abdomen and pelvis 09/11/2016 FINDINGS: Gallbladder: Surgically absent. Common bile duct: Diameter: 3 mm Liver: Coarse, echogenic liver parenchyma diffusely without focal abnormality identified. IVC: No abnormality visualized. Pancreas: Visualized portion unremarkable. Poor visualization of the tail due to bowel gas. Spleen: Size and appearance within normal limits. Right Kidney: Length: 11.9 cm. Echogenicity within normal limits. No mass or hydronephrosis visualized. Left Kidney: Length: 10.6 cm. Echogenicity within normal limits. No mass or hydronephrosis visualized. Abdominal aorta: No aneurysm visualized. Other findings: None. IMPRESSION: 1. Coarse, echogenic liver compatible with provided history of cirrhosis. No focal abnormality identified. 2. Prior cholecystectomy.  No biliary dilatation. Electronically Signed   By: Logan Bores M.D.   On: 05/21/2017 17:49    Colonoscopy 05/22/2017: - Diverticulosis in the transverse colon and in the  left colon - I suspect the patient more than likely  had a diverticular bleed - One large polyp in the sigmoid colon, removed  with a hot snare. Resected and retrieved. Clips  were placed prophylactically. - Many medium polyps in the rectum, in the sigmoid  colon, in the transverse colon and in the ascending  colon - not removed today. - The examined portion of the ileum was normal. - The examination was otherwise normal without  blood noted. Overall, suspect the patient more than likely had a  diverticular bleed, although I elected to remove  the large sigmoid polyp given size and potential  for bleeding, but think it less likely this was the  cause for bleeding after it was removed and  visualized better, did not appear obviously  ulcerated. Small bowel bleeding source also  possible but less likely.   Subjective: Patient is seen and examined at bedside. She feels weak. She denies any overnight rectal bleeding, nausea or vomiting.  Discharge Exam: Vitals:   05/22/17 2053 05/23/17 0440  BP: 116/63 110/64  Pulse:  83 70  Resp: 18 18  Temp: 99.3 F (37.4 C) 99.2 F (37.3 C)   Vitals:   05/22/17 2053 05/23/17 0440 05/23/17 0909 05/23/17 0912  BP: 116/63 110/64    Pulse: 83 70    Resp: 18 18    Temp: 99.3 F (37.4 C) 99.2 F (37.3 C)    TempSrc: Oral Oral    SpO2: 99% 97% 99%  99%  Weight:      Height:        General: Pt is alert, awake, not in acute distress Cardiovascular: RRR, Rate controlled  Respiratory: Bilateral decreased breath sounds at bases Abdominal: Soft, NT, ND, bowel sounds + Extremities: no edema, no cyanosis    The results of significant diagnostics from this hospitalization (including imaging, microbiology, ancillary and laboratory) are listed below for reference.     Microbiology: No results found for this or any previous visit (from the past 240 hour(s)).   Labs: BNP (last 3 results) No results for input(s): BNP in the last 8760 hours. Basic Metabolic Panel:  Recent Labs Lab 05/21/17 1411 05/21/17 1415 05/22/17 0344 05/23/17 0411  NA 138 141 137 138  K 4.1 4.2 3.8 3.7  CL 108 110 111 107  CO2 20*  --  18* 25  GLUCOSE 146* 142* 111* 144*  BUN 10 10 15 9   CREATININE 0.81 0.70 0.80 0.80  CALCIUM 8.7*  --  7.8* 8.4*   Liver Function Tests:  Recent Labs Lab 05/21/17 1411  AST 37  ALT 30  ALKPHOS 90  BILITOT 0.9  PROT 7.4  ALBUMIN 3.8   No results for input(s): LIPASE, AMYLASE in the last 168 hours. No results for input(s): AMMONIA in the last 168 hours. CBC:  Recent Labs Lab 05/21/17 1734  05/22/17 0111 05/22/17 0344 05/22/17 1236 05/22/17 1540 05/22/17 2219 05/23/17 0411  WBC 18.1*  --  22.7* 18.5* 11.4*  --   --  11.2*  NEUTROABS  --   --   --   --   --   --   --  4.9  HGB 12.2  < > 11.3* 10.2* 9.6* 9.5* 9.0* 8.5*  8.4*  HCT 36.1  < > 33.9* 29.7* 28.5* 27.7* 26.8* 24.7*  24.5*  MCV 92.3  --  92.4 92.8 93.1  --   --  93.2  PLT 208  --  223 195 149*  --   --  124*  < > = values in this interval not displayed. Cardiac Enzymes: No results for input(s): CKTOTAL, CKMB, CKMBINDEX, TROPONINI in the last 168 hours. BNP: Invalid input(s): POCBNP CBG: No results for input(s): GLUCAP in the last 168 hours. D-Dimer No results for input(s): DDIMER in the last 72 hours. Hgb A1c No results for input(s):  HGBA1C in the last 72 hours. Lipid Profile No results for input(s): CHOL, HDL, LDLCALC, TRIG, CHOLHDL, LDLDIRECT in the last 72 hours. Thyroid function studies No results for input(s): TSH, T4TOTAL, T3FREE, THYROIDAB in the last 72 hours.  Invalid input(s): FREET3 Anemia work up  Recent Labs  05/22/17 1236  FERRITIN 313*  TIBC 235*  IRON 100   Urinalysis    Component Value Date/Time   COLORURINE YELLOW 07/13/2014 1417   APPEARANCEUR CLOUDY (A) 07/13/2014 1417   LABSPEC 1.010 07/13/2014 1417   PHURINE 5.5 07/13/2014 1417   GLUCOSEU NEGATIVE 07/13/2014 1417   HGBUR TRACE (A) 07/13/2014 1417   HGBUR negative 01/22/2011 1545   BILIRUBINUR negative 09/06/2016 [place] 07/13/2014  [facility] 09/06/2016 1059   PROTEINUR NEGATIVE 07/13/2014 1417   UROBILINOGEN 1.0 09/06/2016 1059   UROBILINOGEN 0.2 07/13/2014 1417   NITRITE negative 09/06/2016 1059   NITRITE NEGATIVE 07/13/2014 1417   LEUKOCYTESUR Negative 09/06/2016 1059   Sepsis Labs Invalid input(s): PROCALCITONIN,  WBC,  LACTICIDVEN Microbiology No results found for this or any previous visit (from the past 240 hour(s)).   Time coordinating discharge: 35 minutes  SIGNED:   Aline August, MD  [facility] 05/23/2017, 9:56 AM Pager: [phone]  If 7PM-7AM, please contact night-coverage [email or web address] Password [email or web address]

## 2017-05-23 NOTE — Progress Notes (Signed)
Appointment [facility]th[facility] at 9:30 AM. Pt agreed.

## 2017-05-23 NOTE — Progress Notes (Signed)
Progress Note   Subjective  Patient did well overnight. She has had no further bowel movements last night or today. No abdominal pain. Hgb drifted down but she denies any further bleeding. She complains of ongoing low back pain.   Objective   Vital signs in last 24 hours: Temp:  [97.9 F (36.6 C)-99.3 F (37.4 C)] 99.2 F (37.3 C) (05/24 0440) Pulse Rate:  [70-83] 70 (05/24 0440) Resp:  [18] 18 (05/24 0440) BP: (107-118)/(46-64) 110/64 (05/24 0440) SpO2:  [97 %-100 %] 99 % (05/24 0912) Last BM Date: 05/22/17 General:    white female in NAD Heart:  Regular rate and rhythm; no murmurs Lungs: Respirations even and unlabored, lungs CTA bilaterally Abdomen:  Soft, nontender and nondistended. Normal bowel sounds. Extremities:  Without edema. Neurologic:  Alert and oriented,  grossly normal neurologically. Psych:  Cooperative. Normal mood and affect.  Intake/Output from previous day: 05/23 0701 - 05/24 0700 In: 957.5 [P.O.:100; I.V.:857.5] Out: -  Intake/Output this shift: Total I/O In: 600 [P.O.:600] Out: -   Lab Results:  Recent Labs  05/22/17 0344 05/22/17 1236 05/22/17 1540 05/22/17 2219 05/23/17 0411  WBC 18.5* 11.4*  --   --  11.2*  HGB 10.2* 9.6* 9.5* 9.0* 8.5*  8.4*  HCT 29.7* 28.5* 27.7* 26.8* 24.7*  24.5*  PLT 195 149*  --   --  124*   BMET  Recent Labs  05/21/17 1411 05/21/17 1415 05/22/17 0344 05/23/17 0411  NA 138 141 137 138  K 4.1 4.2 3.8 3.7  CL 108 110 111 107  CO2 20*  --  18* 25  GLUCOSE 146* 142* 111* 144*  BUN 10 10 15 9   CREATININE 0.81 0.70 0.80 0.80  CALCIUM 8.7*  --  7.8* 8.4*   LFT  Recent Labs  05/21/17 1411  PROT 7.4  ALBUMIN 3.8  AST 37  ALT 30  ALKPHOS 90  BILITOT 0.9   PT/INR  Recent Labs  05/22/17 0344  LABPROT 15.9*  INR 1.26    Studies/Results: US Abdomen Complete  Result Date: 05/21/2017 CLINICAL DATA:  Cirrhosis. EXAM: ABDOMEN ULTRASOUND COMPLETE COMPARISON:  CT abdomen and pelvis  09/11/2016 FINDINGS: Gallbladder: Surgically absent. Common bile duct: Diameter: 3 mm Liver: Coarse, echogenic liver parenchyma diffusely without focal abnormality identified. IVC: No abnormality visualized. Pancreas: Visualized portion unremarkable. Poor visualization of the tail due to bowel gas. Spleen: Size and appearance within normal limits. Right Kidney: Length: 11.9 cm. Echogenicity within normal limits. No mass or hydronephrosis visualized. Left Kidney: Length: 10.6 cm. Echogenicity within normal limits. No mass or hydronephrosis visualized. Abdominal aorta: No aneurysm visualized. Other findings: None. IMPRESSION: 1. Coarse, echogenic liver compatible with provided history of cirrhosis. No focal abnormality identified. 2. Prior cholecystectomy.  No biliary dilatation. Electronically Signed   By: Logan Bores M.D.   On: 05/21/2017 17:49       Assessment / Plan:   51 y/o female admitted with lower GI bleed. Colonoscopy showed diverticulosis as the likely etiology, also numerous polyps noted. One large pedunculated polyp removed given it had potential for bleeding and difficult to visualize the head of it entirely, once removed it did not appear ulcerated or to be the cause - prophylactic hemostasis clips placed.  Tolerating a regular diet overnight, no further bowel movements. Hgb has downtrended however likely dilutional. Okay for discharge home today. She can follow up with [place] for a repeat colonoscopy in one month for polypectomy of other polyps noted on her  exam.   She should avoid all NSAIDs given polypectomy of large polyp and recent bleeding. She needs to establish with a new primary care / pain management for long term back pain - I think low dose narcotic may be reasonable to avoid NSAID use at this time, defer to primary provider.  Of note, patient also was found to have imaging suspicious for cirrhosis. Labs for chronic liver diseases ordered and pending, if she has cirrhosis it appears  compensated. Suspect related to fatty liver disease. She will see me in clinic for further assessment of this issue.   Please call with questions.  [place] Cellar, MD [facility] Pager [phone]

## 2017-05-23 NOTE — Discharge Instructions (Signed)
Gastrointestinal Bleeding °Gastrointestinal bleeding is bleeding somewhere along the path food travels through the body (digestive tract). This path is anywhere between the mouth and the opening of the butt (anus). You may have blood in your poop (stools) or have black poop. If you throw up (vomit), there may be blood in it. °This condition can be mild, serious, or even life-threatening. If you have a lot of bleeding, you may need to stay in the hospital. °Follow these instructions at home: °· Take over-the-counter and prescription medicines only as told by your doctor. °· Eat foods that have a lot of fiber in them. These foods include whole grains, fruits, and vegetables. You can also try eating 1-3 prunes each day. °· Drink enough fluid to keep your pee (urine) clear or pale yellow. °· Keep all follow-up visits as told by your doctor. This is important. °Contact a doctor if: °· Your symptoms do not get better. °Get help right away if: °· Your bleeding gets worse. °· You feel dizzy or you pass out (faint). °· You feel weak. °· You have very bad cramps in your back or belly (abdomen). °· You pass large clumps of blood (clots) in your poop. °· Your symptoms are getting worse. °This information is not intended to replace advice given to you by your health care provider. Make sure you discuss any questions you have with your health care provider. °Document Released: 09/25/2008 Document Revised: 05/24/2016 Document Reviewed: 06/06/2015 °Elsevier Interactive Patient Education © 2017 Elsevier Inc. ° °

## 2017-05-24 ENCOUNTER — Other Ambulatory Visit: Payer: Self-pay

## 2017-05-24 ENCOUNTER — Telehealth: Payer: Self-pay | Admitting: Gastroenterology

## 2017-05-24 LAB — HEPATITIS B SURFACE ANTIBODY,QUALITATIVE: HEP B S AB: NONREACTIVE

## 2017-05-24 LAB — ANA W/REFLEX IF POSITIVE: ANA: NEGATIVE

## 2017-05-24 LAB — ANTI-SMOOTH MUSCLE ANTIBODY, IGG: F-Actin IgG: 23 Units — ABNORMAL HIGH (ref 0–19)

## 2017-05-24 LAB — HEPATITIS A ANTIBODY, TOTAL: HEP A TOTAL AB: NEGATIVE

## 2017-05-24 LAB — CERULOPLASMIN: Ceruloplasmin: 19.2 mg/dL (ref 19.0–39.0)

## 2017-05-24 LAB — HEPATITIS C ANTIBODY: HCV Ab: <0.1 s/co ratio (ref 0.0–0.9)

## 2017-05-24 LAB — IGG: IGG (IMMUNOGLOBIN G), SERUM: 866 mg/dL (ref 700–1600)

## 2017-05-24 LAB — ALPHA-1-ANTITRYPSIN: A-1 Antitrypsin, Ser: 144 mg/dL (ref 90–200)

## 2017-05-24 MED ORDER — OXYCODONE HCL 5 MG PO TABS: 5.0000 mg | ORAL_TABLET | Freq: Three times a day (TID) | ORAL | 0 refills | Status: DC | PRN

## 2017-05-24 NOTE — Telephone Encounter (Signed)
Spoke to Dr, Havery Moros, he okayed #20 of the oxycodone. He is aware patient has an appointment with new PCP on 6/4. Patient will pick up Rx here at office.

## 2017-05-24 NOTE — Telephone Encounter (Signed)
Left message for patient to call back  

## 2017-05-24 NOTE — Telephone Encounter (Signed)
Patient has chronic back pain.  She needs to establish with a new PCM for long term care, I think she also would warrant evaluation by pain management service. She can take tylenol, limit to 2 gm / day, but it is safe for her. If this can control it, she does not need anything else She should avoid NSAIDs completely I had asked her discharging hospitalist to give her low dose narcotic for back pain. If she has this she can take it.  If she did not get this upon discharge, we can give her oxycodone IR 5mg  po q 8 hours for pain. #10 RF0 I will not provide any more pain medication for this issue, she needs to contact / establish with a new primary care or pain management for long term pain control. Thanks

## 2017-05-24 NOTE — Telephone Encounter (Signed)
Spoke to patient, she has taken ibuprofen 800 mg daily for chronic back pain. Since she cannot have any NSAIDs, advised not to take Tylenol, allergy to tramadol, what can she take for the pain. Denies abdominal pain or rectal bleeding. Please advise.

## 2017-05-28 ENCOUNTER — Telehealth: Payer: Self-pay | Admitting: Gastroenterology

## 2017-05-28 ENCOUNTER — Other Ambulatory Visit: Payer: Self-pay

## 2017-05-30 ENCOUNTER — Ambulatory Visit: Payer: 59 | Admitting: Family Medicine

## 2017-05-31 ENCOUNTER — Telehealth: Payer: Self-pay | Admitting: Gastroenterology

## 2017-05-31 ENCOUNTER — Other Ambulatory Visit: Payer: Self-pay

## 2017-05-31 DIAGNOSIS — R197 Diarrhea, unspecified: Secondary | ICD-10-CM

## 2017-05-31 MED ORDER — ONDANSETRON HCL 4 MG PO TABS: 4.0000 mg | ORAL_TABLET | Freq: Three times a day (TID) | ORAL | 0 refills | Status: DC | PRN

## 2017-05-31 NOTE — Telephone Encounter (Signed)
Left detailed message for patient to try imodium, if symptoms worsen she needs to submit stool sample on Monday. Orders in [place]. Patient called back, she understands all directions. Rx sent to her pharmacy.

## 2017-05-31 NOTE — Telephone Encounter (Signed)
She was recently hospitalized and at risk for C diff - if severe symptoms or fevers or worsening with immodium she needs to have stool sample checked for C diff. We can give her some zofran otherwise if she needs it. If you can place the order for C diff testing, if her symptoms persist she should submit a sample on Monday. Thanks

## 2017-05-31 NOTE — Telephone Encounter (Signed)
Patient states that she has been having nausea and diarrhea since Tuesday. She denies fever, vomiting or abdominal pain. She has been able to keep fluids down. She has not tried imodium.

## 2017-06-03 ENCOUNTER — Telehealth: Payer: Self-pay | Admitting: Gastroenterology

## 2017-06-03 ENCOUNTER — Encounter: Payer: Self-pay | Admitting: Family Medicine

## 2017-06-03 ENCOUNTER — Ambulatory Visit (INDEPENDENT_AMBULATORY_CARE_PROVIDER_SITE_OTHER): Payer: 59 | Admitting: Family Medicine

## 2017-06-03 VITALS — BP 125/70 | HR 74 | Temp 98.0°F | Resp 16 | Ht 61.0 in | Wt 197.0 lb

## 2017-06-03 DIAGNOSIS — R109 Unspecified abdominal pain: Secondary | ICD-10-CM

## 2017-06-03 LAB — POCT URINALYSIS DIP (DEVICE)
Bilirubin Urine: NEGATIVE
GLUCOSE, UA: NEGATIVE mg/dL
KETONES UR: NEGATIVE mg/dL
Leukocytes, UA: NEGATIVE
Nitrite: NEGATIVE
PROTEIN: NEGATIVE mg/dL
SPECIFIC GRAVITY, URINE: 1.01 (ref 1.005–1.030)
Urobilinogen, UA: 0.2 mg/dL (ref 0.0–1.0)
pH: 5.5 (ref 5.0–8.0)

## 2017-06-03 LAB — CBC WITH DIFFERENTIAL/PLATELET
BASOS ABS: 110 {cells}/uL (ref 0–200)
Basophils Relative: 1 %
EOS ABS: 880 {cells}/uL — AB (ref 15–500)
Eosinophils Relative: 8 %
HEMATOCRIT: 33.7 % — AB (ref 35.0–45.0)
Hemoglobin: 11.1 g/dL — ABNORMAL LOW (ref 11.7–15.5)
Lymphocytes Relative: 35 %
Lymphs Abs: 3850 cells/uL (ref 850–3900)
MCH: 30.9 pg (ref 27.0–33.0)
MCHC: 32.9 g/dL (ref 32.0–36.0)
MCV: 93.9 fL (ref 80.0–100.0)
MONO ABS: 770 {cells}/uL (ref 200–950)
MONOS PCT: 7 %
MPV: 9.8 fL (ref 7.5–12.5)
NEUTROS ABS: 5390 {cells}/uL (ref 1500–7800)
Neutrophils Relative %: 49 %
PLATELETS: 222 10*3/uL (ref 140–400)
RBC: 3.59 MIL/uL — ABNORMAL LOW (ref 3.80–5.10)
RDW: 13.7 % (ref 11.0–15.0)
WBC: 11 10*3/uL — ABNORMAL HIGH (ref 3.8–10.8)

## 2017-06-03 NOTE — Telephone Encounter (Signed)
[place], Rx for zofran is still there. Called [facility] and they will call pharmacy to get Rx transferred.

## 2017-06-03 NOTE — Progress Notes (Signed)
Patient ID: Donna Sullivan, female    DOB: 1966-07-23, 51 y.o.   MRN: [id number]  PCP: Patient, No Pcp Per  Chief Complaint  Patient presents with  . Establish Care  . Hospitalization Follow-up     Subjective:  HPI  Donna Sullivan is a 51 y.o. female presents for a hospital follow-up and to establish care. However, Donna Sullivan reports that she has an upcoming appointment at [facility] to establish care and  Thought she is here today to obtain controlled pain medication refill.  Hospital Follow-up Patient persists today  following a hospital admission 05/21/2017 for lower GI Bleed. Gastroenterology was consulted and patient underwent colonoscopy which resulted in removal large  polyp from sigmoid colon and diverticulosis in transverse left colon was noted to possibly be the source  of bleeding. Complaining of constant abdominal pain and continual diarrhea since hospital discharge. With food pain worsens. She is eating bland foods, reports persistent left sided pain. She has not returned to  work, due to persistent pain. Donna Sullivan has history of chronic pain medication and benzodiazapine use  "see FYI" attached to EMR. Donna Sullivan has a follow-up with GI scheduled for 06/05/2017.    Social History   Social History  . Marital status: Married    Spouse name: N/A  . Number of children: N/A  . Years of education: N/A   Occupational History  . Not on file.   Social History Main Topics  . Smoking status: Current Every Day Smoker    Packs/day: 0.50    Types: Cigarettes  . Smokeless tobacco: Never Used     Comment: Maybe later  . Alcohol use Yes     Comment: occ  . Drug use: No  . Sexual activity: Not on file   Other Topics Concern  . Not on file   Social History Narrative   Lives with daughter.    Family History  Problem Relation Age of Onset  . Coronary artery disease Mother   . Hypertension Mother   . Hypertension Father   . Colon cancer Unknown        grandfather   Review of  Systems  See HPI    Patient Active Problem List   Diagnosis Date Noted  . Cirrhosis of liver without ascites ([place])   . Benign neoplasm of sigmoid colon   . Lower GI bleed 05/21/2017  . Insomnia 09/06/2016  . COPD with chronic bronchitis ([place]) 09/06/2016  . Tension headache 03/02/2016  . Right lumbar radiculopathy 03/02/2016  . Acute bacterial sinusitis 12/04/2014  . Pelvic mass in female 03/18/2014  . Hematuria, microscopic 03/05/2014  . Left flank pain 06/08/2013  . Routine general medical examination at a health care facility 04/10/2013  . Chest pain, atypical 04/10/2013  . LLQ pain 04/10/2013  . Trapezius muscle spasm 03/29/2013  . Hair loss 11/14/2012  . Depression 11/14/2012  . Pelvic pain in female 09/23/2012  . Fecal incontinence 05/13/2012  . Seasonal allergic rhinitis 05/13/2012  . Panic anxiety syndrome 05/13/2012  . Stress incontinence, female 11/06/2011  . ROM 05/22/2010  . ANKLE PAIN, LEFT 03/09/2009  . LUPUS ERYTHEMATOSUS, DISCOID 01/26/2009      Prior to Admission medications   Medication Sig Start Date End Date Taking? Authorizing Provider  albuterol (PROVENTIL) (2.5 MG/3ML) 0.083% nebulizer solution Take 3 mLs (2.5 mg total) by nebulization every 6 (six) hours as needed for wheezing or shortness of breath. Patient not taking: Reported on 05/21/2017 01/17/17   Delos Haring, PA-C  DULoxetine (CYMBALTA) 30  MG capsule Take 1 capsule (30 mg total) by mouth daily. 10/05/16   Midge Minium, MD  ondansetron (ZOFRAN) 4 MG tablet Take 1 tablet (4 mg total) by mouth every 8 (eight) hours as needed for nausea or vomiting. 05/31/17   Armbruster, Renelda Loma, MD  oxyCODONE (OXY IR/ROXICODONE) 5 MG immediate release tablet Take 1 tablet (5 mg total) by mouth every 8 (eight) hours as needed for severe pain. 05/24/17   Armbruster, Renelda Loma, MD  QVAR 40 MCG/ACT inhaler Inhale 2 puffs into the lungs 2 (two) times daily. 01/17/17   Delos Haring, PA-C    Past  Medical, Surgical Family and Social History reviewed and updated.    Objective:  There were no vitals filed for this visit.  Wt Readings from Last 3 Encounters:  05/21/17 201 lb 4.5 oz (91.3 kg)  01/17/17 200 lb (90.7 kg)  10/05/16 202 lb (91.6 kg)    Physical Exam  Constitutional: She is oriented to person, place, and time. She appears well-developed and well-nourished.  HENT:  Head: Normocephalic and atraumatic.  Eyes: Pupils are equal, round, and reactive to light.  Neck: Normal range of motion. Neck supple.  Cardiovascular: Normal rate, regular rhythm, normal heart sounds and intact distal pulses.   Abdominal: Soft. Bowel sounds are normal. She exhibits no shifting dullness and no distension. There is no hepatosplenomegaly. There is tenderness in the left upper quadrant and left lower quadrant. There is positive Murphy's sign. There is no rigidity, no rebound, no guarding, no CVA tenderness and no tenderness at McBurney's point.  Musculoskeletal: Normal range of motion.  Neurological: She is alert and oriented to person, place, and time.  Skin: Skin is warm and dry.  Psychiatric: Judgment and thought content normal. Her mood appears anxious. Cognition and memory are normal.    Assessment & Plan:  1. Left sided abdominal pain - COMPLETE METABOLIC PANEL WITH GFR - CBC with Differential - Amylase - Lipase -Patient given collection containers for stool sample needed to screen for C-Difficile  Lab pending in EPIC per GI.  [name], was very insistent that she needed pain medication for her symptoms. In review of chart, patient has a  History of chronic pain management with opioid medications with the last FYI dated  01/2716. There is no indication as to the reason patient was treated with controlled medication. I opted out of giving her pain medication as she will not be my patient of record and is unwilling to try  Non-opioid medication options. Will defer pain management to GI and  new PCP.   RTC: No follow-up on file   Carroll Sage. Kenton Kingfisher, MSN, FNP-C [facility]  [address] [place], [place] [address] [phone]

## 2017-06-03 NOTE — Anesthesia Postprocedure Evaluation (Signed)
Anesthesia Post Note  Patient: Donna Sullivan  Procedure(s) Performed: Procedure(s) (LRB): COLONOSCOPY WITH PROPOFOL (N/A)     Anesthesia Post Evaluation  Last Vitals:  Vitals:   05/22/17 2053 05/23/17 0440  BP: 116/63 110/64  Pulse: 83 70  Resp: 18 18  Temp: 37.4 C 37.3 C    Last Pain:  Vitals:   05/23/17 0830  TempSrc:   PainSc: 0-No pain                 Qunicy Higinbotham S

## 2017-06-03 NOTE — Patient Instructions (Addendum)
Please follow-up and schedule an appointment with [facility] as soon as possible to establish primary care.       Diverticulosis Diverticulosis is a condition that develops when small pouches (diverticula) form in the wall of the large intestine (colon). The colon is where water is absorbed and stool is formed. The pouches form when the inside layer of the colon pushes through weak spots in the outer layers of the colon. You may have a few pouches or many of them. What are the causes? The cause of this condition is not known. What increases the risk? The following factors may make you more likely to develop this condition:  Being older than age 60. Your risk for this condition increases with age. Diverticulosis is rare among people younger than age 76. By age 70, many people have it.  Eating a low-fiber diet.  Having frequent constipation.  Being overweight.  Not getting enough exercise.  Smoking.  Taking over-the-counter pain medicines, like aspirin and ibuprofen.  Having a family history of diverticulosis.  What are the signs or symptoms? In most people, there are no symptoms of this condition. If you do have symptoms, they may include:  Bloating.  Cramps in the abdomen.  Constipation or diarrhea.  Pain in the lower left side of the abdomen.  How is this diagnosed? This condition is most often diagnosed during an exam for other colon problems. Because diverticulosis usually has no symptoms, it often cannot be diagnosed independently. This condition may be diagnosed by:  Using a flexible scope to examine the colon (colonoscopy).  Taking an X-ray of the colon after dye has been put into the colon (barium enema).  Doing a CT scan.  How is this treated? You may not need treatment for this condition if you have never developed an infection related to diverticulosis. If you have had an infection before, treatment may include:  Eating a high-fiber diet. This may include  eating more fruits, vegetables, and grains.  Taking a fiber supplement.  Taking a live bacteria supplement (probiotic).  Taking medicine to relax your colon.  Taking antibiotic medicines.  Follow these instructions at home:  Drink 6-8 glasses of water or more each day to prevent constipation.  Try not to strain when you have a bowel movement.  If you have had an infection before: ? Eat more fiber as directed by your health care provider or your diet and nutrition specialist (dietitian). ? Take a fiber supplement or probiotic, if your health care provider approves.  Take over-the-counter and prescription medicines only as told by your health care provider.  If you were prescribed an antibiotic, take it as told by your health care provider. Do not stop taking the antibiotic even if you start to feel better.  Keep all follow-up visits as told by your health care provider. This is important. Contact a health care provider if:  You have pain in your abdomen.  You have bloating.  You have cramps.  You have not had a bowel movement in 3 days. Get help right away if:  Your pain gets worse.  Your bloating becomes very bad.  You have a fever or chills, and your symptoms suddenly get worse.  You vomit.  You have bowel movements that are bloody or black.  You have bleeding from your rectum. Summary  Diverticulosis is a condition that develops when small pouches (diverticula) form in the wall of the large intestine (colon).  You may have a few pouches or many  of them.  This condition is most often diagnosed during an exam for other colon problems.  If you have had an infection related to diverticulosis, treatment may include increasing the fiber in your diet, taking supplements, or taking medicines. This information is not intended to replace advice given to you by your health care provider. Make sure you discuss any questions you have with your health care  provider. Document Released: 09/13/2004 Document Revised: 11/05/2016 Document Reviewed: 11/05/2016 Elsevier Interactive Patient Education  2017 [employer]. q

## 2017-06-03 NOTE — Addendum Note (Signed)
Addendum  created 06/03/17 1446 by Myrtie Soman, MD   Sign clinical note

## 2017-06-04 ENCOUNTER — Telehealth: Payer: Self-pay | Admitting: Gastroenterology

## 2017-06-04 LAB — COMPLETE METABOLIC PANEL WITH GFR
ALT: 18 U/L (ref 6–29)
AST: 29 U/L (ref 10–35)
Albumin: 3.8 g/dL (ref 3.6–5.1)
Alkaline Phosphatase: 91 U/L (ref 33–130)
BILIRUBIN TOTAL: 0.5 mg/dL (ref 0.2–1.2)
BUN: 8 mg/dL (ref 7–25)
CHLORIDE: 110 mmol/L (ref 98–110)
CO2: 19 mmol/L — AB (ref 20–31)
Calcium: 8.9 mg/dL (ref 8.6–10.4)
Creat: 0.7 mg/dL (ref 0.50–1.05)
GFR, Est African American: >89 mL/min (ref >=60)
Glucose, Bld: 142 mg/dL — ABNORMAL HIGH (ref 65–99)
Potassium: 3.8 mmol/L (ref 3.5–5.3)
SODIUM: 140 mmol/L (ref 135–146)
TOTAL PROTEIN: 6.9 g/dL (ref 6.1–8.1)

## 2017-06-04 LAB — AMYLASE: Amylase: 29 U/L (ref 21–101)

## 2017-06-04 LAB — LIPASE: Lipase: 35 U/L (ref 7–60)

## 2017-06-04 NOTE — Telephone Encounter (Signed)
Patient had been instructed to not take the imodium in order to get a proper specimen. She has the containers and will get the specimen tonight. She has a pre-visit appointment tomorrow a.m.

## 2017-06-04 NOTE — Telephone Encounter (Signed)
Okay thanks for the update. Blood work looks okay outside of mild elevation in WBC. She needs a stool study for C Diff which I had recommended the last time she called in. Looks like it was ordered but she did not complete it. Given her recent hospitalization can you ask her to submit C diff specimen, she should hold off on immodium until this is back. Thanks

## 2017-06-04 NOTE — Telephone Encounter (Signed)
Patient called she is still having diarrhea, nausea. She admits that when she takes the imodium BID this helps, and encouraged her to take the Zofran for her nausea. Patient saw a NP yesterday that was not willing to give her any pain medication. Patient has an appointment with PCP on 6/15 to establish care. Patient has one day left of pain medication, would like to know if she can take Tylenol. She had labs drawn yesterday, they are in [place].

## 2017-06-05 ENCOUNTER — Ambulatory Visit (AMBULATORY_SURGERY_CENTER): Payer: Self-pay | Admitting: *Deleted

## 2017-06-05 VITALS — Ht 61.0 in | Wt 196.0 lb

## 2017-06-05 DIAGNOSIS — Z86010 Personal history of colonic polyps: Secondary | ICD-10-CM

## 2017-06-05 MED ORDER — NA SULFATE-K SULFATE-MG SULF 17.5-3.13-1.6 GM/177ML PO SOLN: ORAL | 0 refills | Status: DC

## 2017-06-05 NOTE — Progress Notes (Signed)
Patient upset and crying, due to "insurance", patient handed my insurance information she received from mail that states they believe the colonoscopy that was recently done at hospital was not necessary and they may not pay. Copy made of this paper work and given to [employer]. Also instructed patient to call [facility] billing to ask about this. Patient denies any allergies to eggs or soy. Patient denies any problems with anesthesia/sedation. Patient denies any oxygen use at home and does not take any diet/weight loss medications. Emmi sent to patient's e-mail.

## 2017-06-14 ENCOUNTER — Encounter: Payer: Self-pay | Admitting: Gastroenterology

## 2017-06-17 ENCOUNTER — Telehealth: Payer: Self-pay | Admitting: Gastroenterology

## 2017-06-17 NOTE — Telephone Encounter (Signed)
Left message for patient to call back, I need some specifics.

## 2017-06-18 NOTE — Telephone Encounter (Signed)
Patient states she has only had 3 tablets of the Bentyl, taking prior to meals, she states it made her LLQ pain worse. It did help with the diarrhea. She stopped taking it and now having watery diarrhea, noticed a small amount of light colored blood on the tissue. She does have a follow up hospital visit on Thursday this week. She denies fever, is able to eat. Please advise.

## 2017-06-18 NOTE — Telephone Encounter (Signed)
Patient advised. She will keep follow up here in office this week.

## 2017-06-18 NOTE — Telephone Encounter (Signed)
If she is having loose stools, she can try immodium as needed. If bentyl is not working, or she doesn't like how she feels, she can stop it. I will see her in clinic this week, will discuss follow up colonoscopy. thanks

## 2017-06-20 ENCOUNTER — Other Ambulatory Visit (INDEPENDENT_AMBULATORY_CARE_PROVIDER_SITE_OTHER): Payer: 59

## 2017-06-20 ENCOUNTER — Telehealth: Payer: Self-pay | Admitting: Gastroenterology

## 2017-06-20 ENCOUNTER — Encounter (INDEPENDENT_AMBULATORY_CARE_PROVIDER_SITE_OTHER): Payer: Self-pay

## 2017-06-20 ENCOUNTER — Encounter: Payer: Self-pay | Admitting: Gastroenterology

## 2017-06-20 ENCOUNTER — Ambulatory Visit (INDEPENDENT_AMBULATORY_CARE_PROVIDER_SITE_OTHER): Payer: 59 | Admitting: Gastroenterology

## 2017-06-20 VITALS — BP 110/62 | HR 67 | Ht 61.75 in | Wt 195.0 lb

## 2017-06-20 DIAGNOSIS — K5731 Diverticulosis of large intestine without perforation or abscess with bleeding: Secondary | ICD-10-CM | POA: Diagnosis not present

## 2017-06-20 DIAGNOSIS — K529 Noninfective gastroenteritis and colitis, unspecified: Secondary | ICD-10-CM | POA: Diagnosis not present

## 2017-06-20 DIAGNOSIS — Z860100 Personal history of colon polyps, unspecified: Secondary | ICD-10-CM

## 2017-06-20 DIAGNOSIS — R109 Unspecified abdominal pain: Secondary | ICD-10-CM | POA: Diagnosis not present

## 2017-06-20 DIAGNOSIS — Z23 Encounter for immunization: Secondary | ICD-10-CM

## 2017-06-20 DIAGNOSIS — K922 Gastrointestinal hemorrhage, unspecified: Secondary | ICD-10-CM

## 2017-06-20 DIAGNOSIS — R932 Abnormal findings on diagnostic imaging of liver and biliary tract: Secondary | ICD-10-CM

## 2017-06-20 LAB — IGA: IgA: 469 mg/dL — ABNORMAL HIGH (ref 68–378)

## 2017-06-20 MED ORDER — AMITRIPTYLINE HCL 10 MG PO TABS: 10.0000 mg | ORAL_TABLET | Freq: Every day | ORAL | 1 refills | Status: DC

## 2017-06-20 MED ORDER — AMITRIPTYLINE HCL 150 MG PO TABS: ORAL_TABLET | ORAL | 1 refills | Status: DC

## 2017-06-20 NOTE — Telephone Encounter (Signed)
Dr. Havery Moros, Is there something else pt can be switched to?

## 2017-06-20 NOTE — Progress Notes (Signed)
HPI :  51 year old female here for follow-up visit. I met her when she was hospitalized for lower GI bleed in May 2018. Colonoscopy showed diverticulosis as the likely etiology, also numerous polyps noted. One large pedunculated polyp removed given it had potential for bleeding and difficult to visualize the head of it entirely, once removed it did not appear ulcerated or to be the cause - prophylactic hemostasis clips placed. During this time she had imaging studies reviewed concerning for possible cirrhosis of the liver.   She reports no significant bleeding since the hospitalization. Only scant bleeding on toilet paper, rarely noted.  She is having some pain in the LLQ and radiates into her lower back. She reports it has been bothering her since prior to the hospitalization for 1-2 years, and is her main complaint. She had been taking NSAIDs previously which has helped this pain. She reports this has been a constant pain, never goes away. Pain presently constantly, with fluctuations in severity. No clear triggers, worse later in the day. She states she can't lie on that side due to pain. She feels a pulling sensation with movements. She reports feeling worse with bentyl and stopped it.    She otherwise reports having 10 bowel movements per day at baseline, loose stools. Having a BM does not make pain any better or worse. She takes immodium which works to help slow down her stool frequency.   She reports 2 grandparents have had cirrhosis of the liver, thought to be due to alcohol. She admits to only rare drinking, nothing routinely noted now or historically. No history of jaundice. History of steatosis noted on remote [place] in 2014. Labs for chronic liver diseases were negative.  Remote workup done for diarrhea - TTG negative but anti-gliadin positive. Total IgA level not obtained at the time.    Past Medical History:  Diagnosis Date  . Anxiety   . Asthma   . Chronic kidney disease 1985   BLOCKED TUBE IN KIDNEY  . Discoid lupus    Dr Ronnald Ramp, dermatologist  . GERD (gastroesophageal reflux disease)   . Headache(784.0)    MIGRAINES  . Insomnia   . Seizures ([place])    EPILEPSY- NO SEIZURES SINCE AGE 38  . Tendonitis      Past Surgical History:  Procedure Laterality Date  . ABDOMINAL HYSTERECTOMY    . ANKLE RECONSTRUCTION  04/2009  . APPENDECTOMY    . CESAREAN SECTION    . CHOLECYSTECTOMY    . COLONOSCOPY WITH PROPOFOL N/A 05/22/2017   Procedure: COLONOSCOPY WITH PROPOFOL;  Surgeon: Manus Gunning, MD;  Location: [facility];  Service: Gastroenterology;  Laterality: N/A;  . DIAGNOSTIC LAPAROSCOPY    . KIDNEY SURGERY     right, straightened tube  . LAPAROSCOPY  09/23/2012   Procedure: LAPAROSCOPY OPERATIVE;  Surgeon: Cheri Fowler, MD;  Location: [facility];  Service: Gynecology;  Laterality: N/A;  . OTHER SURGICAL HISTORY     Hysterectomy with 10cm grown removal, Dr. Willis Modena  . RIGHT OOPHORECTOMY     Family History  Problem Relation Age of Onset  . Coronary artery disease Mother   . Hypertension Mother   . Hypertension Father   . Colon cancer Unknown        grandfather  . Colon cancer Maternal Grandfather    Social History  Substance Use Topics  . Smoking status: Current Every Day Smoker    Packs/day: 0.50    Types: Cigarettes  . Smokeless tobacco: Never Used  Comment: Maybe later  . Alcohol use Yes     Comment: occ   Current Outpatient Prescriptions  Medication Sig Dispense Refill  . albuterol (PROVENTIL) (2.5 MG/3ML) 0.083% nebulizer solution Take 3 mLs (2.5 mg total) by nebulization every 6 (six) hours as needed for wheezing or shortness of breath. 75 mL 12  . dicyclomine (BENTYL) 10 MG capsule Take 10 mg by mouth 4 (four) times daily -  before meals and at bedtime.    . Na Sulfate-K Sulfate-Mg Sulf 17.5-3.13-1.6 GM/180ML SOLN Suprep (no substitutions)-TAKE AS DIRECTED. 354 mL 0  . ondansetron (ZOFRAN) 4 MG tablet Take 1 tablet (4 mg total)  by mouth every 8 (eight) hours as needed for nausea or vomiting. 20 tablet 0  . QVAR 40 MCG/ACT inhaler Inhale 2 puffs into the lungs 2 (two) times daily. 1 Inhaler 6   No current facility-administered medications for this visit.    Allergies  Allergen Reactions  . Symbicort [Budesonide-Formoterol Fumarate] Other (See Comments)    Caused open sores in mouth   . Tramadol Shortness Of Breath  . Amoxicillin-Pot Clavulanate     REACTION: dizziness, fatigue/drowsiness and nausea  . Bactrim [Sulfamethoxazole-Trimethoprim]     Blister in mouth  . Ciprofloxacin Hcl     Blisters in mouth  . Clindamycin/Lincomycin Other (See Comments)    Sores in mouth   . Cymbalta [Duloxetine Hcl] Other (See Comments)    Per pt makes her "jacked up"  . Doxycycline     Fatigue, sleepy, worse   . Keflex [Cephalexin]     Blister in mouth  . Wellbutrin [Bupropion]     Blisters in mouth     Review of Systems: All systems reviewed and negative except where noted in HPI.    US Abdomen Complete  Result Date: 05/21/2017 CLINICAL DATA:  Cirrhosis. EXAM: ABDOMEN ULTRASOUND COMPLETE COMPARISON:  CT abdomen and pelvis 09/11/2016 FINDINGS: Gallbladder: Surgically absent. Common bile duct: Diameter: 3 mm Liver: Coarse, echogenic liver parenchyma diffusely without focal abnormality identified. IVC: No abnormality visualized. Pancreas: Visualized portion unremarkable. Poor visualization of the tail due to bowel gas. Spleen: Size and appearance within normal limits. Right Kidney: Length: 11.9 cm. Echogenicity within normal limits. No mass or hydronephrosis visualized. Left Kidney: Length: 10.6 cm. Echogenicity within normal limits. No mass or hydronephrosis visualized. Abdominal aorta: No aneurysm visualized. Other findings: None. IMPRESSION: 1. Coarse, echogenic liver compatible with provided history of cirrhosis. No focal abnormality identified. 2. Prior cholecystectomy.  No biliary dilatation. Electronically Signed   By:  Logan Bores M.D.   On: 05/21/2017 17:49   Lab Results  Component Value Date   WBC 11.0 (H) 06/03/2017   HGB 11.1 (L) 06/03/2017   HCT 33.7 (L) 06/03/2017   MCV 93.9 06/03/2017   PLT 222 06/03/2017    Lab Results  Component Value Date   CREATININE 0.70 06/03/2017   BUN 8 06/03/2017   NA 140 06/03/2017   K 3.8 06/03/2017   CL 110 06/03/2017   CO2 19 (L) 06/03/2017   Lab Results  Component Value Date   ALT 18 06/03/2017   AST 29 06/03/2017   ALKPHOS 91 06/03/2017   BILITOT 0.5 06/03/2017      Physical Exam: BP 110/62   Pulse 67   Ht 5' 1.75" (1.568 m)   Wt 195 lb (88.5 kg)   BMI 35.96 kg/m  Constitutional: Pleasant,well-developed,female in no acute distress. HEENT: Normocephalic and atraumatic. Conjunctivae are normal. No scleral icterus. Neck supple.  Cardiovascular: Normal  rate, regular rhythm.  Pulmonary/chest: Effort normal and breath sounds normal. No wheezing, rales or rhonchi. Abdominal: Soft, nondistended, LLQ to left flank / back tenderness, negative [place]. Bowel sounds active throughout. There are no masses palpable.  Extremities: no edema Lymphadenopathy: No cervical adenopathy noted. Neurological: Alert and oriented to person place and time. Skin: Skin is warm and dry. No rashes noted. Psychiatric: Normal mood and affect. Behavior is normal.   ASSESSMENT AND PLAN: 51 year old female here for reassessment of the following issues as outlined.  Lower GI bleed / colon polyps - diverticulosis is very likely etiology of this, she has had no recurrence of bleeding since discharge. On colonoscopy she had numerous polyps noted, given her bleeding there are not all removed, however 1 large sigmoid polyp was removed and was determined to be hyperplastic. She is scheduled for a follow-up colonoscopy for polypectomy of the remaining polyps. I discussed risks and benefits and she wished to proceed. Of note the patient reports that her insurance did not payment for  her hospitalization, and asked me to write a letter in support of her appeal which I will do.  Chronic diarrhea - no obvious evidence of colitis or Crohn's disease on initial colonoscopy, we'll biopsy to rule out microscopic colitis on her follow-up exam. Interestingly she has a negative TTG but a positive antigliadin antibody, and she's not had a prior IgA level. Will resend TTG IgA, total IgA, and send TTG IgG as well in case she has an IgA deficiency, ensure no evidence of celiac. I otherwise discussed options for her diarrhea and her abdominal pain, and recommended Elavil 10 mg daily at bedtime and increase to 20 mg as tolerated. She agreed to proceed after discussion of this.  Chronic abdominal pain / back pain - no etiology on CT scan. Based on her history and exam I suspect she likely has musculoskeletal pain, perhaps originating from the lower back. We'll start her on Elavil as outlined above, she should follow-up with primary care for management of back pain.  Abnormal liver imaging - both CT scan and ultrasound are suspicious for cirrhosis. Her LFTs are normal. Labs for chronic liver diseases are relatively unremarkable. She has a remote history of fatty liver on ultrasound which I suspect is the cause if she does have cirrhosis. Her platelet count is normal, INR is at 1.26. Discussed that it is not clear if she has cirrhosis at this time, if she does have it it appears to be compensated. We discussed what cirrhosis is in risk for decompensations and hepatocellular carcinoma. I think very important to establish if she truly has cirrhosis, and to clarify the etiology. In this light I offered her an IR guided liver biopsy. I discussed risks and benefits and she wanted to proceed to clarify the diagnosis. We'll await results. Otherwise she needs to be vaccinated to hepatitis A and B.  Total time spent with patient and writing letter for her request > 45 minutes.  Camptown Cellar, MD [facility]  Gastroenterology Pager [phone]

## 2017-06-20 NOTE — Telephone Encounter (Signed)
I called her back and left her a message. Will her diarrhea and abdominal pain I think it is probably the best option for these symptoms. We are starting her at a very low dose at 10mg . It can cause some drowsiness but at this dose would not think it would be significant. I think trying it at 10mg  and see how she feels may be reasonable, but if she feels strongly against it let me know and we can discuss other options.   I called her and left a message explaining this. Can you please call her tomorrow to see how she wishes to proceed (I am out of the office until Tuesday by the way). Thanks

## 2017-06-20 NOTE — Patient Instructions (Signed)
If you are age 50 or older, your body mass index should be between 23-30. Your Body mass index is 35.96 kg/m. If this is out of the aforementioned range listed, please consider follow up with your Primary Care Provider.  If you are age 3 or younger, your body mass index should be between 19-25. Your Body mass index is 35.96 kg/m. If this is out of the aformentioned range listed, please consider follow up with your Primary Care Provider.   We have sent the following medications to your pharmacy for you to pick up at your convenience:  Elavil  Your physician has requested that you go to the basement for lab work before leaving today.  You have been given your hepatitis A and B vaccine today. Please return for a nurse visit in 30 days for your next injection.  You have been scheduled for a liver biopsy at [facility] on __________ at ___________ . If you need to reschedule this appointment please call [phone]. You will need a driver. The test will take 4 hours to complete.  Radiology will contact you with the appointment date and time for the Liver Biopsy.  Thank you.

## 2017-06-21 ENCOUNTER — Telehealth: Payer: Self-pay

## 2017-06-21 NOTE — Telephone Encounter (Signed)
Attn Cherlynn Kaiser Ward Fax# [phone]

## 2017-06-21 NOTE — Telephone Encounter (Signed)
Spoke to patient, she would like me to fax letter to her husband's work and would also like to pick up the original letter today. Patient will call back with fax number, attn: Stan Ward.

## 2017-06-24 LAB — TISSUE TRANSGLUTAMINASE ABS,IGG,IGA
TISSUE TRANSGLUTAMINASE AB, IGA: 1 U/mL (ref <4)
Tissue Transglut Ab: 4 U/mL (ref <6)

## 2017-06-25 ENCOUNTER — Encounter: Payer: Self-pay | Admitting: Gastroenterology

## 2017-06-28 ENCOUNTER — Encounter: Payer: Self-pay | Admitting: Gastroenterology

## 2017-06-28 ENCOUNTER — Ambulatory Visit (AMBULATORY_SURGERY_CENTER): Payer: 59 | Admitting: Gastroenterology

## 2017-06-28 VITALS — BP 126/64 | HR 61 | Temp 98.0°F | Resp 15 | Ht 61.75 in | Wt 195.0 lb

## 2017-06-28 DIAGNOSIS — D127 Benign neoplasm of rectosigmoid junction: Secondary | ICD-10-CM | POA: Diagnosis not present

## 2017-06-28 DIAGNOSIS — D126 Benign neoplasm of colon, unspecified: Secondary | ICD-10-CM

## 2017-06-28 DIAGNOSIS — K6389 Other specified diseases of intestine: Secondary | ICD-10-CM | POA: Diagnosis not present

## 2017-06-28 DIAGNOSIS — D125 Benign neoplasm of sigmoid colon: Secondary | ICD-10-CM

## 2017-06-28 DIAGNOSIS — K635 Polyp of colon: Secondary | ICD-10-CM

## 2017-06-28 DIAGNOSIS — D123 Benign neoplasm of transverse colon: Secondary | ICD-10-CM

## 2017-06-28 DIAGNOSIS — Z860100 Personal history of colon polyps, unspecified: Secondary | ICD-10-CM

## 2017-06-28 DIAGNOSIS — D120 Benign neoplasm of cecum: Secondary | ICD-10-CM

## 2017-06-28 DIAGNOSIS — D122 Benign neoplasm of ascending colon: Secondary | ICD-10-CM

## 2017-06-28 DIAGNOSIS — K621 Rectal polyp: Secondary | ICD-10-CM

## 2017-06-28 DIAGNOSIS — D128 Benign neoplasm of rectum: Secondary | ICD-10-CM

## 2017-06-28 MED ORDER — SODIUM CHLORIDE 0.9 % IV SOLN: 500.0000 mL | INTRAVENOUS | Status: DC

## 2017-06-28 MED ORDER — LIDOCAINE 5 % EX OINT: 1.0000 "application " | TOPICAL_OINTMENT | Freq: Four times a day (QID) | CUTANEOUS | 0 refills | Status: DC | PRN

## 2017-06-28 NOTE — Progress Notes (Signed)
Alert and oriented x3, pleased with MAC, report to RN Jinny Blossom

## 2017-06-28 NOTE — Patient Instructions (Signed)
Handout given on polyps  YOU HAD AN ENDOSCOPIC PROCEDURE TODAY: Refer to the procedure report and other information in the discharge instructions given to you for any specific questions about what was found during the examination. If this information does not answer your questions, please call  office at [phone] to clarify.   YOU SHOULD EXPECT: Some feelings of bloating in the abdomen. Passage of more gas than usual. Walking can help get rid of the air that was put into your GI tract during the procedure and reduce the bloating. If you had a lower endoscopy (such as a colonoscopy or flexible sigmoidoscopy) you may notice spotting of blood in your stool or on the toilet paper. Some abdominal soreness may be present for a day or two, also.  DIET: Your first meal following the procedure should be a light meal and then it is ok to progress to your normal diet. A half-sandwich or bowl of soup is an example of a good first meal. Heavy or fried foods are harder to digest and may make you feel nauseous or bloated. Drink plenty of fluids but you should avoid alcoholic beverages for 24 hours. If you had a esophageal dilation, please see attached instructions for diet.    ACTIVITY: Your care partner should take you home directly after the procedure. You should plan to take it easy, moving slowly for the rest of the day. You can resume normal activity the day after the procedure however YOU SHOULD NOT DRIVE, use power tools, machinery or perform tasks that involve climbing or major physical exertion for 24 hours (because of the sedation medicines used during the test).   SYMPTOMS TO REPORT IMMEDIATELY: A gastroenterologist can be reached at any hour. Please call [phone]  for any of the following symptoms:  Following lower endoscopy (colonoscopy, flexible sigmoidoscopy) Excessive amounts of blood in the stool  Significant tenderness, worsening of abdominal pains  Swelling of the abdomen that is  new, acute  Fever of 100 or higher    FOLLOW UP:  If any biopsies were taken you will be contacted by phone or by letter within the next 1-3 weeks. Call [phone]  if you have not heard about the biopsies in 3 weeks.  Please also call with any specific questions about appointments or follow up tests.  

## 2017-06-28 NOTE — Progress Notes (Signed)
Called to room to assist during endoscopic procedure.  Patient ID and intended procedure confirmed with present staff. Received instructions for my participation in the procedure from the performing physician.  

## 2017-06-28 NOTE — Progress Notes (Signed)
Pt's states no medical or surgical changes since previsit or office visit. 

## 2017-06-28 NOTE — Op Note (Signed)
[facility] Patient Name: Donna Sullivan Procedure Date: 06/28/2017 3:29 PM MRN: [id number] Endoscopist: Remo Lipps P. Shonta Bourque MD, MD Age: 51 Referring MD:  Date of Birth: May 05, 1966 Gender: Female Account #: [id number] Procedure:                Colonoscopy Indications:              High risk colon cancer surveillance: Personal                            history of colonic polyps, also with ongoing loose                            stools. Medicines:                Monitored Anesthesia Care Procedure:                Pre-Anesthesia Assessment:                           - Prior to the procedure, a History and Physical                            was performed, and patient medications and                            allergies were reviewed. The patient's tolerance of                            previous anesthesia was also reviewed. The risks                            and benefits of the procedure and the sedation                            options and risks were discussed with the patient.                            All questions were answered, and informed consent                            was obtained. Prior Anticoagulants: The patient has                            taken no previous anticoagulant or antiplatelet                            agents. ASA Grade Assessment: III - A patient with                            severe systemic disease. After reviewing the risks                            and benefits, the patient was deemed in  satisfactory condition to undergo the procedure.                           After obtaining informed consent, the colonoscope                            was passed under direct vision. Throughout the                            procedure, the patient's blood pressure, pulse, and                            oxygen saturations were monitored continuously. The                            Model CF-HQ190L [id number]) scope was  introduced                            through the anus and advanced to the the terminal                            ileum, with identification of the appendiceal                            orifice and IC valve. The colonoscopy was performed                            without difficulty. The patient tolerated the                            procedure well. The quality of the bowel                            preparation was adequate. The terminal ileum,                            ileocecal valve, appendiceal orifice, and rectum                            were photographed. Scope In: 4:21:11 PM Scope Out: 5:10:11 PM Scope Withdrawal Time: 0 hours 39 minutes 23 seconds  Total Procedure Duration: 0 hours 49 minutes 0 seconds  Findings:                 The perianal and digital rectal examinations were                            normal.                           The terminal ileum appeared normal.                           Scattered medium-mouthed diverticula were found in  the entire colon, highest burden in the left colon.                           A diminutive polyp was found in the cecum. The                            polyp was sessile. The polyp was removed with a                            cold biopsy forceps. Resection and retrieval were                            complete.                           A 10 mm polyp was found in the ascending colon. The                            polyp was sessile. The polyp was removed with a                            cold snare. Resection and retrieval were complete.                           Four sessile polyps were found in the transverse                            colon. The polyps were 4 to 12 mm in size. These                            polyps were removed with a cold snare. Resection                            and retrieval were complete.                           Three semi-pedunculated polyps were found in the                             sigmoid colon. The polyps were 6 to 7 mm in size.                            These polyps were removed with a hot snare.                            Resection and retrieval were complete.                           Many sessile polyps were noted in the sigmoid                            colon, suspected to be hyperplastic, ranging from  61mm-6mm. Four representative sessile polyps removed                            with a cold snare. Resection and retrieval were                            complete.                           Three sessile polyps were found in the                            recto-sigmoid colon. The polyps were 5 to 7 mm in                            size. These polyps were removed with a cold snare.                            Resection and retrieval were complete.                           A 6 mm polyp was found in the rectum. The polyp was                            semi-pedunculated. The polyp was removed with a hot                            snare. Resection and retrieval were complete.                           A 4 mm polyp was found in the rectum. The polyp was                            sessile. The polyp was removed with a cold snare.                            Resection and retrieval were complete.                           Internal hemorrhoids were found during retroflexion.                           The exam was otherwise without abnormality.                           Biopsies for histology were taken with a cold                            forceps from the right colon and left colon for                            evaluation of microscopic colitis. Complications:            No immediate complications. Estimated  blood loss:                            Minimal. Estimated Blood Loss:     Estimated blood loss was minimal. Impression:               - The examined portion of the ileum was normal.                           -  Diverticulosis in the entire examined colon.                           - One diminutive polyp in the cecum, removed with a                            cold biopsy forceps. Resected and retrieved.                           - One 10 mm polyp in the ascending colon, removed                            with a cold snare. Resected and retrieved.                           - Four 4 to 12 mm polyps in the transverse colon,                            removed with a cold snare. Resected and retrieved.                           - Three 6 to 7 mm polyps in the sigmoid colon,                            removed with a hot snare. Resected and retrieved.                           - Many suspected hyperplastic polyps in the left                            colon. Four representative polyps removed with a                            cold snare. Resected and retrieved.                           - Three 5 to 7 mm polyps at the recto-sigmoid                            colon, removed with a cold snare. Resected and                            retrieved.                           -  One 6 mm polyp in the rectum, removed with a hot                            snare. Resected and retrieved.                           - One 4 mm polyp in the rectum, removed with a cold                            snare. Resected and retrieved.                           - Internal hemorrhoids.                           - The examination was otherwise normal.                           - Biopsies were taken with a cold forceps from the                            right colon and left colon for evaluation of                            microscopic colitis.                           Overall, all suspected adenomatous polyps were                            placed in jar 1. The polyps that were suspected to                            be hyperplastic were placed in jar 3. Recommendation:           - Patient has a contact number available for                             emergencies. The signs and symptoms of potential                            delayed complications were discussed with the                            patient. Return to normal activities tomorrow.                            Written discharge instructions were provided to the                            patient.                           - Resume previous diet.                           -  Continue present medications.                           - Await pathology results.                           - Repeat colonoscopy is recommended for                            surveillance. The colonoscopy date will be                            determined after pathology results from today's                            exam become available for review.                           - No ibuprofen, naproxen, or other non-steroidal                            anti-inflammatory drugs for 2 weeks after polyp                            removal. Remo Lipps P. Brookelyn Gaynor MD, MD 06/28/2017 5:24:38 PM This report has been signed electronically.

## 2017-07-01 ENCOUNTER — Telehealth: Payer: Self-pay | Admitting: Gastroenterology

## 2017-07-01 ENCOUNTER — Telehealth: Payer: Self-pay

## 2017-07-01 NOTE — Telephone Encounter (Signed)
  Follow up Call-  Call back number 06/28/2017  Post procedure Call Back phone  # [phone]  Permission to leave phone message Yes  Some recent data might be hidden     Patient questions:  Do you have a fever, pain , or abdominal swelling? No. Pain Score  0 *  Have you tolerated food without any problems? Yes.    Have you been able to return to your normal activities? Yes.    Do you have any questions about your discharge instructions: Diet   No. Medications  No. Follow up visit  No.  Do you have questions or concerns about your Care? No.  Actions: * If pain score is 4 or above: No action needed, pain <4.

## 2017-07-01 NOTE — Telephone Encounter (Signed)
Routed to endoscopy nurse who called for follow up on Friday's procedure.

## 2017-07-01 NOTE — Telephone Encounter (Signed)
  Follow up Call-  Call back number 06/28/2017  Post procedure Call Back phone  # [phone]  Permission to leave phone message Yes  Some recent data might be hidden     Left message and reminded patient that we still have her QVAR inhaler

## 2017-07-05 ENCOUNTER — Other Ambulatory Visit: Payer: Self-pay | Admitting: Student

## 2017-07-07 ENCOUNTER — Other Ambulatory Visit: Payer: Self-pay | Admitting: Radiology

## 2017-07-08 ENCOUNTER — Encounter (HOSPITAL_COMMUNITY): Payer: Self-pay

## 2017-07-08 ENCOUNTER — Ambulatory Visit (HOSPITAL_COMMUNITY)
Admission: RE | Admit: 2017-07-08 | Discharge: 2017-07-08 | Disposition: A | Discharge status: Home / Self Care | Payer: 59 | Source: Ambulatory Visit | Attending: Gastroenterology | Admitting: Gastroenterology

## 2017-07-08 ENCOUNTER — Telehealth: Payer: Self-pay | Admitting: Gastroenterology

## 2017-07-08 DIAGNOSIS — K7460 Unspecified cirrhosis of liver: Secondary | ICD-10-CM | POA: Diagnosis not present

## 2017-07-08 DIAGNOSIS — K922 Gastrointestinal hemorrhage, unspecified: Secondary | ICD-10-CM | POA: Insufficient documentation

## 2017-07-08 DIAGNOSIS — K529 Noninfective gastroenteritis and colitis, unspecified: Secondary | ICD-10-CM | POA: Insufficient documentation

## 2017-07-08 DIAGNOSIS — R109 Unspecified abdominal pain: Secondary | ICD-10-CM | POA: Insufficient documentation

## 2017-07-08 DIAGNOSIS — R197 Diarrhea, unspecified: Secondary | ICD-10-CM

## 2017-07-08 DIAGNOSIS — R932 Abnormal findings on diagnostic imaging of liver and biliary tract: Secondary | ICD-10-CM | POA: Diagnosis present

## 2017-07-08 LAB — CBC
HCT: 41.7 % (ref 36.0–46.0)
Hemoglobin: 14.2 g/dL (ref 12.0–15.0)
MCH: 28.9 pg (ref 26.0–34.0)
MCHC: 34.1 g/dL (ref 30.0–36.0)
MCV: 84.8 fL (ref 78.0–100.0)
Platelets: 200 K/uL (ref 150–400)
RBC: 4.92 MIL/uL (ref 3.87–5.11)
RDW: 12.7 % (ref 11.5–15.5)
WBC: 11.5 K/uL — ABNORMAL HIGH (ref 4.0–10.5)

## 2017-07-08 LAB — COMPREHENSIVE METABOLIC PANEL WITH GFR
ALT: 23 U/L (ref 14–54)
AST: 36 U/L (ref 15–41)
Albumin: 4 g/dL (ref 3.5–5.0)
Alkaline Phosphatase: 85 U/L (ref 38–126)
Anion gap: 12 (ref 5–15)
BUN: 7 mg/dL (ref 6–20)
CO2: 22 mmol/L (ref 22–32)
Calcium: 9.4 mg/dL (ref 8.9–10.3)
Chloride: 108 mmol/L (ref 101–111)
Creatinine, Ser: 0.6 mg/dL (ref 0.44–1.00)
GFR calc Af Amer: >60 mL/min
GFR calc non Af Amer: >60 mL/min
Glucose, Bld: 121 mg/dL — ABNORMAL HIGH (ref 65–99)
Potassium: 3.8 mmol/L (ref 3.5–5.1)
Sodium: 142 mmol/L (ref 135–145)
Total Bilirubin: 0.5 mg/dL (ref 0.3–1.2)
Total Protein: 7.9 g/dL (ref 6.5–8.1)

## 2017-07-08 LAB — PROTIME-INR
INR: 1.13
Prothrombin Time: 14.6 s (ref 11.4–15.2)

## 2017-07-08 LAB — APTT: aPTT: 36 s (ref 24–36)

## 2017-07-08 MED ORDER — MIDAZOLAM HCL 2 MG/2ML IJ SOLN
INTRAMUSCULAR | Status: AC
  Filled 2017-07-08: qty 4

## 2017-07-08 MED ORDER — FENTANYL CITRATE (PF) 100 MCG/2ML IJ SOLN
INTRAMUSCULAR | Status: AC | PRN
  Administered 2017-07-08: 50 ug via INTRAVENOUS

## 2017-07-08 MED ORDER — HYDROCODONE-ACETAMINOPHEN 5-325 MG PO TABS
1.0000 | ORAL_TABLET | Freq: Once | ORAL | Status: AC
  Administered 2017-07-08: 1 via ORAL
  Filled 2017-07-08: qty 1

## 2017-07-08 MED ORDER — SODIUM CHLORIDE 0.9 % IV SOLN
INTRAVENOUS | Status: DC
  Administered 2017-07-08: 12:00:00 via INTRAVENOUS

## 2017-07-08 MED ORDER — FENTANYL CITRATE (PF) 100 MCG/2ML IJ SOLN
INTRAMUSCULAR | Status: AC
  Filled 2017-07-08: qty 4

## 2017-07-08 MED ORDER — MIDAZOLAM HCL 2 MG/2ML IJ SOLN
INTRAMUSCULAR | Status: AC | PRN
  Administered 2017-07-08: 1 mg via INTRAVENOUS

## 2017-07-08 NOTE — Discharge Instructions (Signed)
Liver Biopsy, Care After °Refer to this sheet in the next few weeks. These instructions provide you with information on caring for yourself after your procedure. Your health care provider may also give you more specific instructions. Your treatment has been planned according to current medical practices, but problems sometimes occur. Call your health care provider if you have any problems or questions after your procedure. °What can I expect after the procedure? °After your procedure, it is typical to have the following: °· A small amount of discomfort in the area where the biopsy was done and in the right shoulder or shoulder blade. °· A small amount of bruising around the area where the biopsy was done and on the skin over the liver. °· Sleepiness and fatigue for the rest of the day. ° °Follow these instructions at home: °· Rest at home for 1-2 days or as directed by your health care provider. °· Have a friend or family member stay with you for at least 24 hours. °· Because of the medicines used during the procedure, you should not do the following things in the first 24 hours: °? Drive. °? Use machinery. °? Be responsible for the care of other people. °? Sign legal documents. °? Take a bath or shower. °· There are many different ways to close and cover an incision, including stitches, skin glue, and adhesive strips. Follow your health care provider's instructions on: °? Incision care. °? Bandage (dressing) changes and removal. °? Incision closure removal. °· Do not drink alcohol in the first week. °· Do not lift more than 5 pounds or play contact sports for 2 weeks after this test. °· Take medicines only as directed by your health care provider. Do not take medicine containing aspirin or non-steroidal anti-inflammatory medicines such as ibuprofen for 1 week after this test. °· It is your responsibility to get your test results. °Contact a health care provider if: °· You have increased bleeding from an incision  that results in more than a small spot of blood. °· You have redness, swelling, or increasing pain in any incisions. °· You notice a discharge or a bad smell coming from any of your incisions. °· You have a fever or chills. °Get help right away if: °· You develop swelling, bloating, or pain in your abdomen. °· You become dizzy or faint. °· You develop a rash. °· You are nauseous or vomit. °· You have difficulty breathing, feel short of breath, or feel faint. °· You develop chest pain. °· You have problems with your speech or vision. °· You have trouble balancing or moving your arms or legs. °This information is not intended to replace advice given to you by your health care provider. Make sure you discuss any questions you have with your health care provider. °Document Released: 07/06/2005 Document Revised: 05/24/2016 Document Reviewed: 02/12/2014 °Elsevier Interactive Patient Education © 2018 [employer] °Moderate Conscious Sedation, Adult, Care After °These instructions provide you with information about caring for yourself after your procedure. Your health care provider may also give you more specific instructions. Your treatment has been planned according to current medical practices, but problems sometimes occur. Call your health care provider if you have any problems or questions after your procedure. °What can I expect after the procedure? °After your procedure, it is common: °· To feel sleepy for several hours. °· To feel clumsy and have poor balance for several hours. °· To have poor judgment for several hours. °· To vomit if you eat   too soon. ° °Follow these instructions at home: °For at least 24 hours after the procedure: ° °· Do not: °? Participate in activities where you could fall or become injured. °? Drive. °? Use heavy machinery. °? Drink alcohol. °? Take sleeping pills or medicines that cause drowsiness. °? Make important decisions or sign legal documents. °? Take care of children on your  own. °· Rest. °Eating and drinking °· Follow the diet recommended by your health care provider. °· If you vomit: °? Drink water, juice, or soup when you can drink without vomiting. °? Make sure you have little or no nausea before eating solid foods. °General instructions °· Have a responsible adult stay with you until you are awake and alert. °· Take over-the-counter and prescription medicines only as told by your health care provider. °· If you smoke, do not smoke without supervision. °· Keep all follow-up visits as told by your health care provider. This is important. °Contact a health care provider if: °· You keep feeling nauseous or you keep vomiting. °· You feel light-headed. °· You develop a rash. °· You have a fever. °Get help right away if: °· You have trouble breathing. °This information is not intended to replace advice given to you by your health care provider. Make sure you discuss any questions you have with your health care provider. °Document Released: 10/07/2013 Document Revised: 05/21/2016 Document Reviewed: 04/07/2016 °Elsevier Interactive Patient Education © 2018 [employer] ° °

## 2017-07-08 NOTE — Telephone Encounter (Signed)
I called the patient, no answer so I left a message, I will call her back tomorrow.

## 2017-07-08 NOTE — Consult Note (Signed)
Chief Complaint: Patient was seen in consultation today for ultrasound-guided random core liver biopsy.  Referring Physician(s): Manus Gunning  Supervising Physician: Daryll Brod  Patient Status: [facility] - Out-pt  History of Present Illness: Donna Sullivan is a 51 y.o. female prior history of abdominal pain as well as imaging which suggests cirrhosis who presents today for ultrasound-guided random core liver biopsy to confirm finding.  Past Medical History:  Diagnosis Date  . Anxiety   . Asthma   . Chronic kidney disease 1985   BLOCKED TUBE IN KIDNEY  . Discoid lupus    Dr Ronnald Ramp, dermatologist  . GERD (gastroesophageal reflux disease)   . Headache(784.0)    MIGRAINES  . Insomnia   . Seizures ([place])    EPILEPSY- NO SEIZURES SINCE AGE 89  . Sleep apnea   . Tendonitis     Past Surgical History:  Procedure Laterality Date  . ABDOMINAL HYSTERECTOMY    . ANKLE RECONSTRUCTION  04/2009  . APPENDECTOMY    . CESAREAN SECTION    . CHOLECYSTECTOMY    . COLONOSCOPY WITH PROPOFOL N/A 05/22/2017   Procedure: COLONOSCOPY WITH PROPOFOL;  Surgeon: Manus Gunning, MD;  Location: [facility];  Service: Gastroenterology;  Laterality: N/A;  . DIAGNOSTIC LAPAROSCOPY    . KIDNEY SURGERY     right, straightened tube  . LAPAROSCOPY  09/23/2012   Procedure: LAPAROSCOPY OPERATIVE;  Surgeon: Cheri Fowler, MD;  Location: [facility];  Service: Gynecology;  Laterality: N/A;  . OTHER SURGICAL HISTORY     Hysterectomy with 10cm grown removal, Dr. Willis Modena  . RIGHT OOPHORECTOMY      Allergies: Symbicort [budesonide-formoterol fumarate]; Tramadol; Amoxicillin-pot clavulanate; Bactrim [sulfamethoxazole-trimethoprim]; Ciprofloxacin hcl; Clindamycin/lincomycin; Cymbalta [duloxetine hcl]; Doxycycline; Keflex [cephalexin]; and Wellbutrin [bupropion]  Medications: Prior to Admission medications   Medication Sig Start Date End Date Taking? Authorizing Provider  albuterol (PROVENTIL) (2.5  MG/3ML) 0.083% nebulizer solution Take 3 mLs (2.5 mg total) by nebulization every 6 (six) hours as needed for wheezing or shortness of breath. 01/17/17  Yes Carlota Raspberry, Tiffany, PA-C  dicyclomine (BENTYL) 10 MG capsule Take 10 mg by mouth 4 (four) times daily -  before meals and at bedtime.   Yes [provider]  ondansetron (ZOFRAN) 4 MG tablet Take 1 tablet (4 mg total) by mouth every 8 (eight) hours as needed for nausea or vomiting. 05/31/17  Yes Armbruster, Renelda Loma, MD  QVAR 40 MCG/ACT inhaler Inhale 2 puffs into the lungs 2 (two) times daily. 01/17/17  Yes Carlota Raspberry, Tiffany, PA-C  amitriptyline (ELAVIL) 10 MG tablet Take 1 tablet (10 mg total) by mouth at bedtime. Take 1 tablet qhs for 1 week then increase to 2 tablets qhs there after Patient not taking: Reported on 06/28/2017 06/20/17   Armbruster, Renelda Loma, MD  lidocaine (XYLOCAINE) 5 % ointment Apply 1 application topically 4 (four) times daily as needed. Apply to rectum as needed 06/28/17   Armbruster, Renelda Loma, MD  oxyCODONE (OXY IR/ROXICODONE) 5 MG immediate release tablet  05/24/17   [provider]     Family History  Problem Relation Age of Onset  . Coronary artery disease Mother   . Hypertension Mother   . Hypertension Father   . Colon cancer Unknown        grandfather  . Colon cancer Maternal Grandfather     Social History   Social History  . Marital status: Married    Spouse name: N/A  . Number of children: N/A  . Years of education:  N/A   Social History Main Topics  . Smoking status: Current Every Day Smoker    Packs/day: 0.50    Types: Cigarettes  . Smokeless tobacco: Never Used     Comment: Maybe later  . Alcohol use Yes     Comment: occ  . Drug use: No  . Sexual activity: Not Asked   Other Topics Concern  . None   Social History Narrative   Lives with daughter.      Review of Systems denies fever, headache, chest pain, dyspnea, nausea, vomiting or abnormal bleeding. She does have  intermittent abdominal cramps, diarrhea as well as chronic back pain and occasional cough.  Vital Signs: BP 116/87   Pulse 65   Resp 16   Ht 5' 1.34" (1.558 m)   Wt 190 lb (86.2 kg)   SpO2 99%   BMI 35.50 kg/m   Physical Exam awake, alert. Chest clear to auscultation bilaterally. Heart with regular rate and rhythm. Abdomen obese, soft, positive bowel sounds, mild right upper quadrant /left lower quadrant tenderness to palpation, no lower extremity edema.  Mallampati Score:     Imaging: No results found.  Labs:  CBC:  Recent Labs  05/22/17 1236  05/22/17 2219 05/23/17 0411 06/03/17 1057 07/08/17 1123  WBC 11.4*  --   --  11.2* 11.0* 11.5*  HGB 9.6*  < > 9.0* 8.5*  8.4* 11.1* 14.2  HCT 28.5*  < > 26.8* 24.7*  24.5* 33.7* 41.7  PLT 149*  --   --  124* 222 200  < > = values in this interval not displayed.  COAGS:  Recent Labs  05/22/17 0344  INR 1.26    BMP:  Recent Labs  05/21/17 1411 05/21/17 1415 05/22/17 0344 05/23/17 0411 06/03/17 1057  NA 138 141 137 138 140  K 4.1 4.2 3.8 3.7 3.8  CL 108 110 111 107 110  CO2 20*  --  18* 25 19*  GLUCOSE 146* 142* 111* 144* 142*  BUN 10 10 15 9 8   CALCIUM 8.7*  --  7.8* 8.4* 8.9  CREATININE 0.81 0.70 0.80 0.80 0.70  GFRNONAA >60  --  >60 >60 >89  GFRAA >60  --  >60 >60 >89    LIVER FUNCTION TESTS:  Recent Labs  05/21/17 1411 06/03/17 1057  BILITOT 0.9 0.5  AST 37 29  ALT 30 18  ALKPHOS 90 91  PROT 7.4 6.9  ALBUMIN 3.8 3.8    TUMOR MARKERS: No results for input(s): AFPTM, CEA, CA199, CHROMGRNA in the last 8760 hours.  Assessment and Plan: 51 y.o. female with prior history of abdominal pain as well as imaging which suggests cirrhosis who presents today for ultrasound-guided random core liver biopsy to confirm finding. Prior CT from 2017 did note a very small enhancing lesion in the right hepatic lobe favoring benign vascular etiology. Risks and benefits discussed with the patient/mother including,  but not limited to bleeding, infection, damage to adjacent structures or low yield requiring additional tests.All of the patient's questions were answered, patient is agreeable to proceed.Consent signed and in chart.     Thank you for this interesting consult.  I greatly enjoyed meeting Donna Sullivan and look forward to participating in their care.  A copy of this report was sent to the requesting provider on this date.  Electronically Signed: D. Rowe Robert, PA-C 07/08/2017, 11:59 AM   I spent a total of 20 minutes    in face to face in clinical consultation, greater  than 50% of which was counseling/coordinating care for ultrasound-guided random core liver biopsy.

## 2017-07-08 NOTE — Telephone Encounter (Signed)
Pt wants Dr. Havery Moros to call her. She is concerned about the "polyps that were not removed when she had her colonoscopy done." Explained path results with pt but she states Dr. Havery Moros told her there were more that he didn't remove. Let pt know message would be sent to Dr. Havery Moros requesting he call her.

## 2017-07-08 NOTE — Procedures (Signed)
Abnormal LFTs  S/p [place] RANDOM LIVER BX  No comp Stable EBL 2CC  PATH PENDING Full report in PACS

## 2017-07-10 MED ORDER — COLESTIPOL HCL 1 G PO TABS: 1.0000 g | ORAL_TABLET | Freq: Two times a day (BID) | ORAL | 3 refills | Status: DC

## 2017-07-10 NOTE — Telephone Encounter (Signed)
Called the patient and reviewed several things with her:  1 - she had multiple polyps removed during colonoscopy, most were hyperplastic, she did have some adenomas / sessile serrated. I did not remove all of the hyperplastic polyps in her left colon which were small as they have no cancerous potential and not causing symptoms. Recall colonoscopy in 3 years, she agreed with that.  2 - I discussed the Elavil with her. I think she has IBS and given her pain and diarrhea thought it would be a good choice. She states her pain is improved and only has loose stools now. She is hesitant to use Elavil due to drowsiness. Will hold off on that. Will try colestid given her history of cholecystectomy to see if that helps. She can follow up as needed for that issue  3 - liver biopsy done, showing cirrhosis from what is suspected to be NASH as the etiology. She is compensated at this time. I discussed long term risks of cirrhosis for decompensations and HCC. Recommend [place] screening every 6 months and alcohol abstinence. She should see me every 6 months for this issue. She is working on weight loss otherwise. All questions answered.

## 2017-07-18 ENCOUNTER — Telehealth: Payer: Self-pay | Admitting: Gastroenterology

## 2017-07-18 NOTE — Telephone Encounter (Signed)
Left patient message that TwinRx is her second Hep A and Hep B immunization.

## 2017-07-26 ENCOUNTER — Telehealth: Payer: Self-pay

## 2017-07-26 NOTE — Telephone Encounter (Signed)
Left a detailed message to call us back to reschedule her Twinrix #2 injection.

## 2017-07-30 NOTE — Telephone Encounter (Signed)
Donna Sullivan has r/s'ed for 08/02/17 at 10:00AM for her #2 Twinrix Standard injection.  I spoke to her on the phone and she just forgot last Fridays appointment.

## 2017-09-12 ENCOUNTER — Telehealth: Payer: Self-pay | Admitting: *Deleted

## 2017-09-12 DIAGNOSIS — R932 Abnormal findings on diagnostic imaging of liver and biliary tract: Secondary | ICD-10-CM

## 2017-09-12 NOTE — Telephone Encounter (Signed)
Left voicemail for patient to call back. 

## 2017-09-12 NOTE — Telephone Encounter (Signed)
-----   Message from Larina Bras, [place] sent at 07/12/2017 11:56 AM EDT -----   Notes recorded by Manus Gunning, MD on 07/10/2017 at 6:05 PM EDT Called patient and relayed results of biopsy - see telephone note for details  Caryl Pina I would like her to have RUQ [place] in 10/2017 for [facility] screening (last done in May) along wit labs at that time CBC, CMET, INR I would also like her to follow up with me in clinic at that time.

## 2017-09-16 NOTE — Telephone Encounter (Signed)
I have left a voicemail for patient to call back. 

## 2017-09-18 NOTE — Telephone Encounter (Signed)
Left voicemail for patient to call back. I have also sent a note to her home address since we have been unsuccessful in getting in contact with her by phone.

## 2017-09-23 ENCOUNTER — Telehealth: Payer: Self-pay | Admitting: *Deleted

## 2017-09-23 NOTE — Addendum Note (Signed)
Addended by: Larina Bras on: 09/23/2017 03:52 PM   Modules accepted: Orders

## 2017-09-23 NOTE — Telephone Encounter (Signed)
Patient called back. She has scheduled RUQ abdominal ultrasound at [facility] radiology 10/07/17 at 930 am with 915 am arrival, NPO 6 hours prior. She is also asked to come for labs at our office either before or after ultrasound. Patient requests 2nd twinrix. I advised that depending on Dr Doyne Keel recommendation, she may have to restart the series. She had her first vaccine in June and should have had the second vaccine in July but no showed that appointment. Patient is scheduled for follow up with Dr Hilarie Fredrickson on 10/25/17 at 11 am to follow up so this can be discussed at that time. Patient verbalizes understanding of this.

## 2017-09-23 NOTE — Telephone Encounter (Signed)
Error

## 2017-10-07 ENCOUNTER — Ambulatory Visit (HOSPITAL_COMMUNITY): Payer: 59

## 2017-10-10 ENCOUNTER — Telehealth: Payer: Self-pay | Admitting: Gastroenterology

## 2017-10-10 ENCOUNTER — Ambulatory Visit (HOSPITAL_COMMUNITY): Payer: 59 | Attending: Gastroenterology

## 2017-10-22 NOTE — Telephone Encounter (Signed)
Called and spoke to pt. She was scheduled to have an U/S on 10-10-17 and lab work done prior to her appt this Friday, 10-25-17.  She indicated she can't afford the U/S which would cost her $500 out of pocket.  She agreed to have labs done prior to appt and will discuss the necessity of [place] with Dr. Enis Gash at her appt on 10-25-17.

## 2017-10-25 ENCOUNTER — Ambulatory Visit: Payer: 59 | Admitting: Gastroenterology

## 2017-11-26 ENCOUNTER — Other Ambulatory Visit (INDEPENDENT_AMBULATORY_CARE_PROVIDER_SITE_OTHER): Payer: 59

## 2017-11-26 ENCOUNTER — Encounter: Payer: Self-pay | Admitting: Gastroenterology

## 2017-11-26 ENCOUNTER — Encounter (INDEPENDENT_AMBULATORY_CARE_PROVIDER_SITE_OTHER): Payer: Self-pay

## 2017-11-26 ENCOUNTER — Ambulatory Visit: Payer: 59 | Admitting: Gastroenterology

## 2017-11-26 VITALS — BP 116/64 | HR 68 | Ht 61.75 in | Wt 188.0 lb

## 2017-11-26 DIAGNOSIS — Z23 Encounter for immunization: Secondary | ICD-10-CM

## 2017-11-26 DIAGNOSIS — Z86010 Personal history of colonic polyps: Secondary | ICD-10-CM

## 2017-11-26 DIAGNOSIS — R197 Diarrhea, unspecified: Secondary | ICD-10-CM

## 2017-11-26 DIAGNOSIS — K7460 Unspecified cirrhosis of liver: Secondary | ICD-10-CM

## 2017-11-26 DIAGNOSIS — R21 Rash and other nonspecific skin eruption: Secondary | ICD-10-CM

## 2017-11-26 LAB — COMPREHENSIVE METABOLIC PANEL
ALBUMIN: 4.4 g/dL (ref 3.5–5.2)
ALT: 23 U/L (ref 0–35)
AST: 32 U/L (ref 0–37)
Alkaline Phosphatase: 94 U/L (ref 39–117)
BILIRUBIN TOTAL: 0.8 mg/dL (ref 0.2–1.2)
BUN: 9 mg/dL (ref 6–23)
CO2: 22 mEq/L (ref 19–32)
Calcium: 9.6 mg/dL (ref 8.4–10.5)
Chloride: 107 mEq/L (ref 96–112)
Creatinine, Ser: 0.66 mg/dL (ref 0.40–1.20)
GFR: 100.45 mL/min (ref >60.00)
Glucose, Bld: 105 mg/dL — ABNORMAL HIGH (ref 70–99)
POTASSIUM: 3.9 meq/L (ref 3.5–5.1)
SODIUM: 138 meq/L (ref 135–145)
TOTAL PROTEIN: 8.2 g/dL (ref 6.0–8.3)

## 2017-11-26 LAB — CBC WITH DIFFERENTIAL/PLATELET
BASOS ABS: 0.1 10*3/uL (ref 0.0–0.1)
BASOS PCT: 0.9 % (ref 0.0–3.0)
Eosinophils Absolute: 1.1 10*3/uL — ABNORMAL HIGH (ref 0.0–0.7)
Eosinophils Relative: 8.5 % — ABNORMAL HIGH (ref 0.0–5.0)
HEMATOCRIT: 47.3 % — AB (ref 36.0–46.0)
HEMOGLOBIN: 15.8 g/dL — AB (ref 12.0–15.0)
LYMPHS PCT: 35.4 % (ref 12.0–46.0)
Lymphs Abs: 4.6 10*3/uL — ABNORMAL HIGH (ref 0.7–4.0)
MCHC: 33.4 g/dL (ref 30.0–36.0)
MCV: 87.1 fl (ref 78.0–100.0)
Monocytes Absolute: 0.9 10*3/uL (ref 0.1–1.0)
Monocytes Relative: 6.6 % (ref 3.0–12.0)
Neutro Abs: 6.3 10*3/uL (ref 1.4–7.7)
Neutrophils Relative %: 48.6 % (ref 43.0–77.0)
Platelets: 189 10*3/uL (ref 150.0–400.0)
RBC: 5.43 Mil/uL — AB (ref 3.87–5.11)
RDW: 15.4 % (ref 11.5–15.5)
WBC: 13 10*3/uL — AB (ref 4.0–10.5)

## 2017-11-26 LAB — PROTIME-INR
INR: 1.2 ratio — ABNORMAL HIGH (ref 0.8–1.0)
Prothrombin Time: 13.1 s (ref 9.6–13.1)

## 2017-11-26 LAB — HEMOGLOBIN A1C: Hgb A1c MFr Bld: 6.1 % (ref 4.6–6.5)

## 2017-11-26 MED ORDER — VALACYCLOVIR HCL 1 G PO TABS: 1000.0000 mg | ORAL_TABLET | Freq: Three times a day (TID) | ORAL | 0 refills | Status: DC

## 2017-11-26 NOTE — Progress Notes (Signed)
HPI :  51 year old female here for follow-up visit. Previously known to me from hospitalization for diverticular related bleeding in May 2018. During that time she was noted to incidentally have a cirrhotic-appearing liver on imaging.   Since last visit she has had the following interventions:  Colonoscopy 06/28/2017 - normal ileum, diverticulosis - highest burden in the left colon, 5 polyps tranverse to cecum, multiple left sided hyperplastic polyps, internal hemorrhoids, biopsies taken no evidence of microscopic colitis - some of polyps from transverse to right colon were adenomas / sessile serrated, all of polyps on left side were hyperplastic polyps  Liver biopsy done 07/08/2017 - c/w cirrhosis secondary to NASH  The patient has not followed up since her last visit with me until now. Regarding her bowels, she is using Imodium as needed and her stools are fairly regular at this point. Most of the time she has formed stools, occasionally has some loose stools throughout the week. She declined a trial of Elavil as well as Colestid. She is happy with Imodium at this time and using Bentyl when necessary.  Regarding her cirrhosis, she has had no decompensations regarding this in general. No jaundice, no ascites, no lower extremity edema. She denies any use of alcohol. She has lost 20 pounds intentionally since last visit. She asks about herbal remedies for her cirrhosis. She has not had a prior upper endoscopy. She is due for Twinrix vaccines. She has declined flu shots in the past. She continues to decline flu shot.  She otherwise endorses a rash along her lower back which is painful. She has had a few vesicular lesions abrupt in recent days.    Past Medical History:  Diagnosis Date  . Anxiety   . Asthma   . Chronic kidney disease 1985   BLOCKED TUBE IN KIDNEY  . Cirrhosis ([place])    secondary to NASH  . Discoid lupus    Dr Ronnald Ramp, dermatologist  . GERD (gastroesophageal reflux disease)   .  Headache(784.0)    MIGRAINES  . Insomnia   . Seizures ([place])    EPILEPSY- NO SEIZURES SINCE AGE 19  . Sleep apnea   . Tendonitis      Past Surgical History:  Procedure Laterality Date  . ABDOMINAL HYSTERECTOMY    . ANKLE RECONSTRUCTION  04/2009  . APPENDECTOMY    . CESAREAN SECTION    . CHOLECYSTECTOMY    . COLONOSCOPY WITH PROPOFOL N/A 05/22/2017   Procedure: COLONOSCOPY WITH PROPOFOL;  Surgeon: Manus Gunning, MD;  Location: [facility];  Service: Gastroenterology;  Laterality: N/A;  . DIAGNOSTIC LAPAROSCOPY    . KIDNEY SURGERY     right, straightened tube  . LAPAROSCOPY  09/23/2012   Procedure: LAPAROSCOPY OPERATIVE;  Surgeon: Cheri Fowler, MD;  Location: [facility];  Service: Gynecology;  Laterality: N/A;  . RIGHT OOPHORECTOMY     Family History  Problem Relation Age of Onset  . Coronary artery disease Mother   . Hypertension Mother   . Hypertension Father   . Colon cancer Maternal Grandfather    Social History   Tobacco Use  . Smoking status: Current Every Day Smoker    Packs/day: 0.50    Types: Cigarettes  . Smokeless tobacco: Never Used  . Tobacco comment: Maybe later  Substance Use Topics  . Alcohol use: Yes    Comment: occ  . Drug use: No   Current Outpatient Medications  Medication Sig Dispense Refill  . albuterol (PROVENTIL) (2.5 MG/3ML) 0.083% nebulizer solution Take  3 mLs (2.5 mg total) by nebulization every 6 (six) hours as needed for wheezing or shortness of breath. 75 mL 12  . Lido-Capsaicin-Men-Methyl Sal (1ST MEDX-PATCH/ LIDOCAINE EX) Apply topically.    Marland Kitchen QVAR 40 MCG/ACT inhaler Inhale 2 puffs into the lungs 2 (two) times daily. 1 Inhaler 6  . dicyclomine (BENTYL) 10 MG capsule Take 10 mg by mouth 4 (four) times daily -  before meals and at bedtime. Has for prn use    . valACYclovir (VALTREX) 1000 MG tablet Take 1 tablet (1,000 mg total) by mouth 3 (three) times daily. 21 tablet 0   No current facility-administered medications for this  visit.    Allergies  Allergen Reactions  . Symbicort [Budesonide-Formoterol Fumarate] Other (See Comments)    Caused open sores in mouth   . Tramadol Shortness Of Breath  . Amoxicillin-Pot Clavulanate     REACTION: dizziness, fatigue/drowsiness and nausea  . Bactrim [Sulfamethoxazole-Trimethoprim]     Blister in mouth  . Ciprofloxacin Hcl     Blisters in mouth  . Clindamycin/Lincomycin Other (See Comments)    Sores in mouth   . Cymbalta [Duloxetine Hcl] Other (See Comments)    Per pt makes her "jacked up"  . Doxycycline     Fatigue, sleepy, worse   . Keflex [Cephalexin]     Blister in mouth  . Wellbutrin [Bupropion]     Blisters in mouth     Review of Systems: All systems reviewed and negative except where noted in HPI.   Lab Results  Component Value Date   ALT 23 07/08/2017   AST 36 07/08/2017   ALKPHOS 85 07/08/2017   BILITOT 0.5 07/08/2017    Lab Results  Component Value Date   CREATININE 0.60 07/08/2017   BUN 7 07/08/2017   NA 142 07/08/2017   K 3.8 07/08/2017   CL 108 07/08/2017   CO2 22 07/08/2017    Lab Results  Component Value Date   INR 1.13 07/08/2017   INR 1.26 05/22/2017    Lab Results  Component Value Date   WBC 11.5 (H) 07/08/2017   HGB 14.2 07/08/2017   HCT 41.7 07/08/2017   MCV 84.8 07/08/2017   PLT 200 07/08/2017     Physical Exam: BP 116/64   Pulse 68   Ht 5' 1.75" (1.568 m)   Wt 188 lb (85.3 kg)   BMI 34.66 kg/m  Constitutional: Pleasant,well-developed, female in no acute distress. HEENT: Normocephalic and atraumatic. Conjunctivae are normal. No scleral icterus. Neck supple.  Cardiovascular: Normal rate, regular rhythm.  Pulmonary/chest: Effort normal and breath sounds normal. No wheezing, rales or rhonchi. Abdominal: Soft, nondistended, nontender.  There are no masses palpable. No hepatomegaly. Extremities: no edema Lymphadenopathy: No cervical adenopathy noted. Neurological: Alert and oriented to person place and  time. Skin: Skin is warm and dry. 2 vesicular lesions along back, diffusely tender in dermatomal distribution, with ? levido reticularis Psychiatric: Normal mood and affect. Behavior is normal.   ASSESSMENT AND PLAN: 51 year old female here for reassessment of the following issues:  NASH cirrhosis - negative serologic workup, liver biopsy confirmed finding since last visit. I discussed cirrhosis with her in general, and to include risks for decompensation and liver cancer. She has compensated cirrhosis at this time. She does not drink alcohol. I suspect Karlene Lineman likely due to being overweight the past. She has lost 20 pounds intentionally since have seen her. She'll continue to work on this. I advised against use of herbal remedies at this point.  She can consume coffee regularly which is helpful for Karlene Lineman if there is active inflammation. She is due for basic labs at this time, will screen for DM given NASH, due for [facility] screening with an ultrasound, due for screening EGD for varices. We'll vaccinate against hepatitis A and B today. She continues to decline flu shot. Discussed risks and benefits of endoscopy and anesthesia with her and she wanted to proceed.  Diarrhea - suspect IBS D, controlled with Imodium when necessary at this point, can follow-up as needed for this issue.Marland Kitchen  History of colon polyps - some adenomas and sessile serrated polyps, due for recall colonoscopy in 3 years. I reassured her that left sided hyperplastic polyps are benign.  Rash - unclear if she has shingles, but with vesicular lesions and pain to palpation in dermatomal distribution along the lower back, we'll treat her for this with valacyclovir 1000 mg 3 times a day for one week. If she has worsening symptoms or fails to improve I asked her to see her primary care.  Max Cellar, MD [facility] Pager [phone]  CC: Scot Jun, FNP

## 2017-11-26 NOTE — Patient Instructions (Addendum)
If you are age 50 or older, your body mass index should be between 23-30. Your Body mass index is 34.66 kg/m. If this is out of the aforementioned range listed, please consider follow up with your Primary Care Provider.  If you are age 99 or younger, your body mass index should be between 19-25. Your Body mass index is 34.66 kg/m. If this is out of the aformentioned range listed, please consider follow up with your Primary Care Provider.   You have been scheduled for an endoscopy. Please follow written instructions given to you at your visit today. If you use inhalers (even only as needed), please bring them with you on the day of your procedure. Your physician has requested that you go to www.startemmi.com and enter the access code given to you at your visit today. This web site gives a general overview about your procedure. However, you should still follow specific instructions given to you by our office regarding your preparation for the procedure.  Your physician has requested that you go to the basement for lab work before leaving today.  We have sent the following medications to your pharmacy for you to pick up at your convenience: Valacyclovir  You have been scheduled for an abdominal ultrasound at [facility] (1st floor of hospital) on Friday, 11-29-17 at 9:30am. Please arrive 15 minutes prior to your appointment for registration. Make certain not to have anything to eat or drink 6 hours prior to your appointment. Should you need to reschedule your appointment, please contact radiology at [phone]. This test typically takes about 30 minutes to perform.  We have given you a Hep A/Hep B  vaccination today.  You will need to call and schedule a nurse visit for the 2nd injection for 6 months from now on May 26, 2018.  Please call our office at [phone] in early May to schedule this important appointment.   Thank you.

## 2017-11-27 ENCOUNTER — Other Ambulatory Visit: Payer: Self-pay | Admitting: Gastroenterology

## 2017-11-27 LAB — AFP TUMOR MARKER: AFP-Tumor Marker: 2.5 ng/mL

## 2017-11-27 NOTE — Telephone Encounter (Signed)
OK to give Zofran for pt?  She was in clinic yesterday.  Thank you.

## 2017-11-27 NOTE — Telephone Encounter (Signed)
Yes that's fine. thanks

## 2017-11-29 ENCOUNTER — Ambulatory Visit (HOSPITAL_COMMUNITY)
Admission: RE | Admit: 2017-11-29 | Discharge: 2017-11-29 | Disposition: A | Discharge status: Home / Self Care | Payer: 59 | Source: Ambulatory Visit | Attending: Gastroenterology | Admitting: Gastroenterology

## 2017-11-29 DIAGNOSIS — Z9049 Acquired absence of other specified parts of digestive tract: Secondary | ICD-10-CM | POA: Insufficient documentation

## 2017-11-29 DIAGNOSIS — R932 Abnormal findings on diagnostic imaging of liver and biliary tract: Secondary | ICD-10-CM | POA: Insufficient documentation

## 2017-12-02 ENCOUNTER — Telehealth: Payer: Self-pay | Admitting: Gastroenterology

## 2017-12-02 NOTE — Telephone Encounter (Signed)
Her [place] is c/w cirrhosis, but no mass lesions or concerning findings. Can you please place a recall for repeat US in 6 months for [facility] screening.  I don't see anything that would cause her pain otherwise. I recently treated her for shingles last week, not sure if her pain is lower back / abdomen where her rash was previously. Thanks

## 2017-12-02 NOTE — Telephone Encounter (Signed)
Routed to Dr. Havery Moros, not sure if you have had a chance to look at this result yet. Thank you.

## 2017-12-03 NOTE — Telephone Encounter (Signed)
Left message to call back  

## 2017-12-03 NOTE — Telephone Encounter (Signed)
Patient advised of results of [place] and recommendation to repeat in 6 months.

## 2017-12-17 ENCOUNTER — Encounter: Payer: Self-pay | Admitting: Gastroenterology

## 2017-12-22 ENCOUNTER — Encounter (HOSPITAL_BASED_OUTPATIENT_CLINIC_OR_DEPARTMENT_OTHER): Payer: Self-pay | Admitting: Emergency Medicine

## 2017-12-22 ENCOUNTER — Other Ambulatory Visit: Payer: Self-pay

## 2017-12-22 ENCOUNTER — Emergency Department (HOSPITAL_BASED_OUTPATIENT_CLINIC_OR_DEPARTMENT_OTHER)
Admission: EM | Admit: 2017-12-22 | Discharge: 2017-12-22 | Disposition: A | Discharge status: Home / Self Care | Payer: 59 | Attending: Emergency Medicine | Admitting: Emergency Medicine

## 2017-12-22 DIAGNOSIS — R21 Rash and other nonspecific skin eruption: Secondary | ICD-10-CM | POA: Diagnosis present

## 2017-12-22 DIAGNOSIS — J449 Chronic obstructive pulmonary disease, unspecified: Secondary | ICD-10-CM | POA: Insufficient documentation

## 2017-12-22 DIAGNOSIS — L509 Urticaria, unspecified: Secondary | ICD-10-CM

## 2017-12-22 DIAGNOSIS — F17210 Nicotine dependence, cigarettes, uncomplicated: Secondary | ICD-10-CM | POA: Insufficient documentation

## 2017-12-22 DIAGNOSIS — N189 Chronic kidney disease, unspecified: Secondary | ICD-10-CM | POA: Diagnosis not present

## 2017-12-22 DIAGNOSIS — L508 Other urticaria: Secondary | ICD-10-CM | POA: Diagnosis not present

## 2017-12-22 DIAGNOSIS — J45909 Unspecified asthma, uncomplicated: Secondary | ICD-10-CM | POA: Diagnosis not present

## 2017-12-22 DIAGNOSIS — Z79899 Other long term (current) drug therapy: Secondary | ICD-10-CM | POA: Diagnosis not present

## 2017-12-22 LAB — PROTIME-INR
INR: 1.09
Prothrombin Time: 14 seconds (ref 11.4–15.2)

## 2017-12-22 LAB — CBC WITH DIFFERENTIAL/PLATELET
Basophils Absolute: 0 10*3/uL (ref 0.0–0.1)
Basophils Relative: 0 %
Eosinophils Absolute: 0.5 10*3/uL (ref 0.0–0.7)
Eosinophils Relative: 3 %
HEMATOCRIT: 48.2 % — AB (ref 36.0–46.0)
Hemoglobin: 16 g/dL — ABNORMAL HIGH (ref 12.0–15.0)
LYMPHS ABS: 5.3 10*3/uL — AB (ref 0.7–4.0)
LYMPHS PCT: 41 %
MCH: 29 pg (ref 26.0–34.0)
MCHC: 33.2 g/dL (ref 30.0–36.0)
MCV: 87.3 fL (ref 78.0–100.0)
MONO ABS: 0.8 10*3/uL (ref 0.1–1.0)
MONOS PCT: 6 %
NEUTROS ABS: 6.5 10*3/uL (ref 1.7–7.7)
Neutrophils Relative %: 50 %
Platelets: 201 10*3/uL (ref 150–400)
RBC: 5.52 MIL/uL — ABNORMAL HIGH (ref 3.87–5.11)
RDW: 14.3 % (ref 11.5–15.5)
WBC: 13.1 10*3/uL — ABNORMAL HIGH (ref 4.0–10.5)

## 2017-12-22 LAB — COMPREHENSIVE METABOLIC PANEL
ALBUMIN: 4 g/dL (ref 3.5–5.0)
ALK PHOS: 90 U/L (ref 38–126)
ALT: 26 U/L (ref 14–54)
ANION GAP: 10 (ref 5–15)
AST: 36 U/L (ref 15–41)
BUN: 11 mg/dL (ref 6–20)
CO2: 22 mmol/L (ref 22–32)
Calcium: 8.9 mg/dL (ref 8.9–10.3)
Chloride: 106 mmol/L (ref 101–111)
Creatinine, Ser: 0.77 mg/dL (ref 0.44–1.00)
GFR calc Af Amer: >60 mL/min (ref >60)
GFR calc non Af Amer: >60 mL/min (ref >60)
GLUCOSE: 103 mg/dL — AB (ref 65–99)
POTASSIUM: 3.7 mmol/L (ref 3.5–5.1)
SODIUM: 138 mmol/L (ref 135–145)
Total Bilirubin: 0.7 mg/dL (ref 0.3–1.2)
Total Protein: 7.8 g/dL (ref 6.5–8.1)

## 2017-12-22 MED ORDER — DIPHENHYDRAMINE HCL 25 MG PO CAPS
50.0000 mg | ORAL_CAPSULE | Freq: Once | ORAL | Status: AC
  Administered 2017-12-22: 50 mg via ORAL
  Filled 2017-12-22: qty 2

## 2017-12-22 MED ORDER — PREDNISONE 20 MG PO TABS: 40.0000 mg | ORAL_TABLET | Freq: Every day | ORAL | 0 refills | Status: DC

## 2017-12-22 MED ORDER — FAMOTIDINE 20 MG PO TABS
20.0000 mg | ORAL_TABLET | Freq: Once | ORAL | Status: AC
  Administered 2017-12-22: 20 mg via ORAL
  Filled 2017-12-22: qty 1

## 2017-12-22 MED ORDER — LORAZEPAM 2 MG/ML IJ SOLN
0.5000 mg | Freq: Once | INTRAMUSCULAR | Status: AC
  Administered 2017-12-22: 0.5 mg via INTRAVENOUS
  Filled 2017-12-22: qty 1

## 2017-12-22 MED ORDER — HYDROXYZINE HCL 25 MG PO TABS: 12.5000 mg | ORAL_TABLET | Freq: Three times a day (TID) | ORAL | 0 refills | Status: DC | PRN

## 2017-12-22 MED ORDER — PREDNISONE 20 MG PO TABS
40.0000 mg | ORAL_TABLET | Freq: Once | ORAL | Status: AC
  Administered 2017-12-22: 40 mg via ORAL
  Filled 2017-12-22: qty 2

## 2017-12-22 NOTE — ED Triage Notes (Signed)
Patient states that she is having an allergic reaction to something. She has had hives and itching all over since yesterday

## 2017-12-22 NOTE — Discharge Instructions (Signed)
Unknown cause of your rash.  However your lab work has been very reassuring in the ED.  I would continue taking the prednisone starting tomorrow for the next 4 days.  I would also take the Pepcid twice a day.  You can also take a Zyrtec in the morning.  If your itching persist at night you may take a Atarax to help you sleep.  I would not take these medications around-the-clock together for several days in the row.  Make sure you follow-up with your primary care doctor.  You may need to follow-up with an allergist or dermatologist if symptoms persist.  Return to the ED with any worsening symptoms.

## 2017-12-22 NOTE — ED Notes (Signed)
Pt refusing blood pressure to be taken due to "It pinched my arm." RN in room when statement was made, she is aware.

## 2017-12-23 NOTE — ED Provider Notes (Signed)
[facility] EM[facility]ENC[facility] DEPARTMENT Provider Note   CSN: [id number] Arrival date & time: 12/22/17  1729     History   Chief Complaint Chief Complaint  Patient presents with  . Urticaria    HPI Donna Sullivan is a 51 y.o. female.  HPI 51 year old Caucasian female past medical history significant for cirrhosis secondary to Karlene Lineman, CKD that presents to the emergency department today for evaluation of urticarial-like rash.  The patient states that she started developing hives and redness yesterday.  States that she took a Benadryl which helps her symptoms.  States this morning the Benadryl wore off and the hives return.  Reports full body itching.  She has no history of any allergies.  She denies any new soaps, detergents, medications.  Patient states that approximately 4 weeks ago she did have shingles which was treated with valacyclovir.  She denies any recent insect bites however states that over the past summer she was bitten by a tick.  Patient denies any difficulties breathing or swallowing.  States that the Benadryl helped but she has not tried any other medications for her symptoms.  Denies any chest tightness or shortness of breath.  Is any associated fevers.  Nothing makes her symptoms better or worse. Past Medical History:  Diagnosis Date  . Anxiety   . Asthma   . Chronic kidney disease 1985   BLOCKED TUBE IN KIDNEY  . Cirrhosis ([place])    secondary to NASH  . Discoid lupus    Dr Ronnald Ramp, dermatologist  . GERD (gastroesophageal reflux disease)   . Headache(784.0)    MIGRAINES  . Insomnia   . Seizures ([place])    EPILEPSY- NO SEIZURES SINCE AGE 68  . Sleep apnea   . Tendonitis     Patient Active Problem List   Diagnosis Date Noted  . Cirrhosis of liver without ascites ([place])   . Benign neoplasm of sigmoid colon   . Lower GI bleed 05/21/2017  . Insomnia 09/06/2016  . COPD with chronic bronchitis ([place]) 09/06/2016  . Tension headache 03/02/2016  . Right lumbar  radiculopathy 03/02/2016  . Acute bacterial sinusitis 12/04/2014  . Pelvic mass in female 03/18/2014  . Hematuria, microscopic 03/05/2014  . Left flank pain 06/08/2013  . Routine general medical examination at a health care facility 04/10/2013  . Chest pain, atypical 04/10/2013  . LLQ pain 04/10/2013  . Trapezius muscle spasm 03/29/2013  . Hair loss 11/14/2012  . Depression 11/14/2012  . Pelvic pain in female 09/23/2012  . Fecal incontinence 05/13/2012  . Seasonal allergic rhinitis 05/13/2012  . Panic anxiety syndrome 05/13/2012  . Stress incontinence, female 11/06/2011  . ROM 05/22/2010  . ANKLE PAIN, LEFT 03/09/2009  . LUPUS ERYTHEMATOSUS, DISCOID 01/26/2009    Past Surgical History:  Procedure Laterality Date  . ABDOMINAL HYSTERECTOMY    . ANKLE RECONSTRUCTION  04/2009  . APPENDECTOMY    . CESAREAN SECTION    . CHOLECYSTECTOMY    . COLONOSCOPY WITH PROPOFOL N/A 05/22/2017   Procedure: COLONOSCOPY WITH PROPOFOL;  Surgeon: Manus Gunning, MD;  Location: [facility];  Service: Gastroenterology;  Laterality: N/A;  . DIAGNOSTIC LAPAROSCOPY    . KIDNEY SURGERY     right, straightened tube  . LAPAROSCOPY  09/23/2012   Procedure: LAPAROSCOPY OPERATIVE;  Surgeon: Cheri Fowler, MD;  Location: [facility];  Service: Gynecology;  Laterality: N/A;  . RIGHT OOPHORECTOMY      OB History    Gravida Para Term Preterm AB Living   3 1  2 1   SAB TAB Ectopic Multiple Live Births                   Home Medications    Prior to Admission medications   Medication Sig Start Date End Date Taking? Authorizing Provider  albuterol (PROVENTIL) (2.5 MG/3ML) 0.083% nebulizer solution Take 3 mLs (2.5 mg total) by nebulization every 6 (six) hours as needed for wheezing or shortness of breath. 01/17/17   Delos Haring, PA-C  dicyclomine (BENTYL) 10 MG capsule Take 10 mg by mouth 4 (four) times daily -  before meals and at bedtime. Has for prn use    [provider]  hydrOXYzine  (ATARAX/VISTARIL) 25 MG tablet Take 0.5-1 tablets (12.5-25 mg total) by mouth every 8 (eight) hours as needed for itching. 12/22/17   Doristine Devoid, PA-C  Lido-Capsaicin-Men-Methyl Sal (1ST MEDX-PATCH/ LIDOCAINE EX) Apply topically.    [provider]  ondansetron (ZOFRAN) 4 MG tablet TAKE 1 CAPSULE BY MOUTH EVERY 8 HOURS ASNEEDED FOR NAUSEA OR VOMITING 11/28/17   Armbruster, Carlota Raspberry, MD  predniSONE (DELTASONE) 20 MG tablet Take 2 tablets (40 mg total) by mouth daily with breakfast. 12/22/17   Ocie Cornfield T, PA-C  QVAR 40 MCG/ACT inhaler Inhale 2 puffs into the lungs 2 (two) times daily. 01/17/17   Delos Haring, PA-C  valACYclovir (VALTREX) 1000 MG tablet Take 1 tablet (1,000 mg total) by mouth 3 (three) times daily. 11/26/17   Armbruster, Carlota Raspberry, MD    Family History Family History  Problem Relation Age of Onset  . Coronary artery disease Mother   . Hypertension Mother   . Hypertension Father   . Colon cancer Maternal Grandfather     Social History Social History   Tobacco Use  . Smoking status: Current Every Day Smoker    Packs/day: 0.50    Types: Cigarettes  . Smokeless tobacco: Never Used  . Tobacco comment: Maybe later  Substance Use Topics  . Alcohol use: Yes    Comment: occ  . Drug use: No     Allergies   Symbicort [budesonide-formoterol fumarate]; Tramadol; Amoxicillin-pot clavulanate; Bactrim [sulfamethoxazole-trimethoprim]; Ciprofloxacin hcl; Clindamycin/lincomycin; Cymbalta [duloxetine hcl]; Doxycycline; Keflex [cephalexin]; and Wellbutrin [bupropion]   Review of Systems Review of Systems  Constitutional: Negative for chills and fever.  HENT: Negative for congestion.   Respiratory: Negative for chest tightness, shortness of breath and wheezing.   Cardiovascular: Negative for chest pain.  Gastrointestinal: Negative for diarrhea, nausea and vomiting.  Musculoskeletal: Positive for myalgias.  Skin: Positive for rash.  Neurological:  Negative for weakness.     Physical Exam Updated Vital Signs BP (!) 120/58 (BP Location: Right Arm)   Pulse 70   Temp 98.1 F (36.7 C) (Oral)   Resp 16   Ht 5\' 2"  (1.575 m)   Wt 85.3 kg (188 lb)   SpO2 97%   BMI 34.39 kg/m   Physical Exam  Constitutional: She is oriented to person, place, and time. She appears well-developed and well-nourished. No distress.  HENT:  Head: Normocephalic and atraumatic.  Oropharynx is clear.  Managing secretions and tolerating her airway.  Eyes: Conjunctivae are normal. Right eye exhibits no discharge. Left eye exhibits no discharge. No scleral icterus.  Neck: Normal range of motion. Neck supple.  Pulmonary/Chest: Effort normal and breath sounds normal. No stridor. No respiratory distress. She has no wheezes. She has no rales. She exhibits no tenderness.  Musculoskeletal: Normal range of motion.  Patient with erythematous maculopapular urticarial-like hives  to the upper torso, breast, upper extremities and lower extremities.  She does have some scabbed over lesions of the lower back consistent with her prior shingles infection.    Neurological: She is alert and oriented to person, place, and time.  Skin: Skin is warm and dry. Capillary refill takes less than 2 seconds. No pallor.  Psychiatric: Her behavior is normal. Judgment and thought content normal.  Nursing note and vitals reviewed.    ED Treatments / Results  Labs (all labs ordered are listed, but only abnormal results are displayed) Labs Reviewed  COMPREHENSIVE METABOLIC PANEL - Abnormal; Notable for the following components:      Result Value   Glucose, Bld 103 (*)    All other components within normal limits  CBC WITH DIFFERENTIAL/PLATELET - Abnormal; Notable for the following components:   WBC 13.1 (*)    RBC 5.52 (*)    Hemoglobin 16.0 (*)    HCT 48.2 (*)    Lymphs Abs 5.3 (*)    All other components within normal limits  PROTIME-INR    EKG  EKG Interpretation None         Radiology No results found.  Procedures Procedures (including critical care time)  Medications Ordered in ED Medications  diphenhydrAMINE (BENADRYL) capsule 50 mg (50 mg Oral Given 12/22/17 1906)  famotidine (PEPCID) tablet 20 mg (20 mg Oral Given 12/22/17 1906)  predniSONE (DELTASONE) tablet 40 mg (40 mg Oral Given 12/22/17 1906)  LORazepam (ATIVAN) injection 0.5 mg (0.5 mg Intravenous Given 12/22/17 2030)     Initial Impression / Assessment and Plan / ED Course  I have reviewed the triage vital signs and the nursing notes.  Pertinent labs & imaging results that were available during my care of the patient were reviewed by me and considered in my medical decision making (see chart for details).     Patient presents to the ED for evaluation of urticarial-like rash that is pruritic in nature to her entire body that started yesterday.  Unknown cause of her rash.  On exam patient is overall well-appearing and nontoxic.  Vital signs are reassuring.  She has no signs of anaphylaxis.  Lungs clear to auscultation bilaterally.  Discussed with patient that we do not know the cause of the hives.  We will give her Benadryl, Pepcid and steroids.  Nurse informed me that patient was very upset that I did not perform lab work on patient.  I went to reassess patient.  She states that she could have "taking the Benadryl at home instead of pain $2000 for an ED visit".  She states that if you would I reviewed my records and did a thorough assessment of the I do have a history of elevated liver enzymes, cirrhosis and elevated white blood cell count.  Patient is followed by GI doctor.  States that she wants to make sure that her rash is not coming from her elevated liver enzymes.  She also states that she was told to follow-up with a hematologist for her elevated white blood cell count.  I did review patient's records.  Her liver enzymes and I have been elevated for the past 4 years.  At some point she  was diagnosed with cirrhosis from an ultrasound however I cannot find these findings.  Patient's total bilirubin has been normal.  She does have chronic elevated leukocytosis of 11,000.  Unknown etiology of this.  Patient had lab work done by her GI doctor 3 weeks ago that was unremarkable.  She is requesting a more thorough workup in the ED.  She is requested that I performed blood work to test for her liver functions.  I do not believe that this is the cause of her rash.  Seems more allergic in nature.  Patient has a mild leukocytosis of 13,000 which is consistent from prior.  Due to the steroids as well.  She also seems mildly hemoconcentrated.  Her liver enzymes are completely normal.  Her total bilirubin is completely normal.  PT/INR are unremarkable.  Discussed with patient that her lab works are very reassuring.  Unknown cause of her symptoms.  Did discuss with her that the tick bite that she sustained in the past summer may have caused a meat allergy.  However we did not test this in the ED.  Would raise concern given that patient has developed symptoms after eating meat the past 2 days.  I also told patient that she would benefit from a allergist referral and possibly dermatology.  Patient has improved with medications in the ED.  Her rash and itching has improved.  Again patient has no further signs of anaphylaxis.  She is watchful hours in the ED without any worsening symptoms.  Discussed symptomatic treatment at home and close follow-up.  Pt is hemodynamically stable, in NAD, & able to ambulate in the ED. Evaluation does not show pathology that would require ongoing emergent intervention or inpatient treatment. I explained the diagnosis to the patient. Pain has been managed & has no complaints prior to dc. Pt is comfortable with above plan and is stable for discharge at this time. All questions were answered prior to disposition. Strict return precautions for f/u to the ED were discussed.  Encouraged follow up with PCP.  Pt was dicussed with Dr. Laverta Baltimore who is agreeable with the above plan.   Final Clinical Impressions(s) / ED Diagnoses   Final diagnoses:  Urticaria  Rash    ED Discharge Orders        Ordered    predniSONE (DELTASONE) 20 MG tablet  Daily with breakfast     12/22/17 2122    hydrOXYzine (ATARAX/VISTARIL) 25 MG tablet  Every 8 hours PRN     12/22/17 2122       Doristine Devoid, PA-C 12/23/17 0218    Long, [facility], MD 12/23/17 [phone]

## 2017-12-27 ENCOUNTER — Ambulatory Visit (AMBULATORY_SURGERY_CENTER): Payer: 59 | Admitting: Gastroenterology

## 2017-12-27 ENCOUNTER — Encounter: Payer: Self-pay | Admitting: Gastroenterology

## 2017-12-27 VITALS — BP 111/69 | HR 96 | Temp 99.1°F | Resp 18 | Ht 61.0 in | Wt 196.0 lb

## 2017-12-27 DIAGNOSIS — K2270 Barrett's esophagus without dysplasia: Secondary | ICD-10-CM | POA: Diagnosis not present

## 2017-12-27 DIAGNOSIS — K7469 Other cirrhosis of liver: Secondary | ICD-10-CM

## 2017-12-27 DIAGNOSIS — K317 Polyp of stomach and duodenum: Secondary | ICD-10-CM | POA: Diagnosis not present

## 2017-12-27 DIAGNOSIS — D131 Benign neoplasm of stomach: Secondary | ICD-10-CM | POA: Diagnosis not present

## 2017-12-27 DIAGNOSIS — K228 Other specified diseases of esophagus: Secondary | ICD-10-CM | POA: Diagnosis not present

## 2017-12-27 MED ORDER — OMEPRAZOLE 40 MG PO CPDR: 40.0000 mg | DELAYED_RELEASE_CAPSULE | Freq: Every day | ORAL | 1 refills | Status: DC

## 2017-12-27 MED ORDER — SODIUM CHLORIDE 0.9 % IV SOLN: 500.0000 mL | INTRAVENOUS | Status: DC

## 2017-12-27 NOTE — Progress Notes (Signed)
Called to room to assist during endoscopic procedure.  Patient ID and intended procedure confirmed with present staff. Received instructions for my participation in the procedure from the performing physician.  

## 2017-12-27 NOTE — Progress Notes (Signed)
Report to PACU, RN, vss, BBS= Clear.  

## 2017-12-27 NOTE — Patient Instructions (Signed)
**Handout on GERD given to patient**  YOU HAD AN ENDOSCOPIC PROCEDURE TODAY AT [facility]:   Refer to the procedure report that was given to you for any specific questions about what was found during the examination.  If the procedure report does not answer your questions, please call your gastroenterologist to clarify.  If you requested that your care partner not be given the details of your procedure findings, then the procedure report has been included in a sealed envelope for you to review at your convenience later.  YOU SHOULD EXPECT: Some feelings of bloating in the abdomen. Passage of more gas than usual.  Walking can help get rid of the air that was put into your GI tract during the procedure and reduce the bloating. If you had a lower endoscopy (such as a colonoscopy or flexible sigmoidoscopy) you may notice spotting of blood in your stool or on the toilet paper. If you underwent a bowel prep for your procedure, you may not have a normal bowel movement for a few days.  Please Note:  You might notice some irritation and congestion in your nose or some drainage.  This is from the oxygen used during your procedure.  There is no need for concern and it should clear up in a day or so.  SYMPTOMS TO REPORT IMMEDIATELY:     Following upper endoscopy (EGD)  Vomiting of blood or coffee ground material  New chest pain or pain under the shoulder blades  Painful or persistently difficult swallowing  New shortness of breath  Fever of 100F or higher  Black, tarry-looking stools  For urgent or emergent issues, a gastroenterologist can be reached at any hour by calling [phone].   DIET:  We do recommend a small meal at first, but then you may proceed to your regular diet.  Drink plenty of fluids but you should avoid alcoholic beverages for 24 hours.  ACTIVITY:  You should plan to take it easy for the rest of today and you should NOT DRIVE or use heavy machinery until  tomorrow (because of the sedation medicines used during the test).    FOLLOW UP: Our staff will call the number listed on your records the next business day following your procedure to check on you and address any questions or concerns that you may have regarding the information given to you following your procedure. If we do not reach you, we will leave a message.  However, if you are feeling well and you are not experiencing any problems, there is no need to return our call.  We will assume that you have returned to your regular daily activities without incident.  If any biopsies were taken you will be contacted by phone or by letter within the next 1-3 weeks.  Please call us at [phone] if you have not heard about the biopsies in 3 weeks.    SIGNATURES/CONFIDENTIALITY: You and/or your care partner have signed paperwork which will be entered into your electronic medical record.  These signatures attest to the fact that that the information above on your After Visit Summary has been reviewed and is understood.  Full responsibility of the confidentiality of this discharge information lies with you and/or your care-partner.YOU HAD AN ENDOSCOPIC PROCEDURE TODAY AT [facility]:   Refer to the procedure report that was given to you for any specific questions about what was found during the examination.  If the procedure report does not answer your questions,  please call your gastroenterologist to clarify.  If you requested that your care partner not be given the details of your procedure findings, then the procedure report has been included in a sealed envelope for you to review at your convenience later.  YOU SHOULD EXPECT: Some feelings of bloating in the abdomen. Passage of more gas than usual.  Walking can help get rid of the air that was put into your GI tract during the procedure and reduce the bloating. If you had a lower endoscopy (such as a colonoscopy or flexible  sigmoidoscopy) you may notice spotting of blood in your stool or on the toilet paper. If you underwent a bowel prep for your procedure, you may not have a normal bowel movement for a few days.  Please Note:  You might notice some irritation and congestion in your nose or some drainage.  This is from the oxygen used during your procedure.  There is no need for concern and it should clear up in a day or so.  SYMPTOMS TO REPORT IMMEDIATELY:   Following upper endoscopy (EGD)  Vomiting of blood or coffee ground material  New chest pain or pain under the shoulder blades  Painful or persistently difficult swallowing  New shortness of breath  Fever of 100F or higher  Black, tarry-looking stools  For urgent or emergent issues, a gastroenterologist can be reached at any hour by calling [phone].   DIET:  We do recommend a small meal at first, but then you may proceed to your regular diet.  Drink plenty of fluids but you should avoid alcoholic beverages for 24 hours.  ACTIVITY:  You should plan to take it easy for the rest of today and you should NOT DRIVE or use heavy machinery until tomorrow (because of the sedation medicines used during the test).    FOLLOW UP: Our staff will call the number listed on your records the next business day following your procedure to check on you and address any questions or concerns that you may have regarding the information given to you following your procedure. If we do not reach you, we will leave a message.  However, if you are feeling well and you are not experiencing any problems, there is no need to return our call.  We will assume that you have returned to your regular daily activities without incident.  If any biopsies were taken you will be contacted by phone or by letter within the next 1-3 weeks.  Please call us at [phone] if you have not heard about the biopsies in 3 weeks.    SIGNATURES/CONFIDENTIALITY: You and/or your care partner have  signed paperwork which will be entered into your electronic medical record.  These signatures attest to the fact that that the information above on your After Visit Summary has been reviewed and is understood.  Full responsibility of the confidentiality of this discharge information lies with you and/or your care-partner.

## 2017-12-27 NOTE — Op Note (Signed)
[facility] Patient Name: Donna Sullivan Procedure Date: 12/27/2017 9:52 AM MRN: [id number] Endoscopist: Remo Lipps P. Armbruster MD, MD Age: 51 Referring MD:  Date of Birth: [birth year] Gender: Female Account #: [id number] Procedure:                Upper GI endoscopy Indications:              history of Cirrhosis, screening for esophageal                            varices Medicines:                Monitored Anesthesia Care Procedure:                Pre-Anesthesia Assessment:                           - Prior to the procedure, a History and Physical                            was performed, and patient medications and                            allergies were reviewed. The patient's tolerance of                            previous anesthesia was also reviewed. The risks                            and benefits of the procedure and the sedation                            options and risks were discussed with the patient.                            All questions were answered, and informed consent                            was obtained. Prior Anticoagulants: The patient has                            taken no previous anticoagulant or antiplatelet                            agents. ASA Grade Assessment: III - A patient with                            severe systemic disease. After reviewing the risks                            and benefits, the patient was deemed in                            satisfactory condition to undergo the procedure.  After obtaining informed consent, the endoscope was                            passed under direct vision. Throughout the                            procedure, the patient's blood pressure, pulse, and                            oxygen saturations were monitored continuously. The                            Endoscope was introduced through the mouth, and                            advanced to the second part of duodenum.  The upper                            GI endoscopy was accomplished without difficulty.                            The patient tolerated the procedure well. Scope In: Scope Out: Findings:                 Esophagogastric landmarks were identified: the                            Z-line was found at 38 cm, the gastroesophageal                            junction was found at 38 cm and the upper extent of                            the gastric folds was found at 41 cm from the                            incisors.                           A 3 cm hiatal hernia was present.                           LA Grade A esophagitis was found 38 cm from the                            incisors with one focal area of erosion and                            erythema.                           The Z-line was irregular with multiple short  tongues of salmon colored mucosa, longest 1cm in                            length. Biopsies were taken with a cold forceps for                            histology to rule out Barrett's.                           The exam of the esophagus was otherwise normal. No                            esophageal varices                           A single 7 mm sessile polyp was found in the                            gastric body. The polyp was removed with a hot                            snare. Resection and retrieval were complete.                           The exam of the stomach was otherwise normal. No                            gastric varices                           The duodenal bulb and second portion of the                            duodenum were normal. Complications:            No immediate complications. Estimated blood loss:                            Minimal. Estimated Blood Loss:     Estimated blood loss was minimal. Impression:               - Esophagogastric landmarks identified.                           - 3 cm hiatal hernia.                            - LA Grade A reflux esophagitis.                           - Z-line irregular. Biopsied.                           - A single gastric polyp. Resected and retrieved.                           -  Normal duodenal bulb and second portion of the                            duodenum. Recommendation:           - Patient has a contact number available for                            emergencies. The signs and symptoms of potential                            delayed complications were discussed with the                            patient. Return to normal activities tomorrow.                            Written discharge instructions were provided to the                            patient.                           - Resume previous diet.                           - Continue present medications.                           - Start omeprazole 40mg  once daily for esophagitis                           - Await pathology results. Remo Lipps P. Armbruster MD, MD 12/27/2017 10:20:12 AM This report has been signed electronically.

## 2017-12-30 ENCOUNTER — Telehealth: Payer: Self-pay

## 2017-12-30 NOTE — Telephone Encounter (Signed)
  Follow up Call-  Call back number 12/27/2017 06/28/2017  Post procedure Call Back phone  # [phone]  Permission to leave phone message Yes Yes  Some recent data might be hidden     [facility]/Recovery Room Follow-up

## 2018-01-01 ENCOUNTER — Telehealth: Payer: Self-pay | Admitting: *Deleted

## 2018-01-01 NOTE — Telephone Encounter (Signed)
  Follow up Call-  Call back number 12/27/2017 06/28/2017  Post procedure Call Back phone  # [phone]  Permission to leave phone message Yes Yes  Some recent data might be hidden     Patient questions:  Do you have a fever, pain , or abdominal swelling? No. Pain Score  0 *  Have you tolerated food without any problems? Yes.    Have you been able to return to your normal activities? Yes.    Do you have any questions about your discharge instructions: Diet   No. Medications  No. Follow up visit  No.  Do you have questions or concerns about your Care? No.  Actions: * If pain score is 4 or above: No action needed, pain <4.

## 2018-01-02 ENCOUNTER — Ambulatory Visit: Payer: 59 | Admitting: Gastroenterology

## 2018-01-02 ENCOUNTER — Encounter: Payer: Self-pay | Admitting: Gastroenterology

## 2018-01-25 IMAGING — DX DG LUMBAR SPINE COMPLETE 4+V
5 series · 5 of 5 positions shown · non-contrast
Comparison: CT scan of March 08, 2014.

CLINICAL DATA: Right lower back and buttocks pain for 6 months
without reported injury.

EXAM:
LUMBAR SPINE - COMPLETE 4+ VIEW

[l-spine ap]
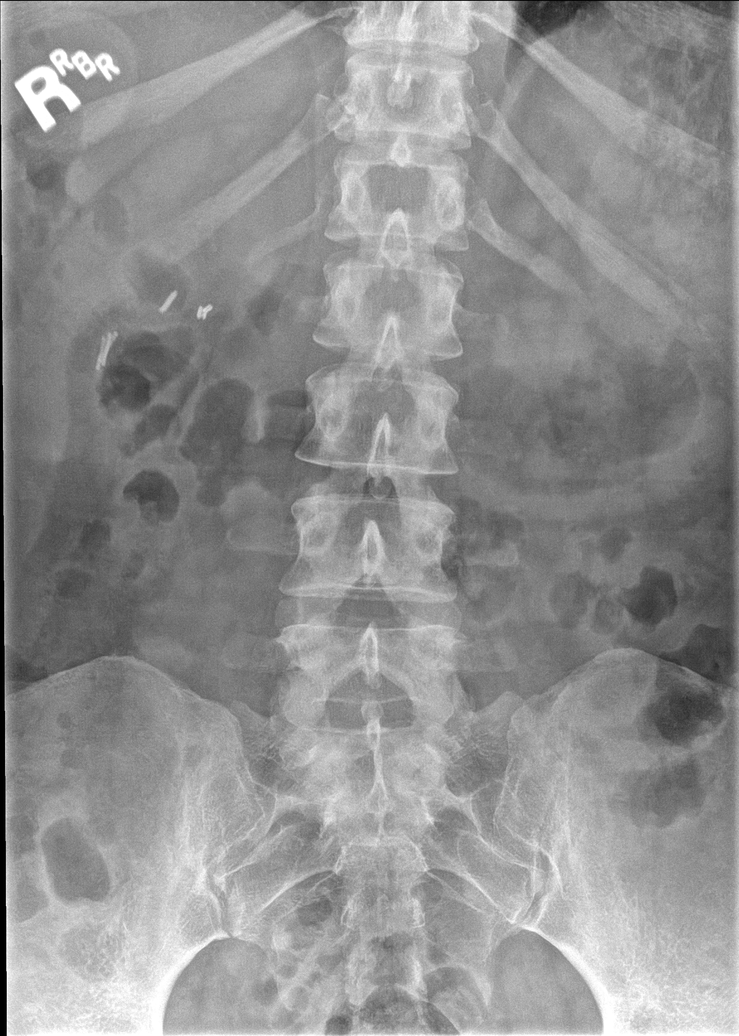

[l-spine obl (1 of 2)]
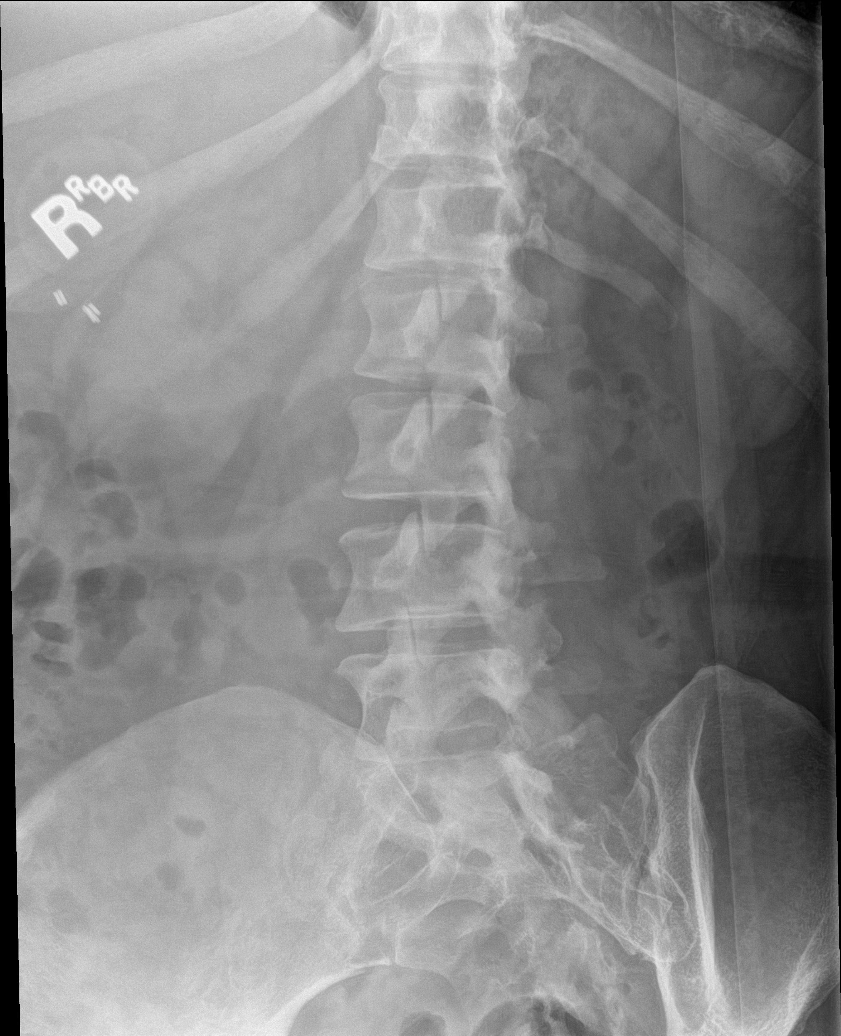

[l-spine obl (2 of 2)]
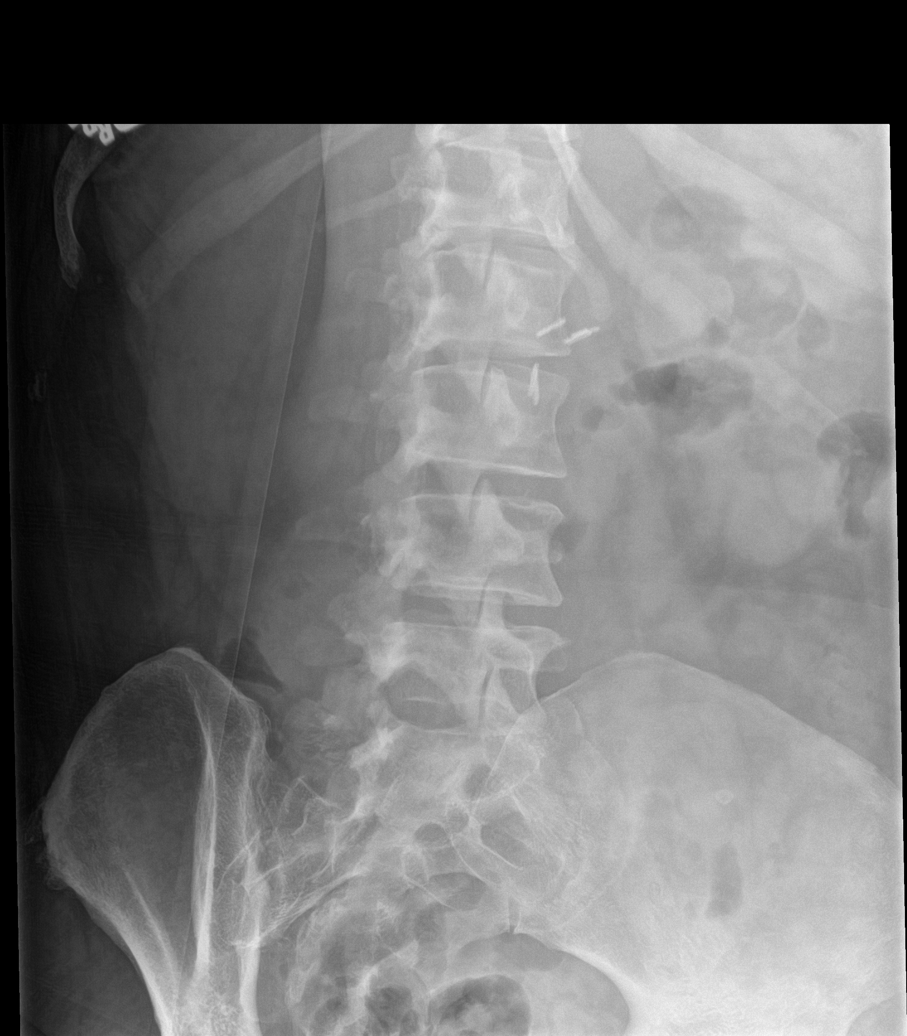

[l-spine lat]
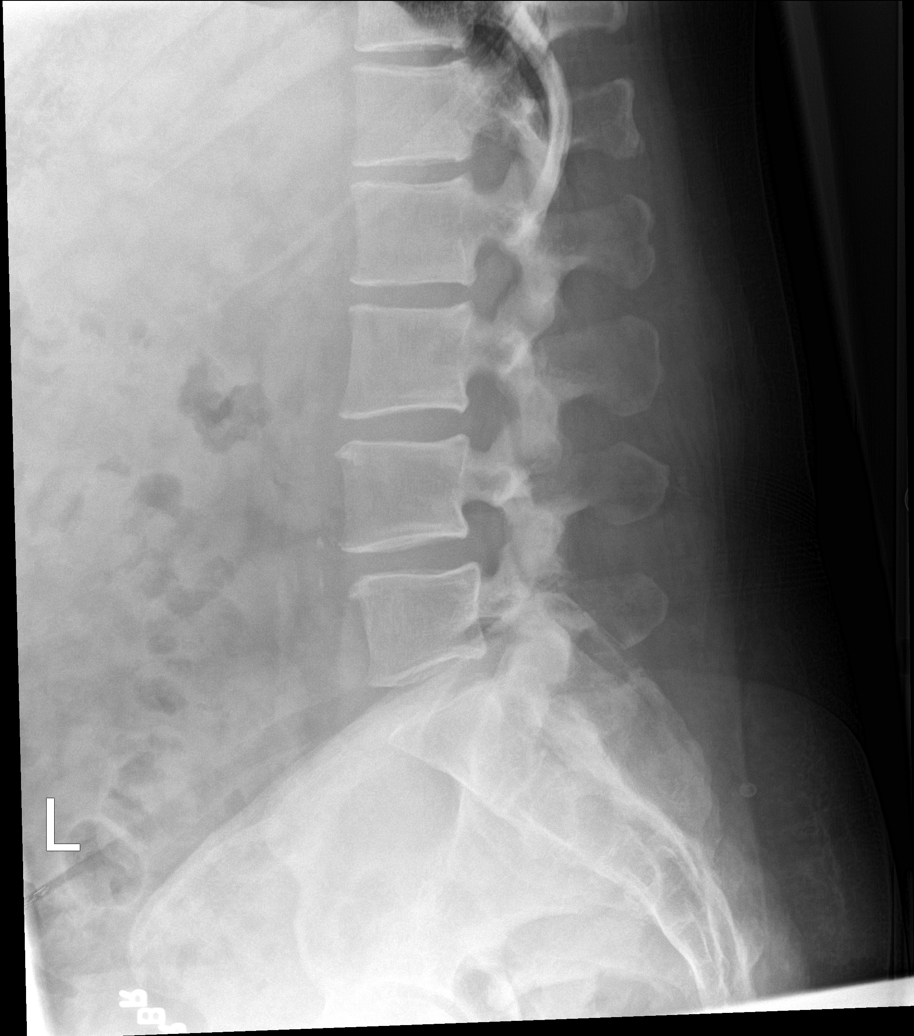

[l-spine spot]
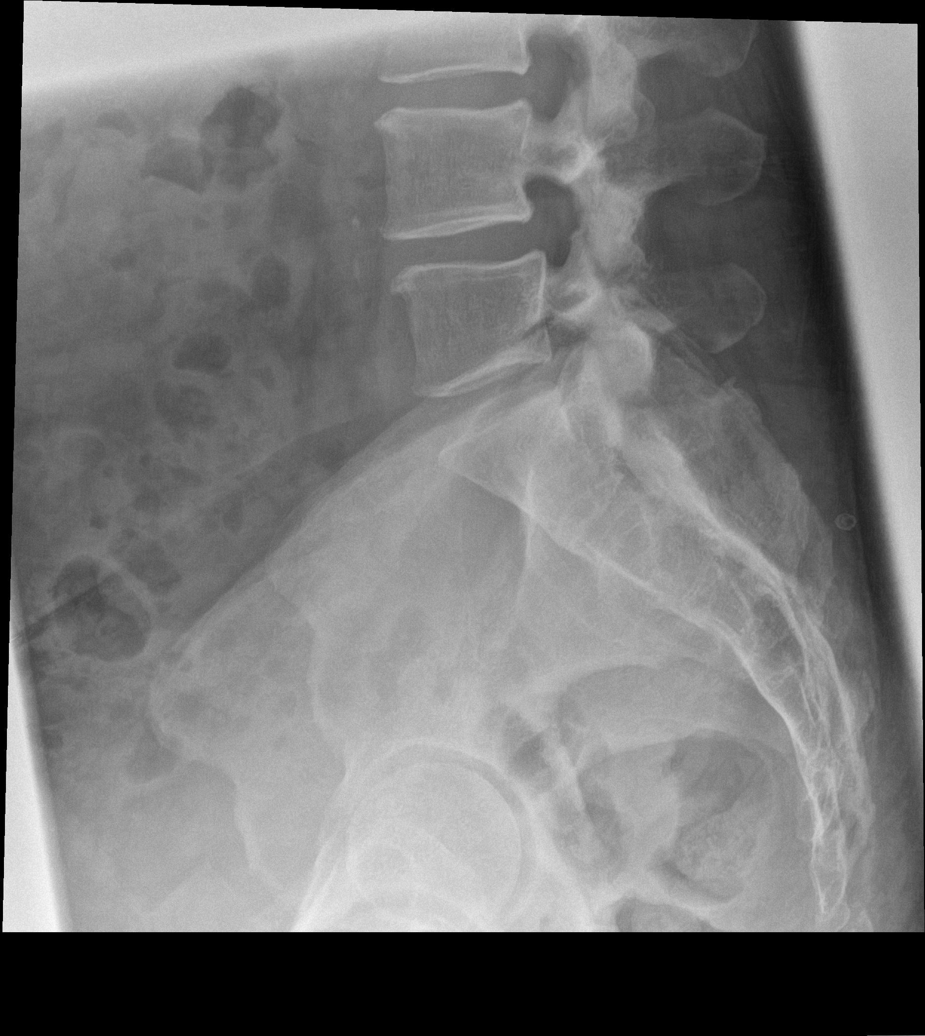

[5 of 5 positions shown; findings below may reference images not displayed]

FINDINGS: No fracture or significant spondylolisthesis is noted. Minimal
anterior osteophyte formation is noted at L3-4 and L4-5. Posterior
facet joints appear intact.
IMPRESSION: Minimal degenerative changes are noted. No acute abnormality seen in
the lumbar spine.

## 2018-03-11 ENCOUNTER — Telehealth: Payer: Self-pay | Admitting: Gastroenterology

## 2018-03-11 NOTE — Telephone Encounter (Signed)
Patient called still having "discolored area on abdomen and across back", says it looks like "blood vessels" she is concerned it is related to liver. She also has a new area on RUQ of elongated, painful blisters, about 3" long. She went to her PCP today and they did some blood work Astronomer). Please advise. Patient also had xray of left hip today for ongoing hip pain.

## 2018-03-11 NOTE — Telephone Encounter (Signed)
This does not sound typical for a rash related to liver disease although it's hard to say for sure without seeing it myself. I had previously seen her for something similar and empirically treated her for shingles in the past. It's good she saw her PCM about this issue, not sure if she has shingles or needs to see dermatology, but defer to her primary are regarding that. Thanks

## 2018-03-12 NOTE — Telephone Encounter (Signed)
Advised patient to follow up with her PCP or possibly dermatologist for these two issues. Advised patient that if needed she could certainly come back for a follow up visit here.

## 2018-05-01 ENCOUNTER — Telehealth: Payer: Self-pay

## 2018-05-01 NOTE — Telephone Encounter (Signed)
-----   Message from Roetta Sessions, [place] sent at 11/26/2017  1:24 PM EST ----- Regarding: 3rd Twinrix injection due May 27 Call pt to make sure she has scheduled appt for 3rd Twinrix injection due May 27

## 2018-05-01 NOTE — Telephone Encounter (Signed)
Called pt and Left detailed message about needing to schedule her for her final twinrix inj on Tuesday, May 28th. See office note from 11-26-17.  At that time series was semi restarted because of multiple nurse visit no shows and cancellations.  Per Dr. Havery Moros she received an injection on 11-26-18 at office visit and he ordered the 3rd and last in 6 months = May 26, 2018. Pt was advised of the importance of making and keeping appt on Tues, May 28th.

## 2018-05-01 NOTE — Telephone Encounter (Signed)
Patient states she is returning a phone call to Jan regarding scheduling an injection.

## 2018-05-01 NOTE — Telephone Encounter (Signed)
Scheduled pt for May 28th at 11:00am with nurse for final Twinrix inj.

## 2018-05-27 ENCOUNTER — Ambulatory Visit (INDEPENDENT_AMBULATORY_CARE_PROVIDER_SITE_OTHER): Payer: 59 | Admitting: Gastroenterology

## 2018-05-27 DIAGNOSIS — Z23 Encounter for immunization: Secondary | ICD-10-CM | POA: Diagnosis not present

## 2018-06-03 ENCOUNTER — Telehealth: Payer: Self-pay

## 2018-06-03 NOTE — Telephone Encounter (Signed)
-----   Message from Doristine Counter, RN sent at 12/03/2017  8:43 AM EST ----- Schedule 6 month follow up US (cirrhosis) dr Havery Moros, due in June 2019

## 2018-06-03 NOTE — Telephone Encounter (Signed)
Left vm for patient to call back about schedule 6 month [place].

## 2018-06-04 ENCOUNTER — Telehealth: Payer: Self-pay

## 2018-06-04 NOTE — Telephone Encounter (Signed)
-----   Message from Doristine Counter, RN sent at 12/03/2017  8:43 AM EST ----- Schedule 6 month follow up US (cirrhosis) dr Havery Moros, due in June 2019

## 2018-06-04 NOTE — Telephone Encounter (Signed)
Mailed letter to contact office about scheduling 6 month f/u ultrasound.

## 2018-09-04 ENCOUNTER — Other Ambulatory Visit: Payer: Self-pay | Admitting: Gastroenterology

## 2019-02-17 ENCOUNTER — Encounter: Payer: Self-pay | Admitting: Gastroenterology

## 2019-04-29 ENCOUNTER — Encounter (HOSPITAL_BASED_OUTPATIENT_CLINIC_OR_DEPARTMENT_OTHER): Payer: Self-pay

## 2019-04-29 ENCOUNTER — Emergency Department (HOSPITAL_BASED_OUTPATIENT_CLINIC_OR_DEPARTMENT_OTHER): Payer: No Typology Code available for payment source

## 2019-04-29 ENCOUNTER — Other Ambulatory Visit: Payer: Self-pay

## 2019-04-29 ENCOUNTER — Emergency Department (HOSPITAL_BASED_OUTPATIENT_CLINIC_OR_DEPARTMENT_OTHER)
Admission: EM | Admit: 2019-04-29 | Discharge: 2019-04-29 | Disposition: A | Discharge status: Home / Self Care | Payer: No Typology Code available for payment source | Attending: Emergency Medicine | Admitting: Emergency Medicine

## 2019-04-29 DIAGNOSIS — J449 Chronic obstructive pulmonary disease, unspecified: Secondary | ICD-10-CM | POA: Insufficient documentation

## 2019-04-29 DIAGNOSIS — Z79899 Other long term (current) drug therapy: Secondary | ICD-10-CM | POA: Diagnosis not present

## 2019-04-29 DIAGNOSIS — R1032 Left lower quadrant pain: Secondary | ICD-10-CM | POA: Diagnosis present

## 2019-04-29 DIAGNOSIS — J45909 Unspecified asthma, uncomplicated: Secondary | ICD-10-CM | POA: Insufficient documentation

## 2019-04-29 DIAGNOSIS — N3091 Cystitis, unspecified with hematuria: Secondary | ICD-10-CM | POA: Insufficient documentation

## 2019-04-29 DIAGNOSIS — F17210 Nicotine dependence, cigarettes, uncomplicated: Secondary | ICD-10-CM | POA: Insufficient documentation

## 2019-04-29 DIAGNOSIS — N3001 Acute cystitis with hematuria: Secondary | ICD-10-CM

## 2019-04-29 DIAGNOSIS — N189 Chronic kidney disease, unspecified: Secondary | ICD-10-CM | POA: Diagnosis not present

## 2019-04-29 LAB — CBC WITH DIFFERENTIAL/PLATELET
Abs Immature Granulocytes: 0.05 10*3/uL (ref 0.00–0.07)
Basophils Absolute: 0.1 10*3/uL (ref 0.0–0.1)
Basophils Relative: 1 %
Eosinophils Absolute: 0.6 10*3/uL — ABNORMAL HIGH (ref 0.0–0.5)
Eosinophils Relative: 4 %
HCT: 47.3 % — ABNORMAL HIGH (ref 36.0–46.0)
Hemoglobin: 15.6 g/dL — ABNORMAL HIGH (ref 12.0–15.0)
Immature Granulocytes: 0 %
Lymphocytes Relative: 32 %
Lymphs Abs: 4.2 10*3/uL — ABNORMAL HIGH (ref 0.7–4.0)
MCH: 29.5 pg (ref 26.0–34.0)
MCHC: 33 g/dL (ref 30.0–36.0)
MCV: 89.6 fL (ref 80.0–100.0)
Monocytes Absolute: 0.8 10*3/uL (ref 0.1–1.0)
Monocytes Relative: 6 %
Neutro Abs: 7.3 10*3/uL (ref 1.7–7.7)
Neutrophils Relative %: 57 %
Platelets: 170 10*3/uL (ref 150–400)
RBC: 5.28 MIL/uL — ABNORMAL HIGH (ref 3.87–5.11)
RDW: 12.6 % (ref 11.5–15.5)
WBC: 12.9 10*3/uL — ABNORMAL HIGH (ref 4.0–10.5)
nRBC: 0 % (ref 0.0–0.2)

## 2019-04-29 LAB — URINALYSIS, MICROSCOPIC (REFLEX): RBC / HPF: >50 RBC/hpf (ref 0–5)

## 2019-04-29 LAB — URINALYSIS, ROUTINE W REFLEX MICROSCOPIC
Glucose, UA: NEGATIVE mg/dL
Ketones, ur: NEGATIVE mg/dL
Leukocytes,Ua: NEGATIVE
Nitrite: NEGATIVE
Protein, ur: 30 mg/dL — AB
Specific Gravity, Urine: >1.03 — ABNORMAL HIGH (ref 1.005–1.030)
pH: 5.5 (ref 5.0–8.0)

## 2019-04-29 LAB — COMPREHENSIVE METABOLIC PANEL
ALT: 29 U/L (ref 0–44)
AST: 41 U/L (ref 15–41)
Albumin: 4 g/dL (ref 3.5–5.0)
Alkaline Phosphatase: 90 U/L (ref 38–126)
Anion gap: 11 (ref 5–15)
BUN: 10 mg/dL (ref 6–20)
CO2: 21 mmol/L — ABNORMAL LOW (ref 22–32)
Calcium: 9 mg/dL (ref 8.9–10.3)
Chloride: 107 mmol/L (ref 98–111)
Creatinine, Ser: 0.6 mg/dL (ref 0.44–1.00)
GFR calc Af Amer: >60 mL/min (ref >60)
GFR calc non Af Amer: >60 mL/min (ref >60)
Glucose, Bld: 130 mg/dL — ABNORMAL HIGH (ref 70–99)
Potassium: 3.5 mmol/L (ref 3.5–5.1)
Sodium: 139 mmol/L (ref 135–145)
Total Bilirubin: 0.9 mg/dL (ref 0.3–1.2)
Total Protein: 8 g/dL (ref 6.5–8.1)

## 2019-04-29 LAB — LIPASE, BLOOD: Lipase: 37 U/L (ref 11–51)

## 2019-04-29 MED ORDER — IOHEXOL 300 MG/ML  SOLN
100.0000 mL | Freq: Once | INTRAMUSCULAR | Status: AC | PRN
  Administered 2019-04-29: 100 mL via INTRAVENOUS

## 2019-04-29 MED ORDER — SODIUM CHLORIDE 0.9 % IV BOLUS
1000.0000 mL | Freq: Once | INTRAVENOUS | Status: AC
  Administered 2019-04-29: 1000 mL via INTRAVENOUS

## 2019-04-29 MED ORDER — KETOROLAC TROMETHAMINE 15 MG/ML IJ SOLN
30.0000 mg | Freq: Once | INTRAMUSCULAR | Status: AC
  Administered 2019-04-29: 30 mg via INTRAVENOUS
  Filled 2019-04-29: qty 2

## 2019-04-29 MED ORDER — CEFPODOXIME PROXETIL 100 MG PO TABS: 100.0000 mg | ORAL_TABLET | Freq: Two times a day (BID) | ORAL | 0 refills | Status: AC

## 2019-04-29 MED ORDER — MAGIC MOUTHWASH: 5.0000 mL | Freq: Three times a day (TID) | ORAL | 0 refills | Status: DC | PRN

## 2019-04-29 MED ORDER — ONDANSETRON HCL 4 MG/2ML IJ SOLN
4.0000 mg | Freq: Once | INTRAMUSCULAR | Status: AC
  Administered 2019-04-29: 4 mg via INTRAVENOUS
  Filled 2019-04-29: qty 2

## 2019-04-29 NOTE — ED Provider Notes (Signed)
[place] EMERGENCY DEPARTMENT Provider Note   CSN: [id number] Arrival date & time: 04/29/19  1444    History   Chief Complaint Chief Complaint  Patient presents with  . Abdominal Pain    HPI Donna Sullivan is a 53 y.o. female.     HPI   Pt is a 53 y/o female with a h/o asthma, CKD, cirrhosis 2/2 NASH, GERD, discoid lupus, seizures, migraines, who presents to the ED today for evaluation of LLQ abd pain that began several hours PTA. Pain has been constant, has worsened since onset. Rates pain 10/10. States pain radiates to the left back. Sxs associated with nausea, urinary urgency, hesitancy. No vomiting or diarrhea. No constipation. Has been passing flatus. No fevers. No vaginal discharge or bleeding. Tried taking her friends percocet PTA and it has not improved sxs.  Past Medical History:  Diagnosis Date  . Allergy   . Anxiety   . Asthma   . Chronic kidney disease 1985   BLOCKED TUBE IN KIDNEY  . Cirrhosis ([place])    secondary to NASH  . Discoid lupus    Dr Ronnald Ramp, dermatologist  . GERD (gastroesophageal reflux disease)   . Headache(784.0)    MIGRAINES  . Insomnia   . Seizures ([place])    EPILEPSY- NO SEIZURES SINCE AGE 10  . Sleep apnea   . Tendonitis     Patient Active Problem List   Diagnosis Date Noted  . Cirrhosis of liver without ascites ([place])   . Benign neoplasm of sigmoid colon   . Lower GI bleed 05/21/2017  . Insomnia 09/06/2016  . COPD with chronic bronchitis ([place]) 09/06/2016  . Tension headache 03/02/2016  . Right lumbar radiculopathy 03/02/2016  . Acute bacterial sinusitis 12/04/2014  . Pelvic mass in female 03/18/2014  . Hematuria, microscopic 03/05/2014  . Left flank pain 06/08/2013  . Routine general medical examination at a health care facility 04/10/2013  . Chest pain, atypical 04/10/2013  . LLQ pain 04/10/2013  . Trapezius muscle spasm 03/29/2013  . Hair loss 11/14/2012  . Depression 11/14/2012  . Pelvic pain in female 09/23/2012   . Fecal incontinence 05/13/2012  . Seasonal allergic rhinitis 05/13/2012  . Panic anxiety syndrome 05/13/2012  . Stress incontinence, female 11/06/2011  . ROM 05/22/2010  . ANKLE PAIN, LEFT 03/09/2009  . LUPUS ERYTHEMATOSUS, DISCOID 01/26/2009    Past Surgical History:  Procedure Laterality Date  . ABDOMINAL HYSTERECTOMY    . ANKLE RECONSTRUCTION  04/2009  . APPENDECTOMY    . CESAREAN SECTION    . CHOLECYSTECTOMY    . COLONOSCOPY WITH PROPOFOL N/A 05/22/2017   Procedure: COLONOSCOPY WITH PROPOFOL;  Surgeon: Manus Gunning, MD;  Location: [facility];  Service: Gastroenterology;  Laterality: N/A;  . DIAGNOSTIC LAPAROSCOPY    . KIDNEY SURGERY     right, straightened tube  . LAPAROSCOPY  09/23/2012   Procedure: LAPAROSCOPY OPERATIVE;  Surgeon: Cheri Fowler, MD;  Location: [facility];  Service: Gynecology;  Laterality: N/A;  . RIGHT OOPHORECTOMY       OB History    Gravida  3   Para  1   Term      Preterm      AB  2   Living  1     SAB      TAB      Ectopic      Multiple      Live Births  Home Medications    Prior to Admission medications   Medication Sig Start Date End Date Taking? Authorizing Provider  beclomethasone (QVAR REDIHALER) 40 MCG/ACT inhaler Inhale into the lungs. 01/23/19  Yes [provider]  cetirizine (ZYRTEC) 10 MG tablet Take by mouth. 01/23/19 01/23/20 Yes [provider]  gabapentin (NEURONTIN) 300 MG capsule Take by mouth. 01/23/19  Yes [provider]  methocarbamol (ROBAXIN) 500 MG tablet TAKE 1 TABLET BY MOUTH THREE TIMES A DAYAS NEEDED 04/24/19  Yes [provider]  albuterol (PROVENTIL) (2.5 MG/3ML) 0.083% nebulizer solution Take 3 mLs (2.5 mg total) by nebulization every 6 (six) hours as needed for wheezing or shortness of breath. 01/17/17   Delos Haring, PA-C  hydrOXYzine (ATARAX/VISTARIL) 25 MG tablet Take 0.5-1 tablets (12.5-25 mg total) by mouth every 8 (eight) hours as  needed for itching. 12/22/17   Doristine Devoid, PA-C  Lido-Capsaicin-Men-Methyl Sal (1ST MEDX-PATCH/ LIDOCAINE EX) Apply topically.    [provider]  omeprazole (PRILOSEC) 40 MG capsule TAKE 1 CAPSULE BY MOUTH DAILY 09/05/18   Armbruster, Carlota Raspberry, MD  QVAR 40 MCG/ACT inhaler Inhale 2 puffs into the lungs 2 (two) times daily. 01/17/17   Delos Haring, PA-C  valACYclovir (VALTREX) 1000 MG tablet Take 1 tablet (1,000 mg total) by mouth 3 (three) times daily. 11/26/17   Armbruster, Carlota Raspberry, MD    Family History Family History  Problem Relation Age of Onset  . Coronary artery disease Mother   . Hypertension Mother   . Hypertension Father   . Colon cancer Maternal Grandfather     Social History Social History   Tobacco Use  . Smoking status: Current Every Day Smoker    Packs/day: 0.50    Types: Cigarettes  . Smokeless tobacco: Never Used  . Tobacco comment: Maybe later  Substance Use Topics  . Alcohol use: Yes    Comment: occ  . Drug use: Yes    Types: Marijuana     Allergies   Symbicort [budesonide-formoterol fumarate]; Tramadol; Amoxicillin-pot clavulanate; Bactrim [sulfamethoxazole-trimethoprim]; Ciprofloxacin hcl; Clindamycin/lincomycin; Cymbalta [duloxetine hcl]; Doxycycline; Keflex [cephalexin]; and Wellbutrin [bupropion]   Review of Systems Review of Systems  Constitutional: Negative for fever.  HENT: Negative for ear pain and sore throat.   Eyes: Negative for visual disturbance.  Respiratory: Negative for cough and shortness of breath.   Cardiovascular: Negative for chest pain.  Gastrointestinal: Positive for abdominal pain and nausea. Negative for blood in stool, constipation, diarrhea and vomiting.  Genitourinary: Positive for flank pain, frequency and urgency. Negative for dysuria, hematuria, vaginal bleeding and vaginal discharge.  Musculoskeletal: Negative for myalgias.  Skin: Negative for rash.  Neurological: Negative for headaches.  All other  systems reviewed and are negative.    Physical Exam Updated Vital Signs BP (!) 110/59 (BP Location: Left Arm)   Pulse (!) 58   Temp 98.6 F (37 C) (Oral)   Resp 16   Ht 5\' 1"  (1.549 m)   Wt 83.6 kg   SpO2 97%   BMI 34.82 kg/m   Physical Exam Vitals signs and nursing note reviewed.  Constitutional:      General: She is not in acute distress.    Appearance: She is well-developed.  HENT:     Head: Normocephalic and atraumatic.  Eyes:     Conjunctiva/sclera: Conjunctivae normal.  Neck:     Musculoskeletal: Neck supple.  Cardiovascular:     Rate and Rhythm: Normal rate and regular rhythm.     Heart sounds: Normal heart  sounds. No murmur.  Pulmonary:     Effort: Pulmonary effort is normal. No respiratory distress.     Breath sounds: Normal breath sounds. No wheezing, rhonchi or rales.  Abdominal:     Palpations: Abdomen is soft.     Tenderness: There is abdominal tenderness in the left upper quadrant and left lower quadrant. There is no right CVA tenderness or left CVA tenderness.     Comments: Voluntary guarding present. No rebound tenderness or rigidity. Normoactive BS.   Skin:    General: Skin is warm and dry.  Neurological:     Mental Status: She is alert.     ED Treatments / Results  Labs (all labs ordered are listed, but only abnormal results are displayed) Labs Reviewed  CBC WITH DIFFERENTIAL/PLATELET - Abnormal; Notable for the following components:      Result Value   WBC 12.9 (*)    RBC 5.28 (*)    Hemoglobin 15.6 (*)    HCT 47.3 (*)    Lymphs Abs 4.2 (*)    Eosinophils Absolute 0.6 (*)    All other components within normal limits  COMPREHENSIVE METABOLIC PANEL - Abnormal; Notable for the following components:   CO2 21 (*)    Glucose, Bld 130 (*)    All other components within normal limits  URINALYSIS, ROUTINE W REFLEX MICROSCOPIC - Abnormal; Notable for the following components:   Color, Urine AMBER (*)    APPearance CLOUDY (*)    Specific  Gravity, Urine >1.030 (*)    Hgb urine dipstick LARGE (*)    Bilirubin Urine SMALL (*)    Protein, ur 30 (*)    All other components within normal limits  URINALYSIS, MICROSCOPIC (REFLEX) - Abnormal; Notable for the following components:   Bacteria, UA MANY (*)    All other components within normal limits  LIPASE, BLOOD    EKG None  Radiology No results found.  Procedures Procedures (including critical care time)  Medications Ordered in ED Medications  ketorolac (TORADOL) 15 MG/ML injection 30 mg (30 mg Intravenous Given 04/29/19 1540)  sodium chloride 0.9 % bolus 1,000 mL ( Intravenous Stopped 04/29/19 1716)  ondansetron (ZOFRAN) injection 4 mg (4 mg Intravenous Given 04/29/19 1540)  iohexol (OMNIPAQUE) 300 MG/ML solution 100 mL (100 mLs Intravenous Contrast Given 04/29/19 1815)     Initial Impression / Assessment and Plan / ED Course  I have reviewed the triage vital signs and the nursing notes.  Pertinent labs & imaging results that were available during my care of the patient were reviewed by me and considered in my medical decision making (see chart for details).   Final Clinical Impressions(s) / ED Diagnoses   Final diagnoses:  None   Pt presenting with LLQ pain radiating to the left flank. H/o diverticulosis, nephrolithiasis.   Afebrile, vs are reassuring.  CBC with mild leukocytosis, on chart review this does appear to be chronic CMP is reassuring Lipase negative UA pending  On re-eval pt states pain much improved after toradol but still has some llq pain with persistent TTP on exam. Nausea has improved as well. Discussed plan to proceed with ct scan to r/o diverticulitis, kidney stone, or other acute cause of her pain. Pt is agreeable to this.   CT abd/pelvis and UA are pending at the time of shift change. Care signed out to [facility], PA-C at shift change with plan to f/u on pending workup. I anticipate d/c if ct scan is negative, repeat abd exam  is  improved, and pt can tolerate PO.   ED Discharge Orders    None       Rodney Booze, PA-C 04/29/19 1836    Gareth Morgan, MD 04/30/19 1556

## 2019-04-29 NOTE — ED Triage Notes (Signed)
Pt c/o LLQ pain, nausea x today- lower back pain "for a long time"-slow gait/grimacing

## 2019-04-29 NOTE — ED Provider Notes (Signed)
Received care of patient at 4 PM from [facility]. 53yo female who presents with left lower quadrant and left flank pain.  CT completed shows cirrhosis, borderline splenomegaly, nonspecific left perirenal edema, however no acute processes.   UA shows blood, many bacteria, 0-5 wbc. Will treat for UTI with vantin. Has allergies to all classes of medication which she reports results in mouth sores but she is able to take certain medications within each class (for example she can take penicillin but not amoxicillin) and has not yet had vantin and feel this is an appropriate choice given no known allergy/reaction to this specific cephalosporin.  Feels improved.   Discussed reasons to return, recommend outpt follow up regarding hematuria which may be infectious however if it continues will need further work up. Patient discharged in stable condition with understanding of reasons to return.    Gareth Morgan, MD 04/30/19 1556

## 2019-04-29 NOTE — ED Notes (Signed)
ED Provider at bedside. 

## 2019-04-29 NOTE — ED Notes (Signed)
Patient transported to CT 

## 2019-04-30 ENCOUNTER — Telehealth (HOSPITAL_BASED_OUTPATIENT_CLINIC_OR_DEPARTMENT_OTHER): Payer: Self-pay | Admitting: Emergency Medicine

## 2019-08-21 ENCOUNTER — Other Ambulatory Visit: Payer: Self-pay | Admitting: Gastroenterology

## 2020-05-03 ENCOUNTER — Encounter: Payer: Self-pay | Admitting: Gastroenterology

## 2020-06-22 ENCOUNTER — Other Ambulatory Visit: Payer: Self-pay | Admitting: Family Medicine

## 2020-06-22 DIAGNOSIS — M5136 Other intervertebral disc degeneration, lumbar region: Secondary | ICD-10-CM

## 2020-08-02 ENCOUNTER — Other Ambulatory Visit: Payer: No Typology Code available for payment source

## 2020-08-25 ENCOUNTER — Ambulatory Visit
Admission: RE | Admit: 2020-08-25 | Discharge: 2020-08-25 | Disposition: A | Discharge status: Home / Self Care | Payer: No Typology Code available for payment source | Source: Ambulatory Visit | Attending: Family Medicine | Admitting: Family Medicine

## 2020-08-25 DIAGNOSIS — M5136 Other intervertebral disc degeneration, lumbar region: Secondary | ICD-10-CM

## 2020-08-25 MED ORDER — GADOBENATE DIMEGLUMINE 529 MG/ML IV SOLN
17.0000 mL | Freq: Once | INTRAVENOUS | Status: AC | PRN
  Administered 2020-08-25: 17 mL via INTRAVENOUS

## 2020-11-10 ENCOUNTER — Other Ambulatory Visit: Payer: Self-pay | Admitting: Family Medicine

## 2020-11-10 DIAGNOSIS — N63 Unspecified lump in unspecified breast: Secondary | ICD-10-CM

## 2020-11-23 ENCOUNTER — Other Ambulatory Visit: Payer: Self-pay | Admitting: Family Medicine

## 2020-11-23 DIAGNOSIS — N6324 Unspecified lump in the left breast, lower inner quadrant: Secondary | ICD-10-CM

## 2020-12-06 ENCOUNTER — Other Ambulatory Visit: Payer: No Typology Code available for payment source

## 2020-12-29 ENCOUNTER — Ambulatory Visit
Admission: RE | Admit: 2020-12-29 | Discharge: 2020-12-29 | Disposition: A | Discharge status: Home / Self Care | Payer: No Typology Code available for payment source | Source: Ambulatory Visit | Attending: Family Medicine | Admitting: Family Medicine

## 2020-12-29 ENCOUNTER — Other Ambulatory Visit: Payer: Self-pay

## 2020-12-29 DIAGNOSIS — N6324 Unspecified lump in the left breast, lower inner quadrant: Secondary | ICD-10-CM

## 2022-03-23 ENCOUNTER — Other Ambulatory Visit: Payer: Self-pay

## 2022-03-23 ENCOUNTER — Ambulatory Visit: Payer: No Typology Code available for payment source | Admitting: Podiatry

## 2022-03-23 DIAGNOSIS — Q6672 Congenital pes cavus, left foot: Secondary | ICD-10-CM | POA: Diagnosis not present

## 2022-03-23 DIAGNOSIS — Q667 Congenital pes cavus, unspecified foot: Secondary | ICD-10-CM

## 2022-03-23 DIAGNOSIS — Q828 Other specified congenital malformations of skin: Secondary | ICD-10-CM | POA: Diagnosis not present

## 2022-03-23 DIAGNOSIS — Q6671 Congenital pes cavus, right foot: Secondary | ICD-10-CM

## 2022-03-26 ENCOUNTER — Telehealth: Payer: Self-pay

## 2022-03-26 NOTE — Telephone Encounter (Signed)
Casts sent to central fabrication °

## 2022-03-27 ENCOUNTER — Encounter: Payer: Self-pay | Admitting: Podiatry

## 2022-03-27 NOTE — Progress Notes (Signed)
?Subjective:  ?Patient ID: Donna Sullivan, female    DOB: 1966-10-11,  MRN: [id number] ? ?Chief Complaint  ?Patient presents with  ? Plantar Warts  ?  Right foot   ? ? ?56 y.o. female presents with the above complaint.  Patient presents with complaint of right submetatarsal 4 porokeratotic lesion.  Patient is admitted for does prefer to walk on her foot is gotten worse.  Hurts when she is otherwise well.  Going barefooted can make it tough.  She has not seen anyone as prior to seeing me.  Pain scale 7 out of 10 dull achy in nature.  She denies any other acute complaints. ? ? ?Review of Systems: Negative except as noted in the HPI. Denies N/V/F/Ch. ? ?Past Medical History:  ?Diagnosis Date  ? Allergy   ? Anxiety   ? Asthma   ? Chronic kidney disease 1985  ? BLOCKED TUBE IN KIDNEY  ? Cirrhosis ([place])   ? secondary to NASH  ? Discoid lupus   ? Dr Ronnald Ramp, dermatologist  ? GERD (gastroesophageal reflux disease)   ? Headache(784.0)   ? MIGRAINES  ? Insomnia   ? Seizures ([place])   ? EPILEPSY- NO SEIZURES SINCE AGE 57  ? Sleep apnea   ? Tendonitis   ? ? ?Current Outpatient Medications:  ?  albuterol (PROVENTIL) (2.5 MG/3ML) 0.083% nebulizer solution, Take 3 mLs (2.5 mg total) by nebulization every 6 (six) hours as needed for wheezing or shortness of breath., Disp: 75 mL, Rfl: 12 ?  beclomethasone (QVAR REDIHALER) 40 MCG/ACT inhaler, Inhale into the lungs., Disp: , Rfl:  ?  gabapentin (NEURONTIN) 300 MG capsule, Take by mouth., Disp: , Rfl:  ?  hydrOXYzine (ATARAX/VISTARIL) 25 MG tablet, Take 0.5-1 tablets (12.5-25 mg total) by mouth every 8 (eight) hours as needed for itching., Disp: 20 tablet, Rfl: 0 ?  Lido-Capsaicin-Men-Methyl Sal (1ST MEDX-PATCH/ LIDOCAINE EX), Apply topically., Disp: , Rfl:  ?  magic mouthwash SOLN, Take 5 mLs by mouth 3 (three) times daily as needed for mouth pain., Disp: 5 mL, Rfl: 0 ?  methocarbamol (ROBAXIN) 500 MG tablet, TAKE 1 TABLET BY MOUTH THREE TIMES A DAYAS NEEDED, Disp: , Rfl:  ?  omeprazole  (PRILOSEC) 40 MG capsule, TAKE 1 CAPSULE BY MOUTH DAILY, Disp: 90 capsule, Rfl: 1 ?  QVAR 40 MCG/ACT inhaler, Inhale 2 puffs into the lungs 2 (two) times daily., Disp: 1 Inhaler, Rfl: 6 ?  valACYclovir (VALTREX) 1000 MG tablet, Take 1 tablet (1,000 mg total) by mouth 3 (three) times daily., Disp: 21 tablet, Rfl: 0 ? ?Social History  ? ?Tobacco Use  ?Smoking Status Every Day  ? Packs/day: 0.50  ? Types: Cigarettes  ?Smokeless Tobacco Never  ?Tobacco Comments  ? Maybe later  ? ? ?Allergies  ?Allergen Reactions  ? Symbicort [Budesonide-Formoterol Fumarate] Other (See Comments)  ?  Caused open sores in mouth   ? Tramadol Shortness Of Breath  ? Amoxicillin-Pot Clavulanate   ?  REACTION: dizziness, fatigue/drowsiness and nausea  ? Bactrim [Sulfamethoxazole-Trimethoprim]   ?  Blister in mouth  ? Ciprofloxacin Hcl   ?  Blisters in mouth  ? Clindamycin/Lincomycin Other (See Comments)  ?  Sores in mouth   ? Cymbalta [Duloxetine Hcl] Other (See Comments)  ?  Per pt makes her "jacked up"  ? Doxycycline   ?  Fatigue, sleepy, worse ?  ? Keflex [Cephalexin]   ?  Blister in mouth  ? Wellbutrin [Bupropion]   ?  Blisters in mouth  ? ?  Objective:  ?There were no vitals filed for this visit. ?There is no height or weight on file to calculate BMI. ?Constitutional Well developed. ?Well nourished.  ?Vascular Dorsalis pedis pulses palpable bilaterally. ?Posterior tibial pulses palpable bilaterally. ?Capillary refill normal to all digits.  ?No cyanosis or clubbing noted. ?Pedal hair growth normal.  ?Neurologic Normal speech. ?Oriented to person, place, and time. ?Epicritic sensation to light touch grossly present bilaterally.  ?Dermatologic Right submetatarsal 5 porokeratotic lesion with central nucleated core/hyperkeratotic lesion.  Pain on palpation to the lesion.  Gait examinationskin is foot structure that is semireducible.  ?Orthopedic: Normal joint ROM without pain or crepitus bilaterally. ?No visible deformities. ?No bony tenderness.   ? ?Radiographs: None ?Assessment:  ? ?1. Porokeratosis   ?2. Pes cavus   ? ?Plan:  ?Patient was evaluated and treated and all questions answered. ? ?Right submetatarsal 5 porokeratosis with underlying pes cavus foot structure ?-All questions and concerns were discussed with the patient in extensive detail.  Given the amount of pain that she is having I believe she will benefit from debridement of the lesion.  Using chisel blade handle the lesion was debrided down to healthy striated tissue.  No complication noted no pinpoint bleeding noted. ? ?Pes cavus ?-I explained to patient the etiology of pes planovalgus and relationship with offloading the submetatarsal 5 skin lesion and various treatment options were discussed.  Given patient foot structure in the setting of offload submet 5 I believe patient will benefit from custom-made orthotics to help control the hindfoot motion support the arch of the foot and take the stress away from plantar fascial.  Patient agrees with the plan like to proceed with orthotics ?-Patient was casted for orthotics ? ? ?No follow-ups on file. ?

## 2022-04-17 ENCOUNTER — Telehealth: Payer: Self-pay | Admitting: Podiatry

## 2022-04-17 NOTE — Telephone Encounter (Signed)
Left vm for pt. To call office to pu orthotics. Insurance has not paid yet, still pending. ?

## 2022-05-04 ENCOUNTER — Ambulatory Visit: Payer: No Typology Code available for payment source | Admitting: Podiatry

## 2022-05-04 ENCOUNTER — Emergency Department (HOSPITAL_BASED_OUTPATIENT_CLINIC_OR_DEPARTMENT_OTHER)
Admission: EM | Admit: 2022-05-04 | Discharge: 2022-05-04 | Disposition: A | Discharge status: Home / Self Care | Payer: No Typology Code available for payment source | Attending: Emergency Medicine | Admitting: Emergency Medicine

## 2022-05-04 ENCOUNTER — Other Ambulatory Visit: Payer: Self-pay

## 2022-05-04 ENCOUNTER — Encounter (HOSPITAL_BASED_OUTPATIENT_CLINIC_OR_DEPARTMENT_OTHER): Payer: Self-pay | Admitting: Emergency Medicine

## 2022-05-04 ENCOUNTER — Ambulatory Visit (INDEPENDENT_AMBULATORY_CARE_PROVIDER_SITE_OTHER): Payer: No Typology Code available for payment source

## 2022-05-04 ENCOUNTER — Emergency Department (HOSPITAL_BASED_OUTPATIENT_CLINIC_OR_DEPARTMENT_OTHER): Payer: No Typology Code available for payment source

## 2022-05-04 DIAGNOSIS — M79631 Pain in right forearm: Secondary | ICD-10-CM | POA: Insufficient documentation

## 2022-05-04 DIAGNOSIS — X509XXA Other and unspecified overexertion or strenuous movements or postures, initial encounter: Secondary | ICD-10-CM | POA: Insufficient documentation

## 2022-05-04 DIAGNOSIS — M216X1 Other acquired deformities of right foot: Secondary | ICD-10-CM

## 2022-05-04 DIAGNOSIS — M25521 Pain in right elbow: Secondary | ICD-10-CM | POA: Insufficient documentation

## 2022-05-04 DIAGNOSIS — Z01818 Encounter for other preprocedural examination: Secondary | ICD-10-CM

## 2022-05-04 DIAGNOSIS — Q828 Other specified congenital malformations of skin: Secondary | ICD-10-CM

## 2022-05-04 NOTE — ED Notes (Addendum)
Pt discharged to home. Discharge instructions have been discussed with patient and/or family members. Pt verbally acknowledges understanding d/c instructions, and endorses comprehension to checkout at registration before leaving.  °

## 2022-05-04 NOTE — ED Provider Notes (Signed)
?[place] EMERGENCY DEPARTMENT ?Provider Note ? ? ?CSN: [id number] ?Arrival date & time: 05/04/22  1412 ? ?  ? ?History ? ?Chief Complaint  ?Patient presents with  ? Arm Pain  ? ? ?Donna Sullivan is a 56 y.o. female.  She is right-hand dominant.  Complaining of right elbow and forearm pain after pulling a heavy trash can and felt a pop sensation in her elbow.  Worse with movement.  Has been taking Tylenol without any improvement.  No numbness or weakness.  No prior history of same. ? ?The history is provided by the patient.  ?Arm Pain ?This is a new problem. The current episode started more than 1 week ago. The problem occurs constantly. The problem has not changed since onset.Pertinent negatives include no chest pain, no abdominal pain, no headaches and no shortness of breath. The symptoms are aggravated by bending and twisting. Nothing relieves the symptoms. She has tried acetaminophen for the symptoms. The treatment provided no relief.  ? ?  ? ?Home Medications ?Prior to Admission medications   ?Medication Sig Start Date End Date Taking? Authorizing Provider  ?albuterol (PROVENTIL) (2.5 MG/3ML) 0.083% nebulizer solution Take 3 mLs (2.5 mg total) by nebulization every 6 (six) hours as needed for wheezing or shortness of breath. 01/17/17   Delos Haring, PA-C  ?beclomethasone (QVAR REDIHALER) 40 MCG/ACT inhaler Inhale into the lungs. 01/23/19   [provider]  ?gabapentin (NEURONTIN) 300 MG capsule Take by mouth. 01/23/19   [provider]  ?hydrOXYzine (ATARAX/VISTARIL) 25 MG tablet Take 0.5-1 tablets (12.5-25 mg total) by mouth every 8 (eight) hours as needed for itching. 12/22/17   Doristine Devoid, PA-C  ?Lido-Capsaicin-Men-Methyl Sal (1ST MEDX-PATCH/ LIDOCAINE EX) Apply topically.    [provider]  ?magic mouthwash SOLN Take 5 mLs by mouth 3 (three) times daily as needed for mouth pain. 04/29/19   Gareth Morgan, MD  ?methocarbamol (ROBAXIN) 500 MG tablet TAKE 1 TABLET  BY MOUTH THREE TIMES A DAYAS NEEDED 04/24/19   [provider]  ?omeprazole (PRILOSEC) 40 MG capsule TAKE 1 CAPSULE BY MOUTH DAILY 09/05/18   Armbruster, Carlota Raspberry, MD  ?QVAR 40 MCG/ACT inhaler Inhale 2 puffs into the lungs 2 (two) times daily. 01/17/17   Delos Haring, PA-C  ?valACYclovir (VALTREX) 1000 MG tablet Take 1 tablet (1,000 mg total) by mouth 3 (three) times daily. 11/26/17   Armbruster, Carlota Raspberry, MD  ?   ? ?Allergies    ?Symbicort [budesonide-formoterol fumarate], Tramadol, Amoxicillin-pot clavulanate, Bactrim [sulfamethoxazole-trimethoprim], Ciprofloxacin hcl, Clindamycin/lincomycin, Cymbalta [duloxetine hcl], Doxycycline, Keflex [cephalexin], and Wellbutrin [bupropion]   ? ?Review of Systems   ?Review of Systems  ?Constitutional:  Negative for fever.  ?Respiratory:  Negative for shortness of breath.   ?Cardiovascular:  Negative for chest pain.  ?Gastrointestinal:  Negative for abdominal pain.  ?Skin:  Negative for wound.  ?Neurological:  Negative for weakness, numbness and headaches.  ? ?Physical Exam ?Updated Vital Signs ?BP (!) 143/67   Pulse 61   Temp 97.9 ?F (36.6 ?C) (Oral)   Resp 18   Ht '5\' 1"'$  (1.549 m)   Wt 77.1 kg   SpO2 98%   BMI 32.12 kg/m?  ?Physical Exam ?Constitutional:   ?   Appearance: Normal appearance. She is well-developed.  ?HENT:  ?   Head: Normocephalic and atraumatic.  ?Eyes:  ?   Conjunctiva/sclera: Conjunctivae normal.  ?Musculoskeletal:     ?   General: Tenderness present. No deformity. Normal range of motion.  ?  Cervical back: Neck supple.  ?   Comments: She is tender over her lateral condyle and and radial head.  She has pain with supination pronation.  Pain with range of motion at elbow.  Biceps tendon intact.  Distal radial pulse 2+.  Radial ulnar and median sensation and motor intact.  ?Skin: ?   General: Skin is warm and dry.  ?Neurological:  ?   General: No focal deficit present.  ?   Mental Status: She is alert.  ?   GCS: GCS eye subscore is 4. GCS verbal  subscore is 5. GCS motor subscore is 6.  ?   Sensory: No sensory deficit.  ?   Motor: No weakness.  ? ? ?ED Results / Procedures / Treatments   ?Labs ?(all labs ordered are listed, but only abnormal results are displayed) ?Labs Reviewed - No data to display ? ?EKG ?None ? ?Radiology ?DG Elbow Complete Right ? ?Result Date: 05/04/2022 ?CLINICAL DATA:  Elbow pain EXAM: RIGHT ELBOW - COMPLETE 3+ VIEW COMPARISON:  None Available. FINDINGS: There is no evidence of fracture, dislocation, or joint effusion. There is no evidence of arthropathy or other focal bone abnormality. Soft tissues are unremarkable. IMPRESSION: Negative. Electronically Signed   By: Davina Poke D.O.   On: 05/04/2022 15:12   ? ?Procedures ?Procedures  ? ? ?Medications Ordered in ED ?Medications - No data to display ? ?ED Course/ Medical Decision Making/ A&P ?  ?                        ?Medical Decision Making ?Amount and/or Complexity of Data Reviewed ?Radiology: ordered. ? ?This patient complains of right elbow pain; this involves an extensive number of treatment ?Options and is a complaint that carries with it a high risk of complications and ?morbidity. The differential includes fracture, dislocation, tendonitis, muscle tear ?I ordered imaging studies which included x-ray right elbow and I independently ?   visualized and interpreted imaging which showed no acute findings ?Previous records obtained and reviewed in epic no recent admissions ?Social determinants considered, no significant barriers ?Critical Interventions: None ? ?After the interventions stated above, I reevaluated the patient and found patient be neurovascularly intact ?Admission and further testing considered, no indications for admission or further work-up at this time.  Given contact information for outpatient orthopedic/hand follow-up. ? ? ? ? ? ? ? ? ? ?Final Clinical Impression(s) / ED Diagnoses ?Final diagnoses:  ?Right elbow pain  ? ? ?Rx / DC Orders ?ED Discharge Orders    ? ? None  ? ?  ? ? ?  ?Hayden Rasmussen, MD ?05/05/22 [phone] ? ?

## 2022-05-04 NOTE — ED Triage Notes (Signed)
Pt arrives pov, steady gait clo right arm pain "for couple weeks". Denies CP or shob. Felt "pop" when pulling garbage can.Marland Kitchen ?

## 2022-05-04 NOTE — Discharge Instructions (Signed)
You were seen in the emergency department for evaluation of right elbow pain.  You had an x-ray that did not show any obvious fracture or dislocation.  Please continue Tylenol as needed for pain and ice to the area.  Contact Dr. Madelynn Done office for follow-up.  Gentle range of motion. ?

## 2022-05-08 ENCOUNTER — Encounter: Payer: Self-pay | Admitting: Podiatry

## 2022-05-08 NOTE — Progress Notes (Signed)
?Subjective:  ?Patient ID: Donna Sullivan, female    DOB: May 07, 1966,  MRN: [id number] ? ?Chief Complaint  ?Patient presents with  ? porokeratosis   ? ? ?56 y.o. female presents with the above complaint.  Patient presents for follow-up to right submetatarsal 5 porokeratotic lesion with underlying plantarflexed metatarsal.  Patient states painful to touch painful to walk and has come back again.  She is tried shoe gear modification padding protecting offloading of which has helped.  She would like to discuss surgical options at this time. ? ? ?Review of Systems: Negative except as noted in the HPI. Denies N/V/F/Ch. ? ?Past Medical History:  ?Diagnosis Date  ? Allergy   ? Anxiety   ? Asthma   ? Chronic kidney disease 1985  ? BLOCKED TUBE IN KIDNEY  ? Cirrhosis ([place])   ? secondary to NASH  ? Discoid lupus   ? Dr Ronnald Ramp, dermatologist  ? GERD (gastroesophageal reflux disease)   ? Headache(784.0)   ? MIGRAINES  ? Insomnia   ? Seizures ([place])   ? EPILEPSY- NO SEIZURES SINCE AGE 32  ? Sleep apnea   ? Tendonitis   ? ? ?Current Outpatient Medications:  ?  albuterol (PROVENTIL) (2.5 MG/3ML) 0.083% nebulizer solution, Take 3 mLs (2.5 mg total) by nebulization every 6 (six) hours as needed for wheezing or shortness of breath., Disp: 75 mL, Rfl: 12 ?  beclomethasone (QVAR REDIHALER) 40 MCG/ACT inhaler, Inhale into the lungs., Disp: , Rfl:  ?  gabapentin (NEURONTIN) 300 MG capsule, Take by mouth., Disp: , Rfl:  ?  hydrOXYzine (ATARAX/VISTARIL) 25 MG tablet, Take 0.5-1 tablets (12.5-25 mg total) by mouth every 8 (eight) hours as needed for itching., Disp: 20 tablet, Rfl: 0 ?  Lido-Capsaicin-Men-Methyl Sal (1ST MEDX-PATCH/ LIDOCAINE EX), Apply topically., Disp: , Rfl:  ?  magic mouthwash SOLN, Take 5 mLs by mouth 3 (three) times daily as needed for mouth pain., Disp: 5 mL, Rfl: 0 ?  methocarbamol (ROBAXIN) 500 MG tablet, TAKE 1 TABLET BY MOUTH THREE TIMES A DAYAS NEEDED, Disp: , Rfl:  ?  omeprazole (PRILOSEC) 40 MG capsule, TAKE 1  CAPSULE BY MOUTH DAILY, Disp: 90 capsule, Rfl: 1 ?  QVAR 40 MCG/ACT inhaler, Inhale 2 puffs into the lungs 2 (two) times daily., Disp: 1 Inhaler, Rfl: 6 ?  valACYclovir (VALTREX) 1000 MG tablet, Take 1 tablet (1,000 mg total) by mouth 3 (three) times daily., Disp: 21 tablet, Rfl: 0 ? ?Social History  ? ?Tobacco Use  ?Smoking Status Every Day  ? Packs/day: 0.50  ? Types: Cigarettes  ?Smokeless Tobacco Never  ?Tobacco Comments  ? Maybe later  ? ? ?Allergies  ?Allergen Reactions  ? Symbicort [Budesonide-Formoterol Fumarate] Other (See Comments)  ?  Caused open sores in mouth   ? Tramadol Shortness Of Breath  ? Amoxicillin-Pot Clavulanate   ?  REACTION: dizziness, fatigue/drowsiness and nausea  ? Bactrim [Sulfamethoxazole-Trimethoprim]   ?  Blister in mouth  ? Ciprofloxacin Hcl   ?  Blisters in mouth  ? Clindamycin/Lincomycin Other (See Comments)  ?  Sores in mouth   ? Cymbalta [Duloxetine Hcl] Other (See Comments)  ?  Per pt makes her "jacked up"  ? Doxycycline   ?  Fatigue, sleepy, worse ?  ? Keflex [Cephalexin]   ?  Blister in mouth  ? Wellbutrin [Bupropion]   ?  Blisters in mouth  ? ?Objective:  ?There were no vitals filed for this visit. ?There is no height or weight on file to calculate BMI. ?  Constitutional Well developed. ?Well nourished.  ?Vascular Dorsalis pedis pulses palpable bilaterally. ?Posterior tibial pulses palpable bilaterally. ?Capillary refill normal to all digits.  ?No cyanosis or clubbing noted. ?Pedal hair growth normal.  ?Neurologic Normal speech. ?Oriented to person, place, and time. ?Epicritic sensation to light touch grossly present bilaterally.  ?Dermatologic Right submetatarsal 5 porokeratotic lesion with central nucleated core/hyperkeratotic lesion.  Pain on palpation to the lesion.  Gait examinationskin is foot structure that is semireducible.  There is a plantarflexed fifth metatarsal that is noted putting excessive pressure to submetatarsal 5 lesion.  ?Orthopedic: Normal joint ROM  without pain or crepitus bilaterally. ?No visible deformities. ?No bony tenderness.  ? ?Radiographs: None ?Assessment:  ? ?1. Plantar flexed metatarsal, right   ?2. Encounter for preoperative examination for general surgical procedure   ? ?Plan:  ?Patient was evaluated and treated and all questions answered. ? ?Right submetatarsal 5 porokeratosis with underlying pes cavus foot structure/plantarflexed metatarsal ?-All questions and concerns were discussed with the patient in extensive detail.  ?-Clinically her pain has come back and is causing her a lot of pain.  She would like to discuss surgical options at that time.  She has tried offloading padding protecting none of which has helped.  She would like to discuss treatment options for this.  She would like to discuss surgical options. ?-I believe she will benefit from a floating osteotomy of the fifth metatarsal.  I discussed this my preoperative intraoperative postoperative plan in extensive detail she states understand like to proceed with surgery. ?-Informed surgical risk consent was reviewed and read aloud to the patient.  I reviewed the films.  I have discussed my findings with the patient in great detail.  I have discussed all risks including but not limited to infection, stiffness, scarring, limp, disability, deformity, damage to blood vessels and nerves, numbness, poor healing, need for braces, arthritis, chronic pain, amputation, death.  All benefits and realistic expectations discussed in great detail.  I have made no promises as to the outcome.  I have provided realistic expectations.  I have offered the patient a 2nd opinion, which they have declined and assured me they preferred to proceed despite the risks ?Plantar metatarsal right ? ?Pes cavus ?-I explained to patient the etiology of pes planovalgus and relationship with offloading the submetatarsal 5 skin lesion and various treatment options were discussed.  Given patient foot structure in the  setting of offload submet 5 I believe patient will benefit from custom-made orthotics to help control the hindfoot motion support the arch of the foot and take the stress away from plantar fascial.  Patient agrees with the plan like to proceed with orthotics ?-Orthotics were dispensed and are functioning well. ? ? ?No follow-ups on file. ?

## 2022-05-09 ENCOUNTER — Telehealth: Payer: Self-pay | Admitting: Urology

## 2022-05-09 NOTE — Telephone Encounter (Signed)
DOS - 06/04/22 ? ?METATARSAL OSTEOTOMY 5TH RIGHT --- [phone] ? ?[employer] EFFECTIVE DATE - 09/30/18 ? ?SPOKE WITH CARLA WITH [employer] AND SHE STATED THAT FOR CPT CODE 61848 NO PRIOR AUTH IS REQUIRED.  ? ?REF # CARLA R. 05/09/22 AT 8:39 AM EST  ?

## 2022-06-04 ENCOUNTER — Encounter: Payer: Self-pay | Admitting: Podiatry

## 2022-06-04 ENCOUNTER — Other Ambulatory Visit: Payer: Self-pay | Admitting: Podiatry

## 2022-06-04 DIAGNOSIS — M21541 Acquired clubfoot, right foot: Secondary | ICD-10-CM | POA: Diagnosis not present

## 2022-06-04 MED ORDER — IBUPROFEN 800 MG PO TABS: 800.0000 mg | ORAL_TABLET | Freq: Four times a day (QID) | ORAL | 1 refills | Status: DC | PRN

## 2022-06-04 MED ORDER — KETOROLAC TROMETHAMINE 10 MG PO TABS: 10.0000 mg | ORAL_TABLET | Freq: Four times a day (QID) | ORAL | 0 refills | Status: DC | PRN

## 2022-06-04 MED ORDER — OXYCODONE-ACETAMINOPHEN 5-325 MG PO TABS: 1.0000 | ORAL_TABLET | ORAL | 0 refills | Status: DC | PRN

## 2022-06-04 MED ORDER — OXYCODONE HCL 5 MG PO TABS: 5.0000 mg | ORAL_TABLET | ORAL | 0 refills | Status: DC | PRN

## 2022-06-13 ENCOUNTER — Telehealth: Payer: Self-pay | Admitting: *Deleted

## 2022-06-13 ENCOUNTER — Ambulatory Visit (INDEPENDENT_AMBULATORY_CARE_PROVIDER_SITE_OTHER): Payer: No Typology Code available for payment source | Admitting: Podiatry

## 2022-06-13 ENCOUNTER — Ambulatory Visit (INDEPENDENT_AMBULATORY_CARE_PROVIDER_SITE_OTHER): Payer: No Typology Code available for payment source

## 2022-06-13 DIAGNOSIS — Z9889 Other specified postprocedural states: Secondary | ICD-10-CM

## 2022-06-13 DIAGNOSIS — M216X1 Other acquired deformities of right foot: Secondary | ICD-10-CM | POA: Diagnosis not present

## 2022-06-13 MED ORDER — KETOROLAC TROMETHAMINE 10 MG PO TABS: 10.0000 mg | ORAL_TABLET | Freq: Four times a day (QID) | ORAL | 0 refills | Status: DC | PRN

## 2022-06-13 MED ORDER — OXYCODONE-ACETAMINOPHEN 5-325 MG PO TABS: 1.0000 | ORAL_TABLET | ORAL | 0 refills | Status: DC | PRN

## 2022-06-13 NOTE — Telephone Encounter (Signed)
Patient is asking for clarification on taking the percocet,5-325 mg and the Toradol-10 mg,has an autoimmune disorder and is concerned about taking both together.Can she rotate the two every other day? Please advise.

## 2022-06-15 NOTE — Progress Notes (Signed)
Subjective:  Patient ID: Donna Sullivan, female    DOB: [birth year],  MRN: [id number]  Chief Complaint  Patient presents with   Routine Post Op    DOS: 06/04/2022 Procedure: Right fifth metatarsal osteotomy floating  56 y.o. female returns for post-op check.  Patient states she is doing well.  Ambulating with surgical shoe she denies any other acute complaints.  Minimal pain.  Bandages clean dry and intact  Review of Systems: Negative except as noted in the HPI. Denies N/V/F/Ch.  Past Medical History:  Diagnosis Date   Allergy    Anxiety    Asthma    Chronic kidney disease 1985   BLOCKED TUBE IN KIDNEY   Cirrhosis ([place])    secondary to NASH   Discoid lupus    Dr Ronnald Ramp, dermatologist   GERD (gastroesophageal reflux disease)    Headache(784.0)    MIGRAINES   Insomnia    Seizures ([place])    EPILEPSY- NO SEIZURES SINCE AGE 54   Sleep apnea    Tendonitis     Current Outpatient Medications:    albuterol (PROVENTIL) (2.5 MG/3ML) 0.083% nebulizer solution, Take 3 mLs (2.5 mg total) by nebulization every 6 (six) hours as needed for wheezing or shortness of breath., Disp: 75 mL, Rfl: 12   beclomethasone (QVAR REDIHALER) 40 MCG/ACT inhaler, Inhale into the lungs., Disp: , Rfl:    gabapentin (NEURONTIN) 300 MG capsule, Take by mouth., Disp: , Rfl:    hydrOXYzine (ATARAX/VISTARIL) 25 MG tablet, Take 0.5-1 tablets (12.5-25 mg total) by mouth every 8 (eight) hours as needed for itching., Disp: 20 tablet, Rfl: 0   ibuprofen (ADVIL) 800 MG tablet, Take 1 tablet (800 mg total) by mouth every 6 (six) hours as needed., Disp: 60 tablet, Rfl: 1   ketorolac (TORADOL) 10 MG tablet, Take 1 tablet (10 mg total) by mouth every 6 (six) hours as needed., Disp: 20 tablet, Rfl: 0   Lido-Capsaicin-Men-Methyl Sal (1ST MEDX-PATCH/ LIDOCAINE EX), Apply topically., Disp: , Rfl:    magic mouthwash SOLN, Take 5 mLs by mouth 3 (three) times daily as needed for mouth pain., Disp: 5 mL, Rfl: 0   methocarbamol (ROBAXIN)  500 MG tablet, TAKE 1 TABLET BY MOUTH THREE TIMES A DAYAS NEEDED, Disp: , Rfl:    omeprazole (PRILOSEC) 40 MG capsule, TAKE 1 CAPSULE BY MOUTH DAILY, Disp: 90 capsule, Rfl: 1   oxyCODONE (ROXICODONE) 5 MG immediate release tablet, Take 1 tablet (5 mg total) by mouth every 4 (four) hours as needed for severe pain., Disp: 30 tablet, Rfl: 0   oxyCODONE-acetaminophen (PERCOCET) 5-325 MG tablet, Take 1 tablet by mouth every 4 (four) hours as needed for severe pain., Disp: 30 tablet, Rfl: 0   QVAR 40 MCG/ACT inhaler, Inhale 2 puffs into the lungs 2 (two) times daily., Disp: 1 Inhaler, Rfl: 6   valACYclovir (VALTREX) 1000 MG tablet, Take 1 tablet (1,000 mg total) by mouth 3 (three) times daily., Disp: 21 tablet, Rfl: 0  Social History   Tobacco Use  Smoking Status Every Day   Packs/day: 0.50   Types: Cigarettes  Smokeless Tobacco Never  Tobacco Comments   Maybe later    Allergies  Allergen Reactions   Symbicort [Budesonide-Formoterol Fumarate] Other (See Comments)    Caused open sores in mouth    Tramadol Shortness Of Breath   Amoxicillin-Pot Clavulanate     REACTION: dizziness, fatigue/drowsiness and nausea   Bactrim [Sulfamethoxazole-Trimethoprim]     Blister in mouth   Ciprofloxacin Hcl  Blisters in mouth   Clindamycin/Lincomycin Other (See Comments)    Sores in mouth    Cymbalta [Duloxetine Hcl] Other (See Comments)    Per pt makes her "jacked up"   Doxycycline     Fatigue, sleepy, worse    Keflex [Cephalexin]     Blister in mouth   Wellbutrin [Bupropion]     Blisters in mouth   Objective:  There were no vitals filed for this visit. There is no height or weight on file to calculate BMI. Constitutional Well developed. Well nourished.  Vascular Foot warm and well perfused. Capillary refill normal to all digits.   Neurologic Normal speech. Oriented to person, place, and time. Epicritic sensation to light touch grossly present bilaterally.  Dermatologic Skin healing  well without signs of infection. Skin edges well coapted without signs of infection.  Orthopedic: Tenderness to palpation noted about the surgical site.   Radiographs: 3 views of skeletally mature adult right foot: Osteotomy noted to the fifth metatarsal appears to be in good position alignment. Assessment:   1. Plantar flexed metatarsal, right   2. Status post foot surgery    Plan:  Patient was evaluated and treated and all questions answered.  S/p foot surgery right -Progressing as expected post-operatively. -XR: See above -WB Status: Weightbearing as tolerated in surgical shoe -Sutures: Intact.  No clinical signs of Deis is noted no complication noted. -Medications: None -Foot redressed.  No follow-ups on file.

## 2022-06-18 ENCOUNTER — Telehealth: Payer: Self-pay | Admitting: *Deleted

## 2022-06-18 NOTE — Telephone Encounter (Signed)
Patient is calling because her foot started bleeding w/ pus , first noticed today, please advise. Returned the call back to patient for additional information on post op foot, no answer, left a voice message for callback.

## 2022-06-19 NOTE — Telephone Encounter (Signed)
Patient is calling again because her post surgical foot is still a little bleeding with pus noticed, pain is worse today, very concerned that it may be infected. Please advise.

## 2022-06-21 ENCOUNTER — Ambulatory Visit: Payer: No Typology Code available for payment source | Admitting: Podiatry

## 2022-06-21 ENCOUNTER — Telehealth: Payer: Self-pay | Admitting: Podiatry

## 2022-06-21 NOTE — Telephone Encounter (Signed)
Pt called me stating she probably could not make her appt today because of weather.She said she could possibly be here by 430 and I told her our policy. She then called back and rescheduled with Mrs Romie Minus to tomorrow.

## 2022-06-21 NOTE — Telephone Encounter (Signed)
Pt called and I reviewed the not and Dr Posey Pronto would like pt to come in and I have scheduled her to come in today 6.22.2023.Marland Kitchen

## 2022-06-22 ENCOUNTER — Ambulatory Visit (INDEPENDENT_AMBULATORY_CARE_PROVIDER_SITE_OTHER): Payer: No Typology Code available for payment source | Admitting: Podiatry

## 2022-06-22 DIAGNOSIS — M216X1 Other acquired deformities of right foot: Secondary | ICD-10-CM

## 2022-06-22 DIAGNOSIS — Z9889 Other specified postprocedural states: Secondary | ICD-10-CM

## 2022-06-22 MED ORDER — OXYCODONE HCL 5 MG PO TABS: 5.0000 mg | ORAL_TABLET | ORAL | 0 refills | Status: DC | PRN

## 2022-06-27 ENCOUNTER — Ambulatory Visit (INDEPENDENT_AMBULATORY_CARE_PROVIDER_SITE_OTHER): Payer: No Typology Code available for payment source | Admitting: Podiatry

## 2022-06-27 DIAGNOSIS — M216X1 Other acquired deformities of right foot: Secondary | ICD-10-CM

## 2022-06-27 DIAGNOSIS — Z9889 Other specified postprocedural states: Secondary | ICD-10-CM

## 2022-07-04 NOTE — Progress Notes (Signed)
Subjective:  Patient ID: Donna Sullivan, female    DOB: Nov 07, 1966,  MRN: [id number]  Chief Complaint  Patient presents with   Routine Post Op    POV #2 DOS 06/04/2022 RT PLANTAR 5TH FLOATING OSTEOTOMY    DOS: 06/04/2022 Procedure: Right fifth metatarsal osteotomy floating  56 y.o. female returns for post-op check.  Patient states she is doing well.  She states she is doing okay.  The pain is much better controlled.  Sutures are intact.  Dressings clean dry and intact  Review of Systems: Negative except as noted in the HPI. Denies N/V/F/Ch.  Past Medical History:  Diagnosis Date   Allergy    Anxiety    Asthma    Chronic kidney disease 1985   BLOCKED TUBE IN KIDNEY   Cirrhosis ([place])    secondary to NASH   Discoid lupus    Dr Ronnald Ramp, dermatologist   GERD (gastroesophageal reflux disease)    Headache(784.0)    MIGRAINES   Insomnia    Seizures ([place])    EPILEPSY- NO SEIZURES SINCE AGE 58   Sleep apnea    Tendonitis     Current Outpatient Medications:    albuterol (PROVENTIL) (2.5 MG/3ML) 0.083% nebulizer solution, Take 3 mLs (2.5 mg total) by nebulization every 6 (six) hours as needed for wheezing or shortness of breath., Disp: 75 mL, Rfl: 12   beclomethasone (QVAR REDIHALER) 40 MCG/ACT inhaler, Inhale into the lungs., Disp: , Rfl:    gabapentin (NEURONTIN) 300 MG capsule, Take by mouth., Disp: , Rfl:    hydrOXYzine (ATARAX/VISTARIL) 25 MG tablet, Take 0.5-1 tablets (12.5-25 mg total) by mouth every 8 (eight) hours as needed for itching., Disp: 20 tablet, Rfl: 0   ibuprofen (ADVIL) 800 MG tablet, Take 1 tablet (800 mg total) by mouth every 6 (six) hours as needed., Disp: 60 tablet, Rfl: 1   ketorolac (TORADOL) 10 MG tablet, Take 1 tablet (10 mg total) by mouth every 6 (six) hours as needed., Disp: 20 tablet, Rfl: 0   Lido-Capsaicin-Men-Methyl Sal (1ST MEDX-PATCH/ LIDOCAINE EX), Apply topically., Disp: , Rfl:    magic mouthwash SOLN, Take 5 mLs by mouth 3 (three) times daily as needed  for mouth pain., Disp: 5 mL, Rfl: 0   methocarbamol (ROBAXIN) 500 MG tablet, TAKE 1 TABLET BY MOUTH THREE TIMES A DAYAS NEEDED, Disp: , Rfl:    omeprazole (PRILOSEC) 40 MG capsule, TAKE 1 CAPSULE BY MOUTH DAILY, Disp: 90 capsule, Rfl: 1   oxyCODONE (ROXICODONE) 5 MG immediate release tablet, Take 1 tablet (5 mg total) by mouth every 4 (four) hours as needed for severe pain., Disp: 30 tablet, Rfl: 0   oxyCODONE (ROXICODONE) 5 MG immediate release tablet, Take 1 tablet (5 mg total) by mouth every 4 (four) hours as needed for severe pain., Disp: 30 tablet, Rfl: 0   oxyCODONE-acetaminophen (PERCOCET) 5-325 MG tablet, Take 1 tablet by mouth every 4 (four) hours as needed for severe pain., Disp: 30 tablet, Rfl: 0   QVAR 40 MCG/ACT inhaler, Inhale 2 puffs into the lungs 2 (two) times daily., Disp: 1 Inhaler, Rfl: 6   valACYclovir (VALTREX) 1000 MG tablet, Take 1 tablet (1,000 mg total) by mouth 3 (three) times daily., Disp: 21 tablet, Rfl: 0  Social History   Tobacco Use  Smoking Status Every Day   Packs/day: 0.50   Types: Cigarettes  Smokeless Tobacco Never  Tobacco Comments   Maybe later    Allergies  Allergen Reactions   Symbicort [Budesonide-Formoterol Fumarate] Other (See  Comments)    Caused open sores in mouth    Tramadol Shortness Of Breath   Amoxicillin-Pot Clavulanate     REACTION: dizziness, fatigue/drowsiness and nausea   Bactrim [Sulfamethoxazole-Trimethoprim]     Blister in mouth   Ciprofloxacin Hcl     Blisters in mouth   Clindamycin/Lincomycin Other (See Comments)    Sores in mouth    Cymbalta [Duloxetine Hcl] Other (See Comments)    Per pt makes her "jacked up"   Doxycycline     Fatigue, sleepy, worse    Keflex [Cephalexin]     Blister in mouth   Wellbutrin [Bupropion]     Blisters in mouth   Objective:  There were no vitals filed for this visit. There is no height or weight on file to calculate BMI. Constitutional Well developed. Well nourished.  Vascular  Foot warm and well perfused. Capillary refill normal to all digits.   Neurologic Normal speech. Oriented to person, place, and time. Epicritic sensation to light touch grossly present bilaterally.  Dermatologic Skin well reepithelialized.  No clinical signs of dehiscence or complication noted good correction alignment noted.  Orthopedic: Mild tenderness to palpation noted about the surgical site.   Radiographs: 3 views of skeletally mature adult right foot: Osteotomy noted to the fifth metatarsal appears to be in good position alignment. Assessment:   1. Plantar flexed metatarsal, right   2. Status post foot surgery     Plan:  Patient was evaluated and treated and all questions answered.  S/p foot surgery right -Progressing as expected post-operatively. -XR: See above -WB Status: Weightbearing as tolerated in surgical shoe -Sutures: Removed.  No clinical signs of Deis is noted no complication noted. -Medications: None -Foot redressed.  No follow-ups on file.

## 2022-07-05 ENCOUNTER — Telehealth: Payer: Self-pay | Admitting: Podiatry

## 2022-07-05 MED ORDER — KETOROLAC TROMETHAMINE 10 MG PO TABS: 10.0000 mg | ORAL_TABLET | Freq: Four times a day (QID) | ORAL | 0 refills | Status: DC | PRN

## 2022-07-05 NOTE — Telephone Encounter (Signed)
Pt called requesting refill on: ketorolac (TORADOL) 10 MG tablet  Please advise.

## 2022-07-05 NOTE — Addendum Note (Signed)
Addended by: Boneta Lucks on: 07/05/2022 04:06 PM   Modules accepted: Orders

## 2022-07-27 ENCOUNTER — Encounter: Payer: No Typology Code available for payment source | Admitting: Podiatry

## 2022-08-01 ENCOUNTER — Encounter: Payer: No Typology Code available for payment source | Admitting: Podiatry

## 2022-08-08 ENCOUNTER — Telehealth: Payer: Self-pay | Admitting: Podiatry

## 2022-08-08 MED ORDER — KETOROLAC TROMETHAMINE 10 MG PO TABS: 10.0000 mg | ORAL_TABLET | Freq: Four times a day (QID) | ORAL | 0 refills | Status: DC | PRN

## 2022-08-08 NOTE — Telephone Encounter (Signed)
Pt called and requested a refill on RX for Toradol. She uses [facility] on [address]. Please advise.

## 2022-08-24 ENCOUNTER — Encounter: Payer: No Typology Code available for payment source | Admitting: Podiatry

## 2022-09-06 ENCOUNTER — Encounter: Payer: No Typology Code available for payment source | Admitting: Podiatry

## 2022-09-07 ENCOUNTER — Other Ambulatory Visit: Payer: Self-pay | Admitting: Podiatry

## 2022-09-19 ENCOUNTER — Emergency Department (HOSPITAL_BASED_OUTPATIENT_CLINIC_OR_DEPARTMENT_OTHER): Payer: No Typology Code available for payment source

## 2022-09-19 ENCOUNTER — Ambulatory Visit
Admission: EM | Admit: 2022-09-19 | Discharge: 2022-09-19 | Disposition: A | Discharge status: Home / Self Care | Payer: No Typology Code available for payment source

## 2022-09-19 ENCOUNTER — Encounter (HOSPITAL_BASED_OUTPATIENT_CLINIC_OR_DEPARTMENT_OTHER): Payer: Self-pay | Admitting: Pediatrics

## 2022-09-19 ENCOUNTER — Other Ambulatory Visit: Payer: Self-pay

## 2022-09-19 ENCOUNTER — Emergency Department (HOSPITAL_BASED_OUTPATIENT_CLINIC_OR_DEPARTMENT_OTHER)
Admission: EM | Admit: 2022-09-19 | Discharge: 2022-09-19 | Disposition: A | Discharge status: Home / Self Care | Payer: No Typology Code available for payment source | Attending: Emergency Medicine | Admitting: Emergency Medicine

## 2022-09-19 DIAGNOSIS — R1011 Right upper quadrant pain: Secondary | ICD-10-CM | POA: Insufficient documentation

## 2022-09-19 DIAGNOSIS — R1084 Generalized abdominal pain: Secondary | ICD-10-CM

## 2022-09-19 DIAGNOSIS — K219 Gastro-esophageal reflux disease without esophagitis: Secondary | ICD-10-CM | POA: Insufficient documentation

## 2022-09-19 DIAGNOSIS — N189 Chronic kidney disease, unspecified: Secondary | ICD-10-CM | POA: Insufficient documentation

## 2022-09-19 LAB — COMPREHENSIVE METABOLIC PANEL
ALT: 20 U/L (ref 0–44)
AST: 35 U/L (ref 15–41)
Albumin: 3.7 g/dL (ref 3.5–5.0)
Alkaline Phosphatase: 90 U/L (ref 38–126)
Anion gap: 8 (ref 5–15)
BUN: 8 mg/dL (ref 6–20)
CO2: 25 mmol/L (ref 22–32)
Calcium: 9 mg/dL (ref 8.9–10.3)
Chloride: 106 mmol/L (ref 98–111)
Creatinine, Ser: 0.69 mg/dL (ref 0.44–1.00)
GFR, Estimated: >60 mL/min (ref >60)
Glucose, Bld: 182 mg/dL — ABNORMAL HIGH (ref 70–99)
Potassium: 3.3 mmol/L — ABNORMAL LOW (ref 3.5–5.1)
Sodium: 139 mmol/L (ref 135–145)
Total Bilirubin: 0.9 mg/dL (ref 0.3–1.2)
Total Protein: 7.5 g/dL (ref 6.5–8.1)

## 2022-09-19 LAB — URINALYSIS, ROUTINE W REFLEX MICROSCOPIC
Bilirubin Urine: NEGATIVE
Glucose, UA: NEGATIVE mg/dL
Ketones, ur: NEGATIVE mg/dL
Leukocytes,Ua: NEGATIVE
Nitrite: NEGATIVE
Protein, ur: NEGATIVE mg/dL
Specific Gravity, Urine: 1.015 (ref 1.005–1.030)
pH: 6.5 (ref 5.0–8.0)

## 2022-09-19 LAB — PREGNANCY, URINE: Preg Test, Ur: NEGATIVE

## 2022-09-19 LAB — LIPASE, BLOOD: Lipase: 30 U/L (ref 11–51)

## 2022-09-19 LAB — URINALYSIS, MICROSCOPIC (REFLEX): WBC, UA: NONE SEEN WBC/hpf (ref 0–5)

## 2022-09-19 MED ORDER — IOHEXOL 300 MG/ML  SOLN
100.0000 mL | Freq: Once | INTRAMUSCULAR | Status: AC | PRN
  Administered 2022-09-19: 100 mL via INTRAVENOUS

## 2022-09-19 MED ORDER — OXYCODONE-ACETAMINOPHEN 5-325 MG PO TABS
1.0000 | ORAL_TABLET | Freq: Once | ORAL | Status: AC
  Administered 2022-09-19: 1 via ORAL
  Filled 2022-09-19: qty 1

## 2022-09-19 MED ORDER — DICYCLOMINE HCL 10 MG PO CAPS
20.0000 mg | ORAL_CAPSULE | Freq: Once | ORAL | Status: AC
  Administered 2022-09-19: 20 mg via ORAL
  Filled 2022-09-19: qty 2

## 2022-09-19 MED ORDER — SODIUM CHLORIDE 0.9 % IV BOLUS
1000.0000 mL | Freq: Once | INTRAVENOUS | Status: AC
  Administered 2022-09-19: 1000 mL via INTRAVENOUS

## 2022-09-19 MED ORDER — DICYCLOMINE HCL 20 MG PO TABS: 20.0000 mg | ORAL_TABLET | Freq: Two times a day (BID) | ORAL | 0 refills | Status: DC

## 2022-09-19 MED ORDER — FENTANYL CITRATE PF 50 MCG/ML IJ SOSY
50.0000 ug | PREFILLED_SYRINGE | INTRAMUSCULAR | Status: DC | PRN
  Administered 2022-09-19: 50 ug via INTRAVENOUS
  Filled 2022-09-19: qty 1

## 2022-09-19 NOTE — ED Provider Notes (Signed)
[facility] EMERGENCY DEPARTMENT Provider Note   CSN: [id number] Arrival date & time: 09/19/22  1703     History  Chief Complaint  Patient presents with   Abdominal Pain    Donna Sullivan is a 56 y.o. female. Past medical history pertinent for cirrhosis secondary to NASH, s/p appendectomy, cholecystectomy, and total hysterectomy with right oophorectomy, CKD, and GERD. Patient presents for right flank pain since Saturday. Pain began Saturday morning and has been progressively worsening. Has never experienced this pain before. Better when sitting upright and staying still. Some pain relief with taking the Toradol prescribed for her arthritis but has recently run out and been unable to get her prescription refilled. Unable to take ibuprofen due to history of GI bleed. Pain is worse with any movement, coughing, taking a deep breath. Denies N/V/D/C. No urinary symptoms, no change in bowel movements or dark stools. Long term smoker but has been unable to smoke due to difficulty taking a deep breath. No worsening SOB or cough compared to baseline. Had some chest pain last week but resolved. No current chest pain. Has not had liver enzymes measured at PCP for years due to insurance difficulties. Has not noticed any increased yellowing of eyes or skin. Last drank alcohol in August.    Abdominal Pain        Home Medications Prior to Admission medications   Medication Sig Start Date End Date Taking? Authorizing Provider  albuterol (PROVENTIL) (2.5 MG/3ML) 0.083% nebulizer solution Take 3 mLs (2.5 mg total) by nebulization every 6 (six) hours as needed for wheezing or shortness of breath. 01/17/17   Delos Haring, PA-C  beclomethasone (QVAR REDIHALER) 40 MCG/ACT inhaler Inhale into the lungs. 01/23/19   [provider]  gabapentin (NEURONTIN) 300 MG capsule Take by mouth. 01/23/19   [provider]  hydrOXYzine (ATARAX/VISTARIL) 25 MG tablet Take 0.5-1 tablets (12.5-25 mg  total) by mouth every 8 (eight) hours as needed for itching. 12/22/17   Doristine Devoid, PA-C  ibuprofen (ADVIL) 800 MG tablet Take 1 tablet (800 mg total) by mouth every 6 (six) hours as needed. 06/04/22   Felipa Furnace, DPM  ketorolac (TORADOL) 10 MG tablet Take 1 tablet (10 mg total) by mouth every 6 (six) hours as needed. 06/13/22   Felipa Furnace, DPM  ketorolac (TORADOL) 10 MG tablet Take 1 tablet (10 mg total) by mouth every 6 (six) hours as needed. 07/05/22   Felipa Furnace, DPM  ketorolac (TORADOL) 10 MG tablet Take 1 tablet (10 mg total) by mouth every 6 (six) hours as needed. 08/08/22   Felipa Furnace, DPM  Lido-Capsaicin-Men-Methyl Sal (1ST MEDX-PATCH/ LIDOCAINE EX) Apply topically.    [provider]  magic mouthwash SOLN Take 5 mLs by mouth 3 (three) times daily as needed for mouth pain. 04/29/19   Gareth Morgan, MD  methocarbamol (ROBAXIN) 500 MG tablet TAKE 1 TABLET BY MOUTH THREE TIMES A DAYAS NEEDED 04/24/19   [provider]  omeprazole (PRILOSEC) 40 MG capsule TAKE 1 CAPSULE BY MOUTH DAILY 09/05/18   Armbruster, Carlota Raspberry, MD  oxyCODONE (ROXICODONE) 5 MG immediate release tablet Take 1 tablet (5 mg total) by mouth every 4 (four) hours as needed for severe pain. 06/04/22   Felipa Furnace, DPM  oxyCODONE (ROXICODONE) 5 MG immediate release tablet Take 1 tablet (5 mg total) by mouth every 4 (four) hours as needed for severe pain. 06/22/22   Felipa Furnace, DPM  oxyCODONE-acetaminophen (PERCOCET) 5-325 MG  tablet Take 1 tablet by mouth every 4 (four) hours as needed for severe pain. 06/13/22   Felipa Furnace, DPM  QVAR 40 MCG/ACT inhaler Inhale 2 puffs into the lungs 2 (two) times daily. 01/17/17   Delos Haring, PA-C  valACYclovir (VALTREX) 1000 MG tablet Take 1 tablet (1,000 mg total) by mouth 3 (three) times daily. 11/26/17   Armbruster, Carlota Raspberry, MD      Allergies    Symbicort [budesonide-formoterol fumarate], Tramadol, Amoxicillin-pot clavulanate, Bactrim  [sulfamethoxazole-trimethoprim], Ciprofloxacin hcl, Clindamycin/lincomycin, Cymbalta [duloxetine hcl], Doxycycline, Keflex [cephalexin], and Wellbutrin [bupropion]    Review of Systems   Review of Systems  Gastrointestinal:  Positive for abdominal pain.    Physical Exam Updated Vital Signs BP (!) 156/75   Pulse 67   Temp 98.2 F (36.8 C) (Oral)   Resp 18   Ht '5\' 2"'$  (1.575 m)   Wt 77.1 kg   SpO2 100%   BMI 31.09 kg/m  Physical Exam Vitals and nursing note reviewed.  Constitutional:      General: She is not in acute distress.    Appearance: She is obese.  HENT:     Head: Normocephalic and atraumatic.     Nose: Nose normal.  Eyes:     General: No scleral icterus. Cardiovascular:     Rate and Rhythm: Normal rate and regular rhythm.     Pulses: Normal pulses.     Heart sounds: Normal heart sounds.  Pulmonary:     Effort: Pulmonary effort is normal. No respiratory distress.     Breath sounds: No wheezing.  Abdominal:     Palpations: Abdomen is soft.     Tenderness: There is abdominal tenderness.     Comments: Right upper quadrant, left lower quadrant, suprapubic and umbilical tenderness.  Seems to be quite uncomfortable palpation.  Patient is voluntarily guarding.  No involuntary guarding on exam.  Right CVA tenderness present  Musculoskeletal:     Cervical back: Normal range of motion.     Right lower leg: No edema.     Left lower leg: No edema.  Skin:    General: Skin is warm and dry.     Capillary Refill: Capillary refill takes less than 2 seconds.  Neurological:     Mental Status: She is alert. Mental status is at baseline.  Psychiatric:        Mood and Affect: Mood normal.        Behavior: Behavior normal.     ED Results / Procedures / Treatments   Labs (all labs ordered are listed, but only abnormal results are displayed) Labs Reviewed  COMPREHENSIVE METABOLIC PANEL - Abnormal; Notable for the following components:      Result Value   Potassium 3.3 (*)     Glucose, Bld 182 (*)    All other components within normal limits  CBC - Abnormal; Notable for the following components:   Platelets 103 (*)    All other components within normal limits  URINALYSIS, ROUTINE W REFLEX MICROSCOPIC - Abnormal; Notable for the following components:   APPearance CLOUDY (*)    Hgb urine dipstick TRACE (*)    All other components within normal limits  URINALYSIS, MICROSCOPIC (REFLEX) - Abnormal; Notable for the following components:   Bacteria, UA RARE (*)    All other components within normal limits  LIPASE, BLOOD  PREGNANCY, URINE    EKG None  Radiology No results found.  Procedures Procedures    Medications Ordered in ED Medications -  No data to display  ED Course/ Medical Decision Making/ A&P                           Medical Decision Making Amount and/or Complexity of Data Reviewed Labs: ordered. Radiology: ordered.  Risk Prescription drug management.   This patient presents to the ED for concern of abdominal pain, this involves a number of treatment options, and is a complaint that carries with it a moderate to high risk of complications and morbidity.  The differential diagnosis includes   The causes of generalized abdominal pain include but are not limited to AAA, mesenteric ischemia, appendicitis, diverticulitis, DKA, gastritis, gastroenteritis, AMI, nephrolithiasis, pancreatitis, peritonitis, adrenal insufficiency,lead poisoning, iron toxicity, intestinal ischemia, constipation, UTI,SBO/LBO, splenic rupture, biliary disease, IBD, IBS, PUD, or hepatitis. Ectopic pregnancy, ovarian torsion, PID.   Her symptoms have been ongoing for 5 days now.  Co morbidities: Discussed in HPI   Brief History:  Ulah Olmo is a 56 y.o. female. Past medical history pertinent for cirrhosis secondary to NASH, s/p appendectomy, cholecystectomy, and total hysterectomy with right oophorectomy, CKD, and GERD. Patient presents for right flank pain since  Saturday. Pain began Saturday morning and has been progressively worsening. Has never experienced this pain before. Better when sitting upright and staying still. Some pain relief with taking the Toradol prescribed for her arthritis but has recently run out and been unable to get her prescription refilled. Unable to take ibuprofen due to history of GI bleed. Pain is worse with any movement, coughing, taking a deep breath. Denies N/V/D/C. No urinary symptoms, no change in bowel movements or dark stools. Long term smoker but has been unable to smoke due to difficulty taking a deep breath. No worsening SOB or cough compared to baseline. Had some chest pain last week but resolved. No current chest pain. Has not had liver enzymes measured at PCP for years due to insurance difficulties. Has not noticed any increased yellowing of eyes or skin. Last drank alcohol in August.     EMR reviewed including pt PMHx, past surgical history and past visits to ER.   See HPI for more details   Lab Tests:   I ordered and independently interpreted labs. Labs notable for mild hypokalemia.  CBC unremarkable lipase within normal limits.  Urinalysis with rare bacteria negative for leukocytes or nitrates.  Low suspicion for UTI.  Pregnancy test negative.  Platelets somewhat decreased from prior.  Will need to continue to follow-up with [facility] as this likely represents continued worsening of her hepatic disease. Notably there is trace hemoglobin in urine.  Imaging Studies:  NAD. I personally reviewed all imaging studies and no acute abnormality found. I agree with radiology interpretation. No acute abnormal findings.  Cirrhosis, mild splenomegaly.  No acute changes however.  No evidence of intra-abdominal affection.   Cardiac Monitoring:  NA NA   Medicines ordered:  I ordered medication including fentanyl 50 mcg, Bentyl, Percocet, 1 L normal saline for abdominal pain Reevaluation of the patient after these  medicines showed that the patient improved I have reviewed the patients home medicines and have made adjustments as needed   Critical Interventions:     Consults/Attending Physician      Reevaluation:  After the interventions noted above I re-evaluated patient and found that they have :improved   Social Determinants of Health:      Problem List / ED Course:  Abdominal pain -improved with analgesia here.  CT  abdomen pelvis with contrast reassuring.  She does have mild hematuria no history of kidney stones possible recently passed kidney stone.  However given some diffuse abdominal pain that she seems to be having this could perhaps be hyperperistalsis or cramps will provide Bentyl.  Recommend close follow-up, strict return precautions given.  Patient tolerating p.o.   Dispostion:  After consideration of the diagnostic results and the patients response to treatment, I feel that the patent would benefit from discharge home with close follow-up with [facility] gastroenterology and primary care provider.  Very strict return precautions were provided.  Final Clinical Impression(s) / ED Diagnoses Final diagnoses:  Generalized abdominal pain  RUQ abdominal pain    Rx / DC Orders ED Discharge Orders     None         Tedd Sias, [place] 09/19/22 2210    Tretha Sciara, MD 09/19/22 2348

## 2022-09-19 NOTE — Discharge Instructions (Signed)
Please go to the emergency department as soon as you leave urgent care for further evaluation and management given severe abdominal pain.

## 2022-09-19 NOTE — ED Triage Notes (Signed)
C/O right sided abdominal pain started Saturday worst with movement, cough, and deep breath. Reported hx of chole and appendectomy. Stated have been taking toradol and was concern for internal bleeding; denies any dark tarry stool; and no NVD;

## 2022-09-19 NOTE — ED Notes (Signed)
Patient educated about not driving or performing other critical tasks (such as operating heavy machinery, caring for infant/toddler/child) due to sedative nature of narcotic medications received while in the ED.  Pt/caregiver verbalized understanding.   

## 2022-09-19 NOTE — ED Provider Notes (Signed)
[facility]    CSN: [id number] Arrival date & time: 09/19/22  1335      History   Chief Complaint Chief Complaint  Patient presents with   Flank Pain    HPI Donna Sullivan is a 56 y.o. female.   Patient presents with severe abdominal pain that started about 2 days ago.  Patient reports majority of the pain starts in the right upper quadrant and radiates down to the right lower quadrant.  Patient reports that she has had a cholecystectomy and appendectomy in the past.  Patient reports history of cirrhosis of the liver.  She states that she gets serial ultrasounds of the liver yearly but has not had any problems recently.  Denies any associated fever, nausea, vomiting, diarrhea.  Denies any blood in stool.  Patient having normal bowel movements.  She describes the pain as a constant "stabbing pain".  Patient reports the pain is continuous and is rated 9/10 on pain scale especially with movement.  Patient is concerned that she could have some sort of bleed given that she has been taking ketorolac by mouth that was prescribed by another healthcare provider.   Flank Pain    Past Medical History:  Diagnosis Date   Allergy    Anxiety    Asthma    Chronic kidney disease 1985   BLOCKED TUBE IN KIDNEY   Cirrhosis ([place])    secondary to NASH   Discoid lupus    Dr Ronnald Ramp, dermatologist   GERD (gastroesophageal reflux disease)    Headache(784.0)    MIGRAINES   Insomnia    Seizures ([place])    EPILEPSY- NO SEIZURES SINCE AGE 37   Sleep apnea    Tendonitis     Patient Active Problem List   Diagnosis Date Noted   Cirrhosis of liver without ascites ([place])    Benign neoplasm of sigmoid colon    Lower GI bleed 05/21/2017   Insomnia 09/06/2016   COPD with chronic bronchitis ([place]) 09/06/2016   Tension headache 03/02/2016   Right lumbar radiculopathy 03/02/2016   Acute bacterial sinusitis 12/04/2014   Pelvic mass in female 03/18/2014   Hematuria, microscopic 03/05/2014   Left  flank pain 06/08/2013   Routine general medical examination at a health care facility 04/10/2013   Chest pain, atypical 04/10/2013   LLQ pain 04/10/2013   Trapezius muscle spasm 03/29/2013   Hair loss 11/14/2012   Depression 11/14/2012   Pelvic pain in female 09/23/2012   Fecal incontinence 05/13/2012   Seasonal allergic rhinitis 05/13/2012   Panic anxiety syndrome 05/13/2012   Stress incontinence, female 11/06/2011   ROM 05/22/2010   ANKLE PAIN, LEFT 03/09/2009   LUPUS ERYTHEMATOSUS, DISCOID 01/26/2009    Past Surgical History:  Procedure Laterality Date   ABDOMINAL HYSTERECTOMY     ANKLE RECONSTRUCTION  04/2009   APPENDECTOMY     CESAREAN SECTION     CHOLECYSTECTOMY     COLONOSCOPY WITH PROPOFOL N/A 05/22/2017   Procedure: COLONOSCOPY WITH PROPOFOL;  Surgeon: Manus Gunning, MD;  Location: [facility];  Service: Gastroenterology;  Laterality: N/A;   DIAGNOSTIC LAPAROSCOPY     KIDNEY SURGERY     right, straightened tube   LAPAROSCOPY  09/23/2012   Procedure: LAPAROSCOPY OPERATIVE;  Surgeon: Cheri Fowler, MD;  Location: [facility];  Service: Gynecology;  Laterality: N/A;   RIGHT OOPHORECTOMY      OB History     Gravida  3   Para  1   Term  Preterm      AB  2   Living  1      SAB      IAB      Ectopic      Multiple      Live Births               Home Medications    Prior to Admission medications   Medication Sig Start Date End Date Taking? Authorizing Provider  albuterol (PROVENTIL) (2.5 MG/3ML) 0.083% nebulizer solution Take 3 mLs (2.5 mg total) by nebulization every 6 (six) hours as needed for wheezing or shortness of breath. 01/17/17   Delos Haring, PA-C  beclomethasone (QVAR REDIHALER) 40 MCG/ACT inhaler Inhale into the lungs. 01/23/19   [provider]  gabapentin (NEURONTIN) 300 MG capsule Take by mouth. 01/23/19   [provider]  hydrOXYzine (ATARAX/VISTARIL) 25 MG tablet Take 0.5-1 tablets (12.5-25 mg total)  by mouth every 8 (eight) hours as needed for itching. 12/22/17   Doristine Devoid, PA-C  ibuprofen (ADVIL) 800 MG tablet Take 1 tablet (800 mg total) by mouth every 6 (six) hours as needed. 06/04/22   Felipa Furnace, DPM  ketorolac (TORADOL) 10 MG tablet Take 1 tablet (10 mg total) by mouth every 6 (six) hours as needed. 06/13/22   Felipa Furnace, DPM  ketorolac (TORADOL) 10 MG tablet Take 1 tablet (10 mg total) by mouth every 6 (six) hours as needed. 07/05/22   Felipa Furnace, DPM  ketorolac (TORADOL) 10 MG tablet Take 1 tablet (10 mg total) by mouth every 6 (six) hours as needed. 08/08/22   Felipa Furnace, DPM  Lido-Capsaicin-Men-Methyl Sal (1ST MEDX-PATCH/ LIDOCAINE EX) Apply topically.    [provider]  magic mouthwash SOLN Take 5 mLs by mouth 3 (three) times daily as needed for mouth pain. 04/29/19   Gareth Morgan, MD  methocarbamol (ROBAXIN) 500 MG tablet TAKE 1 TABLET BY MOUTH THREE TIMES A DAYAS NEEDED 04/24/19   [provider]  omeprazole (PRILOSEC) 40 MG capsule TAKE 1 CAPSULE BY MOUTH DAILY 09/05/18   Armbruster, Carlota Raspberry, MD  oxyCODONE (ROXICODONE) 5 MG immediate release tablet Take 1 tablet (5 mg total) by mouth every 4 (four) hours as needed for severe pain. 06/04/22   Felipa Furnace, DPM  oxyCODONE (ROXICODONE) 5 MG immediate release tablet Take 1 tablet (5 mg total) by mouth every 4 (four) hours as needed for severe pain. 06/22/22   Felipa Furnace, DPM  oxyCODONE-acetaminophen (PERCOCET) 5-325 MG tablet Take 1 tablet by mouth every 4 (four) hours as needed for severe pain. 06/13/22   Felipa Furnace, DPM  QVAR 40 MCG/ACT inhaler Inhale 2 puffs into the lungs 2 (two) times daily. 01/17/17   Delos Haring, PA-C  valACYclovir (VALTREX) 1000 MG tablet Take 1 tablet (1,000 mg total) by mouth 3 (three) times daily. 11/26/17   Armbruster, Carlota Raspberry, MD    Family History Family History  Problem Relation Age of Onset   Coronary artery disease Mother    Hypertension Mother     Hypertension Father    Colon cancer Maternal Grandfather     Social History Social History   Tobacco Use   Smoking status: Every Day    Packs/day: 0.50    Types: Cigarettes   Smokeless tobacco: Never   Tobacco comments:    Maybe later  Vaping Use   Vaping Use: Never used  Substance Use Topics   Alcohol use: Yes  Comment: occ   Drug use: Yes    Types: Marijuana    Comment: cbd     Allergies   Symbicort [budesonide-formoterol fumarate], Tramadol, Amoxicillin-pot clavulanate, Bactrim [sulfamethoxazole-trimethoprim], Ciprofloxacin hcl, Clindamycin/lincomycin, Cymbalta [duloxetine hcl], Doxycycline, Keflex [cephalexin], and Wellbutrin [bupropion]   Review of Systems Review of Systems Per HPI  Physical Exam Triage Vital Signs ED Triage Vitals [09/19/22 1531]  Enc Vitals Group     BP 131/77     Pulse Rate 60     Resp 16     Temp 98 F (36.7 C)     Temp Source Oral     SpO2 98 %     Weight      Height      Head Circumference      Peak Flow      Pain Score 9     Pain Loc      Pain Edu?      Excl. in [place]?    No data found.  Updated Vital Signs BP 131/77 (BP Location: Left Arm)   Pulse 60   Temp 98 F (36.7 C) (Oral)   Resp 16   SpO2 98%   Visual Acuity Right Eye Distance:   Left Eye Distance:   Bilateral Distance:    Right Eye Near:   Left Eye Near:    Bilateral Near:     Physical Exam Constitutional:      General: She is not in acute distress.    Appearance: Normal appearance. She is not toxic-appearing or diaphoretic.  HENT:     Head: Normocephalic and atraumatic.  Eyes:     Extraocular Movements: Extraocular movements intact.     Conjunctiva/sclera: Conjunctivae normal.  Cardiovascular:     Rate and Rhythm: Normal rate and regular rhythm.     Pulses: Normal pulses.     Heart sounds: Normal heart sounds.  Pulmonary:     Effort: Pulmonary effort is normal. No respiratory distress.     Breath sounds: Normal breath sounds.  Abdominal:      General: Bowel sounds are normal. There is no distension.     Palpations: Abdomen is soft.     Tenderness: There is abdominal tenderness in the right upper quadrant and right lower quadrant. There is guarding.     Comments: Patient is exquisitely tender to right side of abdomen with any type of palpation.  Neurological:     General: No focal deficit present.     Mental Status: She is alert and oriented to person, place, and time. Mental status is at baseline.  Psychiatric:        Mood and Affect: Mood normal.        Behavior: Behavior normal.        Thought Content: Thought content normal.        Judgment: Judgment normal.      UC Treatments / Results  Labs (all labs ordered are listed, but only abnormal results are displayed) Labs Reviewed - No data to display  EKG   Radiology No results found.  Procedures Procedures (including critical care time)  Medications Ordered in UC Medications - No data to display  Initial Impression / Assessment and Plan / UC Course  I have reviewed the triage vital signs and the nursing notes.  Pertinent labs & imaging results that were available during my care of the patient were reviewed by me and considered in my medical decision making (see chart for details).     Given severity  of abdominal pain and how significantly tender patient is with palpation on exam, I do think this warrants imaging of the abdomen given there is concern for acute abdomen.  Do not have advanced imaging here in urgent care so patient was advised to go to the emergency department for further evaluation and management.  Patient was agreeable with plan.  Vital signs able at discharge.  Agree with patient self transport to the hospital. Final Clinical Impressions(s) / UC Diagnoses   Final diagnoses:  Abdominal pain, right upper quadrant     Discharge Instructions      Please go to the emergency department as soon as you leave urgent care for further evaluation and  management given severe abdominal pain.   ED Prescriptions   None    PDMP not reviewed this encounter.   Teodora Medici, [place] 09/19/22 [phone]

## 2022-09-19 NOTE — Discharge Instructions (Signed)
Please take Bentyl as prescribed, follow-up with your gastroenterologist and primary care provider.  Please return the emergency room for any new or concerning symptoms.  Make sure you are drinking plenty of water and taking your home medications as prescribed.

## 2022-09-19 NOTE — ED Notes (Signed)
Patient is being discharged from the Urgent Care and sent to the Emergency Department via self . Per Hildred Alamin, patient is in need of higher level of care due to abd pain. Patient is aware and verbalizes understanding of plan of care.  Vitals:   09/19/22 1531  BP: 131/77  Pulse: 60  Resp: 16  Temp: 98 F (36.7 C)  SpO2: 98%

## 2022-09-19 NOTE — ED Triage Notes (Signed)
Pt c/o right flank pain onset ~ Friday.

## 2022-09-20 ENCOUNTER — Telehealth: Payer: Self-pay | Admitting: Gastroenterology

## 2022-09-20 LAB — CBC
HCT: 43.1 % (ref 36.0–46.0)
Hemoglobin: 14.9 g/dL (ref 12.0–15.0)
MCH: 31.3 pg (ref 26.0–34.0)
MCHC: 34.6 g/dL (ref 30.0–36.0)
MCV: 90.5 fL (ref 80.0–100.0)
Platelets: 103 10*3/uL — ABNORMAL LOW (ref 150–400)
RBC: 4.76 MIL/uL (ref 3.87–5.11)
RDW: 12.1 % (ref 11.5–15.5)
WBC: 6.7 10*3/uL (ref 4.0–10.5)
nRBC: 0 % (ref 0.0–0.2)

## 2022-09-20 NOTE — Telephone Encounter (Signed)
Patient called states she was at [facility] yesterday and they asked her to FU with [place]. States she needs a sooner appointment than 10/23. Wondering there is anything else she could do to help the pain. Requesting a call back as soon as possible. Please call to advise.

## 2022-09-21 ENCOUNTER — Encounter: Payer: No Typology Code available for payment source | Admitting: Podiatry

## 2022-09-21 NOTE — Telephone Encounter (Signed)
Returned call to patient. I left patient a detailed vm informing her that unfortunately we do not have any sooner appts. I advised pt that the ED prescribed her Dicyclomine which is what we recommend for abdominal pain or cramping. Pt will need to keep appt for further recommendations since she has not been seen since 2018. I told pt that she can call back periodically to see if there have been any cancellations, but right now she has the soonest appt.

## 2022-09-28 ENCOUNTER — Ambulatory Visit (INDEPENDENT_AMBULATORY_CARE_PROVIDER_SITE_OTHER): Payer: No Typology Code available for payment source | Admitting: Podiatry

## 2022-09-28 DIAGNOSIS — M216X1 Other acquired deformities of right foot: Secondary | ICD-10-CM | POA: Diagnosis not present

## 2022-09-28 NOTE — Progress Notes (Signed)
Subjective:  Patient ID: Donna Sullivan, female    DOB: 1966/06/24,  MRN: [id number]  Chief Complaint  Patient presents with   Routine Post Op    DOS: 06/04/2022 Procedure: Right fifth metatarsal osteotomy floating  56 y.o. female returns for post-op check.  Patient states she is doing well.  She states she is doing okay.  No further pain.  She is doing okay return to regular shoes no pain.  Review of Systems: Negative except as noted in the HPI. Denies N/V/F/Ch.  Past Medical History:  Diagnosis Date   Allergy    Anxiety    Asthma    Chronic kidney disease 1985   BLOCKED TUBE IN KIDNEY   Cirrhosis ([place])    secondary to NASH   Discoid lupus    Dr Ronnald Ramp, dermatologist   GERD (gastroesophageal reflux disease)    Headache(784.0)    MIGRAINES   Insomnia    Seizures ([place])    EPILEPSY- NO SEIZURES SINCE AGE 34   Sleep apnea    Tendonitis     Current Outpatient Medications:    albuterol (PROVENTIL) (2.5 MG/3ML) 0.083% nebulizer solution, Take 3 mLs (2.5 mg total) by nebulization every 6 (six) hours as needed for wheezing or shortness of breath., Disp: 75 mL, Rfl: 12   beclomethasone (QVAR REDIHALER) 40 MCG/ACT inhaler, Inhale into the lungs., Disp: , Rfl:    dicyclomine (BENTYL) 20 MG tablet, Take 1 tablet (20 mg total) by mouth 2 (two) times daily., Disp: 20 tablet, Rfl: 0   gabapentin (NEURONTIN) 300 MG capsule, Take by mouth., Disp: , Rfl:    hydrOXYzine (ATARAX/VISTARIL) 25 MG tablet, Take 0.5-1 tablets (12.5-25 mg total) by mouth every 8 (eight) hours as needed for itching., Disp: 20 tablet, Rfl: 0   ibuprofen (ADVIL) 800 MG tablet, Take 1 tablet (800 mg total) by mouth every 6 (six) hours as needed., Disp: 60 tablet, Rfl: 1   ketorolac (TORADOL) 10 MG tablet, Take 1 tablet (10 mg total) by mouth every 6 (six) hours as needed., Disp: 20 tablet, Rfl: 0   ketorolac (TORADOL) 10 MG tablet, Take 1 tablet (10 mg total) by mouth every 6 (six) hours as needed., Disp: 20 tablet, Rfl:  0   ketorolac (TORADOL) 10 MG tablet, Take 1 tablet (10 mg total) by mouth every 6 (six) hours as needed., Disp: 20 tablet, Rfl: 0   Lido-Capsaicin-Men-Methyl Sal (1ST MEDX-PATCH/ LIDOCAINE EX), Apply topically., Disp: , Rfl:    magic mouthwash SOLN, Take 5 mLs by mouth 3 (three) times daily as needed for mouth pain., Disp: 5 mL, Rfl: 0   methocarbamol (ROBAXIN) 500 MG tablet, TAKE 1 TABLET BY MOUTH THREE TIMES A DAYAS NEEDED, Disp: , Rfl:    omeprazole (PRILOSEC) 40 MG capsule, TAKE 1 CAPSULE BY MOUTH DAILY, Disp: 90 capsule, Rfl: 1   oxyCODONE (ROXICODONE) 5 MG immediate release tablet, Take 1 tablet (5 mg total) by mouth every 4 (four) hours as needed for severe pain., Disp: 30 tablet, Rfl: 0   oxyCODONE (ROXICODONE) 5 MG immediate release tablet, Take 1 tablet (5 mg total) by mouth every 4 (four) hours as needed for severe pain., Disp: 30 tablet, Rfl: 0   oxyCODONE-acetaminophen (PERCOCET) 5-325 MG tablet, Take 1 tablet by mouth every 4 (four) hours as needed for severe pain., Disp: 30 tablet, Rfl: 0   QVAR 40 MCG/ACT inhaler, Inhale 2 puffs into the lungs 2 (two) times daily., Disp: 1 Inhaler, Rfl: 6   valACYclovir (VALTREX) 1000 MG tablet,  Take 1 tablet (1,000 mg total) by mouth 3 (three) times daily., Disp: 21 tablet, Rfl: 0  Social History   Tobacco Use  Smoking Status Every Day   Packs/day: 0.50   Types: Cigarettes  Smokeless Tobacco Never  Tobacco Comments   Maybe later    Allergies  Allergen Reactions   Symbicort [Budesonide-Formoterol Fumarate] Other (See Comments)    Caused open sores in mouth    Tramadol Shortness Of Breath   Amoxicillin-Pot Clavulanate     REACTION: dizziness, fatigue/drowsiness and nausea   Bactrim [Sulfamethoxazole-Trimethoprim]     Blister in mouth   Ciprofloxacin Hcl     Blisters in mouth   Clindamycin/Lincomycin Other (See Comments)    Sores in mouth    Cymbalta [Duloxetine Hcl] Other (See Comments)    Per pt makes her "jacked up"    Doxycycline     Fatigue, sleepy, worse    Keflex [Cephalexin]     Blister in mouth   Wellbutrin [Bupropion]     Blisters in mouth   Objective:  There were no vitals filed for this visit. There is no height or weight on file to calculate BMI. Constitutional Well developed. Well nourished.  Vascular Foot warm and well perfused. Capillary refill normal to all digits.   Neurologic Normal speech. Oriented to person, place, and time. Epicritic sensation to light touch grossly present bilaterally.  Dermatologic Skin well reepithelialized.  No clinical signs of dehiscence or complication noted good correction alignment noted.  Orthopedic: No tenderness to palpation noted about the surgical site.   Radiographs: 3 views of skeletally mature adult right foot: Osteotomy noted to the fifth metatarsal appears to be in good position alignment. Assessment:   No diagnosis found.   Plan:  Patient was evaluated and treated and all questions answered.  S/p foot surgery right -Clinically healed and officially discharged from my care if any foot and ankle issues on future advised her to come back and see me.  She states understanding.  I discussed shoe gear modification.  No follow-ups on file.

## 2022-10-15 ENCOUNTER — Telehealth: Payer: Self-pay | Admitting: Gastroenterology

## 2022-10-15 MED ORDER — DICYCLOMINE HCL 20 MG PO TABS: 20.0000 mg | ORAL_TABLET | Freq: Two times a day (BID) | ORAL | 0 refills | Status: DC

## 2022-10-15 NOTE — Telephone Encounter (Signed)
Lm on vm for patient to return call. Bentyl refill sent to [place].

## 2022-10-15 NOTE — Telephone Encounter (Signed)
Patient called states she was just seen at ED and was given Bentyl and she is having chronic abdominal pain seeking advise.

## 2022-10-15 NOTE — Telephone Encounter (Signed)
Looked at her imaging, do not see anything there to be causing her pain. Stable changes of cirrhosis, she has not been seen in 4 years. Agree a clinic visit is needed to examine her and help clarify cause of pain, not sure if this is musculoskeletal in etiology. Okay to refill bentyl, she should take her omeprazole as well. She can call back to see if any openings occur or place on a clinic wait list to call her should openings arise. thanks

## 2022-10-15 NOTE — Telephone Encounter (Signed)
Returned call to patient. She reports that she was seen in the ED on 09/19/22 and prescribed Bentyl. Pt states that she took the Bentyl as prescribed for a few days and then took it PRN once she started feeling better. Pt reports continued RUQ pain. She is s/p cholecystectomy and appendectomy. CT in the ED showed Cirrhosis. Pt is wanting to know what to do for the pain. I told pt that we do not know what is causing the pain, she can try Tylenol Extra strength PRN and heating pad. Pt asked for a sooner appt, no appts available this week. Pt was advised to call back to see if there have been any cancellations. Pt states that she is out of Bentyl wondering if we can refill until her appt on Monday. Pt states that the medicine helps her get some rest, doesn't do much for the pain. Pt is wondering if additional imaging may be ordered, I told pt that Estill Bamberg can discuss recommendations after his visit on Monday. She is aware that refill may not be guaranteed since she has not been seen in several years. ED records available in [place]. Please advise, thanks.

## 2022-10-16 NOTE — Telephone Encounter (Signed)
Lm on vm for patient to return call 

## 2022-10-17 NOTE — Telephone Encounter (Signed)
Pt returned call. We reviewed Dr. Doyne Keel recommendations. Pt was able to pick up RX for Bentyl, she plans to keep f/u appt as scheduled for next week. Pt verbalized understanding and had no concerns at the end of the call.

## 2022-10-19 NOTE — Progress Notes (Addendum)
10/22/2022 Donna Sullivan [id number] 03-19-1966  Referring provider: Janie Morning, DO Primary GI doctor: Dr. Havery Moros  ASSESSMENT AND PLAN:   ADDENDUM: - Long discussion with the patient, states her chest pain is worse, breathing is the same which she relates to her smoking history.  Patient had foot surgery July/August, has had worsening right upper quadrant flank/abdominal pain radiating to her back, worse with deep breathing and worsening DOE.   Ct AB and pelvis unremarkable Chest x-ray was unremarkable, no leg swelling no history of blood clots. D-dimer showed elevation at 2.14, so discussed with the patient  my medical advice would be going to the ER or getting CTA stat, she was scheduled for Friday however states she needs to reschedule this due to money and time issues.  My nurses also discussed this with the patient multiple times.  I have stressed that if it is a pulmonary embolism with history and symptoms this is something that could kill her. Patient expressed understanding.  Stressed ER precautions of any worsening shortness of breath, chest pain, palpitations, dizziness.  Expressed understanding and will go to the ER if she has any of the symptoms. Patient was lost to follow-up for 2 years for her cirrhosis, states she had no knowledge of her cirrhosis, likely poor medical literacy and non compliance.  Patient does have some improvement of the pain with lidocaine patches, possible musculoskeletal however with history I do think it is pertinent to get CTA or evaluation with with primary care/ER.  Patient states she will follow-up with her primary care.  We will send this note to them. We will recheck D-dimer.  Cirrhosis of liver secondary to NASH with thrombocytopenia Will get AFP, INR, CBC, CMET to calculate MELD -HCC screening due 03/2023 ( had CT AB and pelvis in ER 08/2022) -EGD screening 11/2017 no varices, + short barrett's and gastric adenoma -Hepatic  encephalopathy-Patient does not have history of encephalopathy, no evidence here of encephalopathy. - Ascites- does not have history of ascites.  No evidence of ascites on exam.  -Nutrition and low sodium diet discussed with patient and information given Follow up 6 months Will schedule for EGD in [place] to evaluate for varices, if there are varices will need to be done at the hospital from then on Had some elevation of iron/ferritin in 2018 with hepatocellular work up, she is very tan, mom with hemachromatosis, will recheck iron/ferritin, she is not on iron  RUQ AB pain worse with deep breath with DOE x 2 weeks CT AB and pelvis 08/2022 without acute pathology Has DOE, cough, surgery on her foot in August No evidence of leg swelling, blood clots but will get Ddimer with symptoms and recent surgery, if elevated will need CTA Get CXR with cough, DOE, smoking and RUQ pain rule out pneumonia/mass.  Will get BNP with history of cirrhosis and DOE/cough, some data to suggest checking this at baseline as well in patient's with cirrhosis No chest pain, ER precautions discussed, low possibility of ACS.   Has also been on toradol from recent food surgery, possible from GERD?, increase to twice a day of her PPI Could be musculoskeletal, no rash for zoster, given information about costochondritis, can do voltern gel/lidocaine patches, follow up PCP  Gastric adenoma and short segment Barrett's EGD 11/2017 no varices, + short barrett's and gastric adenoma Recall 1 year, lost to follow up Will schedule EGD to evaluate for possible H. pylori, esophagitis, gastritis, peptic ulcer disease, etc.. I discussed risks of EGD  with patient today, including risk of sedation, bleeding or perforation.  Patient provides understanding and gave verbal consent to proceed.  History of adenomatous polyp of colon 05/2017 colon with adenomas and SS polyps, tics, recall 3 years We have discussed the risks of bleeding, infection,  perforation, medication reactions, and remote risk of death associated with colonoscopy. All questions were answered and the patient acknowledges these risk and wishes to proceed.   History of Present Illness:  56 y.o. female with a past medical history of COPD, discoid lupus, CKD, depression, fecal incontinence, personal history of polyps, cirrhosis of liver without ascites secondary to NASH without history of decompensation and others listed below, presents for ER follow up on 09/19/22 for abdominal pain.   Patient status post appendectomy, cholecystectomy, total hysterectomy with right oophorectomy 06/28/2017 colonoscopy normal ileum, diverticulosis, 5 polyps hyperplastic for some from right-sided colon adenomas SS polyps, recall 3 years 05/2020 07/08/2017 liver biopsy done with cirrhosis secondary to NASH 12/27/2017 EGD for cirrhosis surveillance showed 3 cm hiatal hernia, grade a esophagitis, single gastric polyp normal duodenum pathology showed short segment of Barrett's esophagus and gastric adenoma without dysplasia suggested started on omeprazole once daily.  Recall 1 year Last seen in the office 2019, lost to follow up.   09/19/2022 went to the ER with generalized abdominal pain Platelets 103, no anemia no leukocytosis, potassium 3.3. CT abdomen pelvis with contrast at the ER visit for abdominal pain showed sigmoid diverticulosis, cirrhosis with mild splenomegaly, aortic atherosclerosis no other abnormalities.  She states for 2-3 weeks has been having RUQ pain that radiates through to her back, hurts to breath worse with deep breath, coughing, moving, standing sitting.  No rash. She has SOB, coughing, wheezing.  She has no fever, but can have hot one minute and cold one minute.  No palpitations, had chest pain several weeks ago, non exertional, left upper chest pain cramping, lasted for several days, went away.  She has nausea, no vomiting.  No melena, no hematochezia.  No swelling in legs  or AB, feels some swelling in RUQ.  She continues to smoke.  Had right foot surgery July 5th, released Sept. Decreased activity during that time, no history of blood clots for the patient. Was on toradol with the foot surgery.   Last iron/ferritin elevated, patient is very tan, she does not take iron, mother recent diagnosis of hemachromatosis.   There is evidence of portal hypertension on this last CT AB with mild splenomegaly and thrombocytopenia. Patient does not have history of encephalopathy. She denies swelling.  She is not on spironolactone and lasix.  Wt Readings from Last 3 Encounters:  10/22/22 178 lb 2 oz (80.8 kg)  09/19/22 170 lb (77.1 kg)  05/04/22 170 lb (77.1 kg)    She  reports that she has been smoking cigarettes. She has been smoking an average of .5 packs per day. She has never used smokeless tobacco. She reports current alcohol use. She reports current drug use. Drug: Marijuana. Her family history includes Colon cancer in her maternal grandfather; Coronary artery disease in her mother; Hypertension in her father and mother.   Current Medications:     Current Outpatient Medications (Respiratory):    albuterol (PROVENTIL) (2.5 MG/3ML) 0.083% nebulizer solution, Take 3 mLs (2.5 mg total) by nebulization every 6 (six) hours as needed for wheezing or shortness of breath.   beclomethasone (QVAR REDIHALER) 40 MCG/ACT inhaler, Inhale into the lungs.   magic mouthwash SOLN*, Take 5 mLs by mouth 3 (  three) times daily as needed for mouth pain.   QVAR 40 MCG/ACT inhaler, Inhale 2 puffs into the lungs 2 (two) times daily.  Current Outpatient Medications (Analgesics):    ibuprofen (ADVIL) 800 MG tablet, Take 1 tablet (800 mg total) by mouth every 6 (six) hours as needed.   ketorolac (TORADOL) 10 MG tablet, Take 1 tablet (10 mg total) by mouth every 6 (six) hours as needed.   ketorolac (TORADOL) 10 MG tablet, Take 1 tablet (10 mg total) by mouth every 6 (six) hours as needed.    ketorolac (TORADOL) 10 MG tablet, Take 1 tablet (10 mg total) by mouth every 6 (six) hours as needed.   oxyCODONE (ROXICODONE) 5 MG immediate release tablet, Take 1 tablet (5 mg total) by mouth every 4 (four) hours as needed for severe pain. (Patient not taking: Reported on 10/22/2022)   oxyCODONE (ROXICODONE) 5 MG immediate release tablet, Take 1 tablet (5 mg total) by mouth every 4 (four) hours as needed for severe pain. (Patient not taking: Reported on 10/22/2022)   Current Outpatient Medications (Other):    dicyclomine (BENTYL) 20 MG tablet, Take 1 tablet (20 mg total) by mouth 2 (two) times daily.   gabapentin (NEURONTIN) 300 MG capsule, Take by mouth.   hydrOXYzine (ATARAX/VISTARIL) 25 MG tablet, Take 0.5-1 tablets (12.5-25 mg total) by mouth every 8 (eight) hours as needed for itching.   Lido-Capsaicin-Men-Methyl Sal (1ST MEDX-PATCH/ LIDOCAINE EX), Apply topically.   magic mouthwash SOLN*, Take 5 mLs by mouth 3 (three) times daily as needed for mouth pain.   methocarbamol (ROBAXIN) 500 MG tablet, TAKE 1 TABLET BY MOUTH THREE TIMES A DAYAS NEEDED   omeprazole (PRILOSEC) 40 MG capsule, TAKE 1 CAPSULE BY MOUTH DAILY   valACYclovir (VALTREX) 1000 MG tablet, Take 1 tablet (1,000 mg total) by mouth 3 (three) times daily. * These medications belong to multiple therapeutic classes and are listed under each applicable group.  Surgical History:  She  has a past surgical history that includes Appendectomy; Kidney surgery; Right oophorectomy; Ankle reconstruction (04/2009); Abdominal hysterectomy; Cesarean section; Cholecystectomy; Diagnostic laparoscopy; laparoscopy (09/23/2012); and Colonoscopy with propofol (N/A, 05/22/2017).  Current Medications, Allergies, Past Medical History, Past Surgical History, Family History and Social History were reviewed in [employer] record.  Physical Exam: BP 136/72   Pulse 70   Ht 5' 1.75" (1.568 m)   Wt 178 lb 2 oz (80.8 kg)   BMI 32.84  kg/m  General :  Alert, well developed female in no acute distress Head:  Normocephalic and atraumatic. Eyes :  sclerae anicteric,conjunctive pink  Heart:  regular rate and rhythm Pulm:  Decreased breath sounds diffusely, no active wheezing/rhonchi Abdomen:   Soft, Obese AB, skin exam normal, Normal bowel sounds. mild tenderness in the RUQ. Without guarding and Without rebound, hepatomegaly noted. no  fluid wave, no  shifting dullness.  Extremities:   Without edema, no erythema, swelling or warmth.  Msk:  Symmetrical without gross deformities. Peripheral pulses intact.  Neurologic: Alert and  oriented x4;  grossly normal neurologically. without asterixis or clonus.  Skin:   without jaundice but patient is very tan. no palmar erythema or spider angioma.   Psychiatric:  Demonstrates good judgement and reason without abnormal affect or behaviors.    Vladimir Crofts, PA-C 10/22/22

## 2022-10-22 ENCOUNTER — Ambulatory Visit (INDEPENDENT_AMBULATORY_CARE_PROVIDER_SITE_OTHER)
Admission: RE | Admit: 2022-10-22 | Discharge: 2022-10-22 | Disposition: A | Discharge status: Home / Self Care | Payer: 59 | Source: Ambulatory Visit | Attending: Physician Assistant | Admitting: Physician Assistant

## 2022-10-22 ENCOUNTER — Encounter: Payer: Self-pay | Admitting: Physician Assistant

## 2022-10-22 ENCOUNTER — Ambulatory Visit (INDEPENDENT_AMBULATORY_CARE_PROVIDER_SITE_OTHER): Payer: 59 | Admitting: Physician Assistant

## 2022-10-22 ENCOUNTER — Other Ambulatory Visit (INDEPENDENT_AMBULATORY_CARE_PROVIDER_SITE_OTHER): Payer: 59

## 2022-10-22 VITALS — BP 136/72 | HR 70 | Ht 61.75 in | Wt 178.1 lb

## 2022-10-22 DIAGNOSIS — G8929 Other chronic pain: Secondary | ICD-10-CM

## 2022-10-22 DIAGNOSIS — R1084 Generalized abdominal pain: Secondary | ICD-10-CM

## 2022-10-22 DIAGNOSIS — D696 Thrombocytopenia, unspecified: Secondary | ICD-10-CM | POA: Diagnosis not present

## 2022-10-22 DIAGNOSIS — K7460 Unspecified cirrhosis of liver: Secondary | ICD-10-CM

## 2022-10-22 DIAGNOSIS — R0609 Other forms of dyspnea: Secondary | ICD-10-CM

## 2022-10-22 DIAGNOSIS — D131 Benign neoplasm of stomach: Secondary | ICD-10-CM | POA: Diagnosis not present

## 2022-10-22 DIAGNOSIS — Z86010 Personal history of colonic polyps: Secondary | ICD-10-CM

## 2022-10-22 DIAGNOSIS — R1011 Right upper quadrant pain: Secondary | ICD-10-CM

## 2022-10-22 DIAGNOSIS — K2270 Barrett's esophagus without dysplasia: Secondary | ICD-10-CM

## 2022-10-22 LAB — CBC WITH DIFFERENTIAL/PLATELET
Basophils Absolute: 0.1 10*3/uL (ref 0.0–0.1)
Basophils Relative: 1.1 % (ref 0.0–3.0)
Eosinophils Absolute: 0.5 10*3/uL (ref 0.0–0.7)
Eosinophils Relative: 6.4 % — ABNORMAL HIGH (ref 0.0–5.0)
HCT: 43.6 % (ref 36.0–46.0)
Hemoglobin: 14.6 g/dL (ref 12.0–15.0)
Lymphocytes Relative: 25.8 % (ref 12.0–46.0)
Lymphs Abs: 2 10*3/uL (ref 0.7–4.0)
MCHC: 33.4 g/dL (ref 30.0–36.0)
MCV: 89.7 fl (ref 78.0–100.0)
Monocytes Absolute: 0.5 10*3/uL (ref 0.1–1.0)
Monocytes Relative: 6.3 % (ref 3.0–12.0)
Neutro Abs: 4.8 10*3/uL (ref 1.4–7.7)
Neutrophils Relative %: 60.4 % (ref 43.0–77.0)
Platelets: 129 10*3/uL — ABNORMAL LOW (ref 150.0–400.0)
RBC: 4.86 Mil/uL (ref 3.87–5.11)
RDW: 12.6 % (ref 11.5–15.5)
WBC: 7.9 10*3/uL (ref 4.0–10.5)

## 2022-10-22 LAB — IBC + FERRITIN
Ferritin: 295.2 ng/mL — ABNORMAL HIGH (ref 10.0–291.0)
Iron: 81 ug/dL (ref 42–145)
Saturation Ratios: 33.8 % (ref 20.0–50.0)
TIBC: 239.4 ug/dL — ABNORMAL LOW (ref 250.0–450.0)
Transferrin: 171 mg/dL — ABNORMAL LOW (ref 212.0–360.0)

## 2022-10-22 LAB — COMPREHENSIVE METABOLIC PANEL
ALT: 15 U/L (ref 0–35)
AST: 26 U/L (ref 0–37)
Albumin: 4 g/dL (ref 3.5–5.2)
Alkaline Phosphatase: 87 U/L (ref 39–117)
BUN: 8 mg/dL (ref 6–23)
CO2: 24 mEq/L (ref 19–32)
Calcium: 9.4 mg/dL (ref 8.4–10.5)
Chloride: 108 mEq/L (ref 96–112)
Creatinine, Ser: 0.58 mg/dL (ref 0.40–1.20)
GFR: 101.71 mL/min (ref >60.00)
Glucose, Bld: 104 mg/dL — ABNORMAL HIGH (ref 70–99)
Potassium: 4 mEq/L (ref 3.5–5.1)
Sodium: 141 mEq/L (ref 135–145)
Total Bilirubin: 0.7 mg/dL (ref 0.2–1.2)
Total Protein: 7.6 g/dL (ref 6.0–8.3)

## 2022-10-22 LAB — PROTIME-INR
INR: 1.2 ratio — ABNORMAL HIGH (ref 0.8–1.0)
Prothrombin Time: 12.8 s (ref 9.6–13.1)

## 2022-10-22 LAB — AMMONIA: Ammonia: 64 umol/L — ABNORMAL HIGH (ref 11–35)

## 2022-10-22 NOTE — Patient Instructions (Addendum)
Your provider has requested that you have an chest x ray before leaving today. Please go to the basement floor to our Radiology department for the test.   Your provider has requested that you go to the basement level for lab work before leaving today. Press "B" on the elevator. The lab is located at the first door on the left as you exit the elevator.  It has been recommended to you by your physician that you have a(n) EGD and colonoscopy with Dr. Havery Moros completed. Per your request, we did not schedule the procedure(s) today. Please contact our office at [phone] when you are ready to schedule those procedures. You will be scheduled for a pre-visit and procedure at that time.   Please go to the ER if you have any severe AB pain, unable to hold down food/water, blood in stool or vomit, chest pain, shortness of breath, or any worsening symptoms.   No aleve, ibuprofen, goody powders, as these are antiinflammatories and can cause inflammation in your stomach, increase bleeding risk and cause ulcers.  You can talk with PCP about alternative pain options.  Can do tyelnol max 3000 mg a day, salon pas patches are over the counter and voltern gel is topical antiinflammatory that is safe.   Costochondritis  Costochondritis is irritation and swelling (inflammation) of the tissue that connects the ribs to the breastbone (sternum). This tissue is called cartilage. Costochondritis causes pain in the front of the chest. Usually, the pain: Starts slowly. Is in more than one rib. What are the causes? The exact cause of this condition is not always known. It results from stress on the tissue in the affected area. The cause of this stress could be: Chest injury. Exercise or activity, such as lifting. Very bad coughing. What increases the risk? You are more likely to develop this condition if you: Are female. Are 55-2 years old. Recently started a new exercise or work activity. Have low levels of  vitamin D. Have a condition that makes you cough often. What are the signs or symptoms? The main symptom of this condition is chest pain. The pain: Usually starts slowly and can be sharp or dull. Gets worse with deep breathing, coughing, or exercise. Gets better with rest. May be worse when you press on the affected area of your ribs and breastbone. How is this treated? This condition usually goes away on its own over time. Your doctor may prescribe an NSAID, such as ibuprofen. This can help reduce pain and inflammation. Treatment may also include: Resting and avoiding activities that make pain worse. Putting heat or ice on the painful area. Doing exercises to stretch your chest muscles. If these treatments do not help, your doctor may inject a numbing medicine to help relieve the pain. Follow these instructions at home: Managing pain, stiffness, and swelling     If told, put ice on the painful area. To do this: Put ice in a plastic bag. Place a towel between your skin and the bag. Leave the ice on for 20 minutes, 2-3 times a day. If told, put heat on the affected area. Do this as often as told by your doctor. Use the heat source that your doctor recommends, such as a moist heat pack or a heating pad. Place a towel between your skin and the heat source. Leave the heat on for 20-30 minutes. Take off the heat if your skin turns bright red. This is very important if you cannot feel pain, heat, or  cold. You may have a greater risk of getting burned. Activity Rest as told by your doctor. Do not do anything that makes your pain worse. This includes any activities that use chest, belly (abdomen), and side muscles. Do not lift anything that is heavier than 10 lb (4.5 kg), or the limit that you are told, until your doctor says that it is safe. Return to your normal activities as told by your doctor. Ask your doctor what activities are safe for you. General instructions Take over-the-counter  and prescription medicines only as told by your doctor. Keep all follow-up visits as told by your doctor. This is important. Contact a doctor if: You have chills or a fever. Your pain does not go away or it gets worse. You have a cough that does not go away. Get help right away if: You are short of breath. You have very bad chest pain that is not helped by medicines, heat, or ice. These symptoms may be an emergency. Do not wait to see if the symptoms will go away. Get medical help right away. Call your local emergency services (911 in the [place] Do not drive yourself to the hospital. Summary Costochondritis is irritation and swelling (inflammation) of the tissue that connects the ribs to the breastbone (sternum). This condition causes pain in the front of the chest. Treatment may include medicines, rest, heat or ice, and exercises. This information is not intended to replace advice given to you by your health care provider. Make sure you discuss any questions you have with your health care provider. Document Revised: 03/06/2022 Document Reviewed: 10/30/2019 Elsevier Patient Education  [place] (ACETAMINOPHEN) IS SAFE IN LIVER DISEASE: You can take tylenol (acetaminophen) up to 2,000 mg/day. This would be four extra strength ('500mg'$ ) tablets over 24 hours OR six regular strength (325 mg) tablets over 24 hours. Please be sure to read the ingredients of over the counter medications and prescription pain medications as many contain acetaminophen.   NO NSAIDS (ibuprofen, advil, naproxen, aleve, motrin...)  HEPATIC ENCEPHALOPATHY HEPATIC ENCEPHALOPATHY: Confusion caused by a build up of toxins in the blood due to the liver not being able to filter toxins. This can cause confusion mild or severe, increase falls. If you have been diagnosed with Hepatic encephalopathy, advised to not drive due to increased risk   LACTULOSE:  helps pull ammonia and other toxins from the blood  into your stool when you have a bowel movement NO NEED TO CHECK AMMONIA LEVEL IN BLOOD! Only need to check if you are having symptoms. 30-55m up to four times a day Take a dose in the morning If by lunch time you have not had AT LEAST 2 bowel movements take another dose If by dinner you have not had AT LEAST 2 bowel movements that day take another dose If by bedtime you still have not had 2 bowel movements take another dose Goal of 3-4 bowel movements per day Avoid taking with food as this will cause more gas  IF YOU ARE VERY SLEEPY, HARD FOR YOUR FAMILY TO WAKE YOU UP, OR FALLING ASLEEP DURING CONVERSATIONS -INCREASE LACTULOSE/GO TO THE EMERGENCY ROOM  XIFAXAN (rifaximin) An antibiotic that helps limit excess bacteria in the intestine.  The bacteria can cause increased ammonia One '550mg'$  tablet twice daily  SUPPLEMENTS: multivitamin Zinc Branch Chain Amino acids (BCAA): 12 grams/day L-Ornithine L-Aspartate (LOLA): 6 grams three times/day ([email or web address]-armstrong.com/ L-Carnitine  PHYSICAL ACTIVITY It is important to continue to be active when  you have cirrhosis. Exercise will help reduce muscle loss and weakness.  DISCUSS REFERRAL FOR PHYSICAL THERAPY WITH YOUR PRIMARY CARE PROVIDER  DIET/NUTRITION FOR CIRRHOSIS NO ALCOHOL YOUR GOALS Evening snack - high protein Supplements between meals to help meet calorie and protein goal: Boost Ensure Premier Protein Shakes Protein Greek yogurt Fish, chicken (NO RAW OR UNDERCOOKED FISH/SHELLFISH) Avoid pork and red meat Plant based protein (non-soy)/Vegan: Lentils, Chickpeas, Peanuts (non salted), almonds (non salted), quinoa, chia seeds  Plant based protein supplements (not soy)  Avoid/limit animal based protein supplements: whey, casein 4.  Low sodium (2,000 mg/day) A. Avoid: table salt, canned foods, deli meats, sausages, hot dogs, anything with a long shelf life B. Read nutrition labels and be aware of serving size. Don't go by  percent of daily value.    Thank you for entrusting me with your care and for choosing [facility], Vicie Mutters, P.A.-C

## 2022-10-23 ENCOUNTER — Other Ambulatory Visit: Payer: Self-pay

## 2022-10-23 DIAGNOSIS — R1084 Generalized abdominal pain: Secondary | ICD-10-CM

## 2022-10-23 DIAGNOSIS — K7460 Unspecified cirrhosis of liver: Secondary | ICD-10-CM

## 2022-10-23 DIAGNOSIS — R7989 Other specified abnormal findings of blood chemistry: Secondary | ICD-10-CM

## 2022-10-23 LAB — D-DIMER, QUANTITATIVE: D-Dimer, Quant: 2.14 mcg/mL FEU — ABNORMAL HIGH (ref <0.50)

## 2022-10-23 LAB — PRO B NATRIURETIC PEPTIDE: NT-Pro BNP: 123 pg/mL (ref 0–287)

## 2022-10-23 LAB — AFP TUMOR MARKER: AFP-Tumor Marker: 2.5 ng/mL

## 2022-10-23 NOTE — Progress Notes (Signed)
Agree with assessment and plan as outlined.  

## 2022-10-26 ENCOUNTER — Telehealth: Payer: Self-pay | Admitting: Physician Assistant

## 2022-10-26 ENCOUNTER — Ambulatory Visit (HOSPITAL_COMMUNITY): Admission: RE | Admit: 2022-10-26 | Payer: 59 | Source: Ambulatory Visit

## 2022-10-26 ENCOUNTER — Telehealth: Payer: Self-pay

## 2022-10-26 ENCOUNTER — Other Ambulatory Visit: Payer: Self-pay

## 2022-10-26 DIAGNOSIS — R7989 Other specified abnormal findings of blood chemistry: Secondary | ICD-10-CM

## 2022-10-26 NOTE — Progress Notes (Signed)
Agree with addendum. Strongly recommended to have CTA and rule out PE given her symptom. She has declined our recommendations to do this and to seek further evaluation for it, and has been counseled extensively on risks of PE if untreated or undiagnosed. We will not schedule EGD or colonoscopy until this has been addressed. Recommended she see her PCP to further evaluate her symptoms.

## 2022-10-26 NOTE — Telephone Encounter (Signed)
OV note has been faxed to PCP.  Vladimir Crofts, PA-C  [place], Salvadore Dom, RN; [place], Carlota Raspberry, MD  Donita Brooks, can you please send this note to her PCP, she does not appear to be in Epic. Added the addenum.  Thanks for your help with this patient!

## 2022-10-26 NOTE — Telephone Encounter (Signed)
Myself or my nurse are discussed with the patient several times with her elevated D-dimer, right upper quadrant pleuritic chest pain, shortness of breath would advise CTA to evaluate for blood clot with recent foot surgery 2 to 3 months ago and smoking history. Discussed how this could potentially kill her, patient states she understands but is under is a lot of stress right now is unable to complete CTA and unable to proceed with EGD colonoscopy until after this has been evaluated properly. ER precautions reviewed with patient multiple times.

## 2022-10-26 NOTE — Telephone Encounter (Signed)
-----   Message from Carl Best, RN sent at 10/26/2022 10:42 AM EDT ----- Spoke with patient regarding scheduling endo colon. 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 She was advised of the importance of procedure & what it would be evaluating for, however if she still preferred to wait then to call back early December for January dates. She has CTA scheduled for 11/02/22. She plans to come in next week for d-dimer recheck.

## 2022-10-28 ENCOUNTER — Telehealth: Payer: Self-pay | Admitting: Physician Assistant

## 2022-10-28 NOTE — Telephone Encounter (Signed)
Patient called today after having been seen in the office on 10/22/2022.  She says she has been having worsening pain under her right breast, not particularly short of breath but hurts to take a deep breath.  She has not had any fever, no productive cough.  She does have a diagnosis of cirrhosis per [facility] notes patient had been advised to go to the emergency room if she had any worsening symptoms.  From 10/23 did show an elevated D-dimer-and it looks as if patient was scheduled for CT angio on 11/02/2022  Patient was advised to go to one of the emergency room this afternoon for further evaluation, she was reluctant to go to the ER due to knowing that there would be a long wait, advised her either to go to [place] or [facility] ER for the initial evaluation where she can be evaluated and have CT angio  She voiced understanding.

## 2022-10-30 ENCOUNTER — Other Ambulatory Visit: Payer: 59

## 2022-10-30 DIAGNOSIS — R7989 Other specified abnormal findings of blood chemistry: Secondary | ICD-10-CM

## 2022-10-31 LAB — D-DIMER, QUANTITATIVE: D-Dimer, Quant: 0.95 mcg/mL FEU — ABNORMAL HIGH (ref <0.50)

## 2022-11-02 ENCOUNTER — Encounter (HOSPITAL_COMMUNITY): Payer: Self-pay

## 2022-11-02 ENCOUNTER — Ambulatory Visit (HOSPITAL_COMMUNITY)
Admission: RE | Admit: 2022-11-02 | Discharge: 2022-11-02 | Disposition: A | Discharge status: Home / Self Care | Payer: 59 | Source: Ambulatory Visit | Attending: Physician Assistant | Admitting: Physician Assistant

## 2022-11-02 ENCOUNTER — Other Ambulatory Visit: Payer: Self-pay

## 2022-11-02 DIAGNOSIS — I700 Atherosclerosis of aorta: Secondary | ICD-10-CM

## 2022-11-02 DIAGNOSIS — R7989 Other specified abnormal findings of blood chemistry: Secondary | ICD-10-CM | POA: Diagnosis present

## 2022-11-02 DIAGNOSIS — I2510 Atherosclerotic heart disease of native coronary artery without angina pectoris: Secondary | ICD-10-CM

## 2022-11-02 DIAGNOSIS — K7460 Unspecified cirrhosis of liver: Secondary | ICD-10-CM

## 2022-11-02 DIAGNOSIS — R1084 Generalized abdominal pain: Secondary | ICD-10-CM | POA: Diagnosis present

## 2022-11-02 MED ORDER — IOHEXOL 350 MG/ML SOLN
100.0000 mL | Freq: Once | INTRAVENOUS | Status: AC | PRN
  Administered 2022-11-02: 100 mL via INTRAVENOUS

## 2022-11-02 MED ORDER — SODIUM CHLORIDE (PF) 0.9 % IJ SOLN
INTRAMUSCULAR | Status: AC
  Filled 2022-11-02: qty 50

## 2022-12-03 ENCOUNTER — Ambulatory Visit: Payer: 59 | Admitting: Cardiology

## 2022-12-03 ENCOUNTER — Encounter: Payer: Self-pay | Admitting: Cardiology

## 2022-12-03 ENCOUNTER — Ambulatory Visit: Payer: 59 | Attending: Cardiology | Admitting: Cardiology

## 2022-12-03 VITALS — BP 124/70 | HR 71 | Ht 61.75 in | Wt 182.0 lb

## 2022-12-03 DIAGNOSIS — R072 Precordial pain: Secondary | ICD-10-CM

## 2022-12-03 DIAGNOSIS — I2510 Atherosclerotic heart disease of native coronary artery without angina pectoris: Secondary | ICD-10-CM | POA: Diagnosis not present

## 2022-12-03 DIAGNOSIS — I700 Atherosclerosis of aorta: Secondary | ICD-10-CM

## 2022-12-03 DIAGNOSIS — L930 Discoid lupus erythematosus: Secondary | ICD-10-CM | POA: Diagnosis not present

## 2022-12-03 MED ORDER — METOPROLOL TARTRATE 50 MG PO TABS: 50.0000 mg | ORAL_TABLET | ORAL | 0 refills | Status: DC

## 2022-12-03 NOTE — Progress Notes (Signed)
Cardiology Office Note:    Date:  12/03/2022   ID:  Donna Sullivan, DOB 1966-05-07, MRN [id number]  PCP:  Donna Sullivan, [place] [facility] Cardiologist:  Donna Furbish, MD     Referring MD: Donna Crofts, PA-C    History of Present Illness:    Donna Sullivan is a 56 y.o. female here for the evaluation of coronary artery calcification/atherosclerosis as well as aortic atherosclerosis seen on a recent CT of the chest.  There was a small amount of calcification noted surrounding the origin of the left main artery.  Had chest pain worse with deep breath, stabbing.  went to Donna. Havery Sullivan. Plt were down, body produced ammonia. D-Dimer Has NASH. Not on statin.   Foot surgery. Lupus.  Tob now less pack a day. Crayon in hand to stop smoking.   Took care of Donna Sullivan patient who passed away 03-20-2020.   Past Medical History:  Diagnosis Date   Allergy    Anxiety    Asthma    Chronic kidney disease 1985   BLOCKED TUBE IN KIDNEY   Cirrhosis ([place])    secondary to NASH   Discoid lupus    Donna Sullivan, dermatologist   GERD (gastroesophageal reflux disease)    Headache(784.0)    MIGRAINES   Insomnia    Seizures ([place])    EPILEPSY- NO SEIZURES SINCE AGE 49   Sleep apnea    Tendonitis     Past Surgical History:  Procedure Laterality Date   ABDOMINAL HYSTERECTOMY     ANKLE RECONSTRUCTION  04/2009   APPENDECTOMY     CESAREAN SECTION     CHOLECYSTECTOMY     COLONOSCOPY WITH PROPOFOL N/A 05/22/2017   Procedure: COLONOSCOPY WITH PROPOFOL;  Surgeon: Donna Gunning, MD;  Location: [facility];  Service: Gastroenterology;  Laterality: N/A;   DIAGNOSTIC LAPAROSCOPY     KIDNEY SURGERY     right, straightened tube   LAPAROSCOPY  09/23/2012   Procedure: LAPAROSCOPY OPERATIVE;  Surgeon: Donna Fowler, MD;  Location: [facility];  Service: Gynecology;  Laterality: N/A;   RIGHT OOPHORECTOMY      Current Medications: Current Meds  Medication Sig   albuterol (PROVENTIL) (2.5  MG/3ML) 0.083% nebulizer solution Take 3 mLs (2.5 mg total) by nebulization every 6 (six) hours as needed for wheezing or shortness of breath.   dicyclomine (BENTYL) 20 MG tablet Take 1 tablet (20 mg total) by mouth 2 (two) times daily.   gabapentin (NEURONTIN) 300 MG capsule Take by mouth.   hydrOXYzine (ATARAX/VISTARIL) 25 MG tablet Take 0.5-1 tablets (12.5-25 mg total) by mouth every 8 (eight) hours as needed for itching.   ibuprofen (ADVIL) 800 MG tablet Take 1 tablet (800 mg total) by mouth every 6 (six) hours as needed.   ketorolac (TORADOL) 10 MG tablet Take 1 tablet (10 mg total) by mouth every 6 (six) hours as needed.   Lido-Capsaicin-Men-Methyl Sal (1ST MEDX-PATCH/ LIDOCAINE EX) Apply topically.   metoprolol tartrate (LOPRESSOR) 50 MG tablet Take 1 tablet (50 mg total) by mouth as directed. Take 1 tablet 2 hours before your CT scan   omeprazole (PRILOSEC) 40 MG capsule TAKE 1 CAPSULE BY MOUTH DAILY   valACYclovir (VALTREX) 1000 MG tablet Take 1 tablet (1,000 mg total) by mouth 3 (three) times daily.     Allergies:   Symbicort [budesonide-formoterol fumarate], Tramadol, Amoxicillin-pot clavulanate, Bactrim [sulfamethoxazole-trimethoprim], Ciprofloxacin hcl, Clindamycin/lincomycin, Cymbalta [duloxetine hcl], Doxycycline, Keflex [cephalexin], and Wellbutrin [bupropion]   Social History  Socioeconomic History   Marital status: Married    Spouse name: Not on file   Number of children: Not on file   Years of education: Not on file   Highest education level: Not on file  Occupational History   Not on file  Tobacco Use   Smoking status: Every Day    Packs/day: 0.50    Types: Cigarettes   Smokeless tobacco: Never   Tobacco comments:    Maybe later  Vaping Use   Vaping Use: Never used  Substance and Sexual Activity   Alcohol use: Yes    Comment: occ   Drug use: Yes    Types: Marijuana    Comment: cbd   Sexual activity: Not on file  Other Topics Concern   Not on file  Social  History Narrative   Lives with daughter.   Social Determinants of Health   Financial Resource Strain: Not on file  Food Insecurity: Not on file  Transportation Needs: Not on file  Physical Activity: Not on file  Stress: Not on file  Social Connections: Not on file     Family History: The patient's family history includes Colon cancer in her maternal grandfather; Coronary artery disease in her mother; Hypertension in her father and mother.  ROS:   Please see the history of present illness.    Denies any fevers chills nausea or syncope all other systems reviewed and are negative.  EKGs/Labs/Other Studies Reviewed:    The following studies were reviewed today: CT angiogram of chest performed in the emergency department personally reviewed and interpreted.  There is a left main ostial calcification surrounding the region/takeoff of the left main.  Aortic atherosclerosis also noted.  EKG:  The ekg ordered today demonstrates 12/03/2022-normal sinus rhythm 71 no other abnormalities.  Recent Labs: 10/22/2022: ALT 15; BUN 8; Creatinine, Ser 0.58; Hemoglobin 14.6; NT-Pro BNP 123; Platelets 129.0; Potassium 4.0; Sodium 141  Recent Lipid Panel    Component Value Date/Time   CHOL 221 (H) 04/10/2013 1625   TRIG 216 (H) 04/10/2013 1625   HDL 42 04/10/2013 1625   CHOLHDL 5.3 04/10/2013 1625   VLDL 43 (H) 04/10/2013 1625   LDLCALC 136 (H) 04/10/2013 1625     Risk Assessment/Calculations:               Physical Exam:    VS:  BP 124/70 (BP Location: Left Arm, Patient Position: Sitting, Cuff Size: Normal)   Pulse 71   Ht 5' 1.75" (1.568 m)   Wt 182 lb (82.6 kg)   BMI 33.56 kg/m     Wt Readings from Last 3 Encounters:  12/03/22 182 lb (82.6 kg)  10/22/22 178 lb 2 oz (80.8 kg)  09/19/22 170 lb (77.1 kg)     GEN:  Well nourished, well developed in no acute distress HEENT: Normal NECK: No JVD; No carotid bruits LYMPHATICS: No lymphadenopathy CARDIAC: RRR, no murmurs, rubs,  gallops RESPIRATORY:  Clear to auscultation without rales, wheezing or rhonchi  ABDOMEN: Soft, non-tender, non-distended MUSCULOSKELETAL:  No edema; No deformity  SKIN: Warm and dry NEUROLOGIC:  Alert and oriented x 3 PSYCHIATRIC:  Normal affect   ASSESSMENT:    1. Coronary artery calcification seen on CT scan   2. Precordial chest pain   3. Discoid lupus erythematosus   4. Aortic atherosclerosis (HCC)    PLAN:    In order of problems listed above:  Precordial chest pain/coronary artery calcification/atherosclerosis/aortic atherosclerosis - I will go ahead and check a dedicated  coronary CT scan with possible FFR analysis to further evaluate the calcified plaque close to the origin of the ostium of the left main artery.  I want to ensure that there is no high degree stenosis in that region which would completely change her management strategy.  In regards to her coronary artery calcification/aortic atherosclerosis, I would normally utilize aspirin 81 mg as well as statin therapy such as Crestor 10 mg however given her cirrhosis/nonalcoholic liver disease, we will avoid this type of pharmacotherapy.  Tobacco use - She is working hard on tobacco cessation.  In fact she states that she holds a crayon in her hand and occasionally puts it in her mouth to simulate a cigarette.  She has been able to cut back to half pack a day.  Lupus - Could play a role in her liver disease.  This is also a strong risk factor for atherosclerosis as well.          Medication Adjustments/Labs and Tests Ordered: Current medicines are reviewed at length with the patient today.  Concerns regarding medicines are outlined above.  Orders Placed This Encounter  Procedures   CT CORONARY MORPH W/CTA COR W/SCORE W/CA W/CM &/OR WO/CM   EKG 12-Lead   ECHOCARDIOGRAM COMPLETE   Meds ordered this encounter  Medications   metoprolol tartrate (LOPRESSOR) 50 MG tablet    Sig: Take 1 tablet (50 mg total) by mouth as  directed. Take 1 tablet 2 hours before your CT scan    Dispense:  1 tablet    Refill:  0    Patient Instructions  Medication Instructions:  The current medical regimen is effective;  continue present plan and medications.  *If you need a refill on your cardiac medications before your next appointment, please call your pharmacy*  Testing/Procedures: Your physician has requested that you have an echocardiogram. Echocardiography is a painless test that uses sound waves to create images of your heart. It provides your doctor with information about the size and shape of your heart and how well your heart's chambers and valves are working. This procedure takes approximately one hour. There are no restrictions for this procedure. Please do NOT wear cologne, perfume, aftershave, or lotions (deodorant is allowed). Please arrive 15 minutes prior to your appointment time.    Your cardiac CT will be scheduled at:   [facility] [address] [place], [place] [address] [phone]  Please arrive at the [facility] and Children's Entrance (Entrance C2) of [facility] 30 minutes prior to test start time. You can use the FREE valet parking offered at entrance C (encouraged to control the heart rate for the test)  Proceed to the [facility] Radiology Department (first floor) to check-in and test prep.  All radiology patients and guests should use entrance C2 at [facility], accessed from [facility], even though the hospital's physical address listed is [address].     Please follow these instructions carefully (unless otherwise directed):  On the Night Before the Test: Be sure to Drink plenty of water. Do not consume any caffeinated/decaffeinated beverages or chocolate 12 hours prior to your test. Do not take any antihistamines 12 hours prior to your test.   On the Day of the Test: Drink plenty of water until 1 hour prior to the test. Do  not eat any food 1 hour prior to test. You may take your regular medications prior to the test.  Take metoprolol (Lopressor) two hours  prior to test. HOLD Furosemide/Hydrochlorothiazide Sullivan of the test. FEMALES- please wear underwire-free bra if available, avoid dresses & tight clothing  After the Test: Drink plenty of water. After receiving IV contrast, you may experience a mild flushed feeling. This is normal. On occasion, you may experience a mild rash up to 24 hours after the test. This is not dangerous. If this occurs, you can take Benadryl 25 mg and increase your fluid intake. If you experience trouble breathing, this can be serious. If it is severe call 911 IMMEDIATELY. If it is mild, please call our office.  We will call to schedule your test 2-4 weeks out understanding that some insurance companies will need an authorization prior to the service being performed.   For non-scheduling related questions, please contact the cardiac imaging nurse navigator should you have any questions/concerns: Marchia Bond, Cardiac Imaging Nurse Navigator Gordy Clement, Cardiac Imaging Nurse Navigator [facility] Direct Office Dial: [phone]   For scheduling needs, including cancellations and rescheduling, please call [place], [phone].  Follow-Up: At [facility], you and your health needs are our priority.  As part of our continuing mission to provide you with exceptional heart care, we have created designated Provider Care Teams.  These Care Teams include your primary Cardiologist (physician) and Advanced Practice Providers (APPs -  Physician Assistants and Nurse Practitioners) who all work together to provide you with the care you need, when you need it.  We recommend signing up for the patient portal called "MyChart".  Sign up information is provided on this After Visit Summary.  MyChart is used to connect with patients for Virtual Visits  (Telemedicine).  Patients are able to view lab/test results, encounter notes, upcoming appointments, etc.  Non-urgent messages can be sent to your provider as well.   To learn more about what you can do with MyChart, go to [email or web address].    Your next appointment:   1 year(s)  The format for your next appointment:   In Person  Provider:   Candee Furbish, MD      Important Information About Sugar         Signed, Donna Furbish, MD  12/03/2022 5:09 PM    [place]

## 2022-12-03 NOTE — Patient Instructions (Addendum)
Medication Instructions:  The current medical regimen is effective;  continue present plan and medications.  *If you need a refill on your cardiac medications before your next appointment, please call your pharmacy*  Testing/Procedures: Your physician has requested that you have an echocardiogram. Echocardiography is a painless test that uses sound waves to create images of your heart. It provides your doctor with information about the size and shape of your heart and how well your heart's chambers and valves are working. This procedure takes approximately one hour. There are no restrictions for this procedure. Please do NOT wear cologne, perfume, aftershave, or lotions (deodorant is allowed). Please arrive 15 minutes prior to your appointment time.    Your cardiac CT will be scheduled at:   [facility] [address] [place], [place] [address] [phone]  Please arrive at the [facility] and Children's Entrance (Entrance C2) of [facility] 30 minutes prior to test start time. You can use the FREE valet parking offered at entrance C (encouraged to control the heart rate for the test)  Proceed to the [facility] Radiology Department (first floor) to check-in and test prep.  All radiology patients and guests should use entrance C2 at [facility], accessed from [facility], even though the hospital's physical address listed is [address]     Please follow these instructions carefully (unless otherwise directed):  On the Night Before the Test: Be sure to Drink plenty of water. Do not consume any caffeinated/decaffeinated beverages or chocolate 12 hours prior to your test. Do not take any antihistamines 12 hours prior to your test.   On the Day of the Test: Drink plenty of water until 1 hour prior to the test. Do not eat any food 1 hour prior to test. You may take your regular medications prior to the test.  Take metoprolol  (Lopressor) two hours prior to test. HOLD Furosemide/Hydrochlorothiazide morning of the test. FEMALES- please wear underwire-free bra if available, avoid dresses & tight clothing  After the Test: Drink plenty of water. After receiving IV contrast, you may experience a mild flushed feeling. This is normal. On occasion, you may experience a mild rash up to 24 hours after the test. This is not dangerous. If this occurs, you can take Benadryl 25 mg and increase your fluid intake. If you experience trouble breathing, this can be serious. If it is severe call 911 IMMEDIATELY. If it is mild, please call our office.  We will call to schedule your test 2-4 weeks out understanding that some insurance companies will need an authorization prior to the service being performed.   For non-scheduling related questions, please contact the cardiac imaging nurse navigator should you have any questions/concerns: Marchia Bond, Cardiac Imaging Nurse Navigator Gordy Clement, Cardiac Imaging Nurse Navigator [facility] Direct Office Dial: [phone]   For scheduling needs, including cancellations and rescheduling, please call [place], [phone].  Follow-Up: At [facility], you and your health needs are our priority.  As part of our continuing mission to provide you with exceptional heart care, we have created designated Provider Care Teams.  These Care Teams include your primary Cardiologist (physician) and Advanced Practice Providers (APPs -  Physician Assistants and Nurse Practitioners) who all work together to provide you with the care you need, when you need it.  We recommend signing up for the patient portal called "MyChart".  Sign up information is provided on this After Visit Summary.  MyChart is used to connect with patients for Virtual Visits (Telemedicine).  Patients are able to view lab/test results, encounter notes, upcoming appointments, etc.  Non-urgent  messages can be sent to your provider as well.   To learn more about what you can do with MyChart, go to [email or web address].    Your next appointment:   1 year(s)  The format for your next appointment:   In Person  Provider:   Candee Furbish, MD      Important Information About Sugar

## 2022-12-28 ENCOUNTER — Ambulatory Visit (HOSPITAL_COMMUNITY): Payer: 59 | Attending: Internal Medicine

## 2022-12-28 DIAGNOSIS — R072 Precordial pain: Secondary | ICD-10-CM | POA: Insufficient documentation

## 2022-12-28 LAB — ECHOCARDIOGRAM COMPLETE
Area-P 1/2: 2.5 cm2
S' Lateral: 2.5 cm

## 2023-01-01 ENCOUNTER — Telehealth: Payer: Self-pay

## 2023-01-01 NOTE — Telephone Encounter (Signed)
Spoke with patient review ECHO results. Patient verbalized understanding and had no questions.

## 2023-01-01 NOTE — Telephone Encounter (Signed)
-----   Message from Jerline Pain, MD sent at 12/30/2022  7:15 AM EST ----- Normal echocardiogram. Normal pump and valvular function. Candee Furbish, MD

## 2023-01-08 ENCOUNTER — Telehealth (HOSPITAL_COMMUNITY): Payer: Self-pay | Admitting: *Deleted

## 2023-01-08 NOTE — Telephone Encounter (Signed)
Attempted to call patient regarding upcoming cardiac CT appointment. Unable to leave a message but was able to send a SMS notification to patient.  Gordy Clement RN Navigator Cardiac Imaging [facility] [phone] Office [phone] Cell

## 2023-01-09 ENCOUNTER — Ambulatory Visit (HOSPITAL_COMMUNITY)
Admission: RE | Admit: 2023-01-09 | Discharge: 2023-01-09 | Disposition: A | Discharge status: Home / Self Care | Payer: 59 | Source: Ambulatory Visit | Attending: Cardiology | Admitting: Cardiology

## 2023-01-09 DIAGNOSIS — R072 Precordial pain: Secondary | ICD-10-CM | POA: Insufficient documentation

## 2023-01-09 MED ORDER — IOHEXOL 350 MG/ML SOLN
95.0000 mL | Freq: Once | INTRAVENOUS | Status: AC | PRN
  Administered 2023-01-09: 95 mL via INTRAVENOUS

## 2023-01-09 MED ORDER — NITROGLYCERIN 0.4 MG SL SUBL
SUBLINGUAL_TABLET | SUBLINGUAL | Status: AC
  Filled 2023-01-09: qty 2

## 2023-01-09 MED ORDER — NITROGLYCERIN 0.4 MG SL SUBL
0.8000 mg | SUBLINGUAL_TABLET | Freq: Once | SUBLINGUAL | Status: AC
  Administered 2023-01-09: 0.8 mg via SUBLINGUAL

## 2023-01-11 ENCOUNTER — Telehealth: Payer: Self-pay | Admitting: Cardiology

## 2023-01-11 NOTE — Telephone Encounter (Signed)
Patient called for her CT results.

## 2023-01-11 NOTE — Telephone Encounter (Signed)
Excellent coronary CT with no evidence of coronary artery disease.  Reassuring  Donna Furbish, MD   Pt is aware of results.  She is concerned that she is still having pain on her right side.  She reports the pain in constant.  At a level 8 on a scale of 1-10.  States it is hard to brathe and taking a deep breath causes a shooting pain that almost makes her want to cry.  She feels the pain in her chest on the right side where her bra goes.  HX: chronic bronchitis but no recent cough, no recent known injury to her chest.  She reports using a heating pad at night that helps a little.  She would like to know what else could be the cause of her pain.  Advised I will forward this to Dr Marlou Porch for review.  She is aware I will call back with any new orders/recommendations.

## 2023-01-22 NOTE — Telephone Encounter (Signed)
Pt to f/u with PCP per Dr Marlou Porch.

## 2023-05-01 ENCOUNTER — Other Ambulatory Visit: Payer: Self-pay

## 2023-05-01 DIAGNOSIS — K7460 Unspecified cirrhosis of liver: Secondary | ICD-10-CM

## 2023-05-16 ENCOUNTER — Telehealth: Payer: Self-pay | Admitting: Cardiology

## 2023-05-16 NOTE — Telephone Encounter (Signed)
Did you add a medication? No Has NASH   Did you remove a medication? No  Did you increase the dosage of any medication? No  Did you decrease the dosage of any medication? No  Did you refer to a specialist (i.e. lipid clinic, preventive cardiology, endocrinology)? No  Has patient seen plaque report? No

## 2023-05-17 ENCOUNTER — Other Ambulatory Visit (INDEPENDENT_AMBULATORY_CARE_PROVIDER_SITE_OTHER): Payer: 59

## 2023-05-17 DIAGNOSIS — K7460 Unspecified cirrhosis of liver: Secondary | ICD-10-CM | POA: Diagnosis not present

## 2023-05-17 LAB — PROTIME-INR
INR: 1.4 ratio — ABNORMAL HIGH (ref 0.8–1.0)
Prothrombin Time: 14.2 s — ABNORMAL HIGH (ref 9.6–13.1)

## 2023-05-18 ENCOUNTER — Emergency Department (HOSPITAL_COMMUNITY)
Admission: RE | Admit: 2023-05-18 | Discharge: 2023-05-18 | Disposition: A | Discharge status: Home / Self Care | Payer: 59 | Source: Ambulatory Visit | Attending: Physician Assistant | Admitting: Physician Assistant

## 2023-05-18 ENCOUNTER — Encounter (HOSPITAL_COMMUNITY): Payer: Self-pay

## 2023-05-18 ENCOUNTER — Emergency Department (HOSPITAL_COMMUNITY)
Admission: EM | Admit: 2023-05-18 | Discharge: 2023-05-18 | Disposition: A | Discharge status: Home / Self Care | Payer: 59 | Attending: Emergency Medicine | Admitting: Emergency Medicine

## 2023-05-18 ENCOUNTER — Emergency Department (HOSPITAL_COMMUNITY): Payer: 59

## 2023-05-18 ENCOUNTER — Other Ambulatory Visit: Payer: Self-pay

## 2023-05-18 DIAGNOSIS — R1011 Right upper quadrant pain: Secondary | ICD-10-CM | POA: Diagnosis present

## 2023-05-18 DIAGNOSIS — K7460 Unspecified cirrhosis of liver: Secondary | ICD-10-CM

## 2023-05-18 DIAGNOSIS — R1010 Upper abdominal pain, unspecified: Secondary | ICD-10-CM

## 2023-05-18 DIAGNOSIS — R509 Fever, unspecified: Secondary | ICD-10-CM | POA: Diagnosis not present

## 2023-05-18 DIAGNOSIS — R5383 Other fatigue: Secondary | ICD-10-CM | POA: Diagnosis not present

## 2023-05-18 LAB — COMPREHENSIVE METABOLIC PANEL
ALT: 26 U/L (ref 0–44)
AST: 38 U/L (ref 15–41)
Albumin: 3.5 g/dL (ref 3.5–5.0)
Alkaline Phosphatase: 87 U/L (ref 38–126)
Anion gap: 8 (ref 5–15)
BUN: 7 mg/dL (ref 6–20)
CO2: 23 mmol/L (ref 22–32)
Calcium: 8.6 mg/dL — ABNORMAL LOW (ref 8.9–10.3)
Chloride: 103 mmol/L (ref 98–111)
Creatinine, Ser: 0.72 mg/dL (ref 0.44–1.00)
GFR, Estimated: >60 mL/min (ref >60)
Glucose, Bld: 124 mg/dL — ABNORMAL HIGH (ref 70–99)
Potassium: 3.7 mmol/L (ref 3.5–5.1)
Sodium: 134 mmol/L — ABNORMAL LOW (ref 135–145)
Total Bilirubin: 1.3 mg/dL — ABNORMAL HIGH (ref 0.3–1.2)
Total Protein: 7.3 g/dL (ref 6.5–8.1)

## 2023-05-18 LAB — CBC WITH DIFFERENTIAL/PLATELET
HCT: 45 % (ref 36.0–46.0)
Hemoglobin: 15.1 g/dL — ABNORMAL HIGH (ref 12.0–15.0)
MCH: 30.3 pg (ref 26.0–34.0)
MCHC: 33.6 g/dL (ref 30.0–36.0)
MCV: 90.4 fL (ref 80.0–100.0)
Platelets: 95 10*3/uL — ABNORMAL LOW (ref 150–400)
RBC: 4.98 MIL/uL (ref 3.87–5.11)
RDW: 12.3 % (ref 11.5–15.5)
WBC: 6.8 10*3/uL (ref 4.0–10.5)
nRBC: 0 % (ref 0.0–0.2)

## 2023-05-18 LAB — URINALYSIS, ROUTINE W REFLEX MICROSCOPIC
Bilirubin Urine: NEGATIVE
Glucose, UA: NEGATIVE mg/dL
Hgb urine dipstick: NEGATIVE
Ketones, ur: NEGATIVE mg/dL
Leukocytes,Ua: NEGATIVE
Nitrite: NEGATIVE
Protein, ur: NEGATIVE mg/dL
Specific Gravity, Urine: 1.012 (ref 1.005–1.030)
pH: 5 (ref 5.0–8.0)

## 2023-05-18 LAB — LIPASE, BLOOD: Lipase: 34 U/L (ref 11–51)

## 2023-05-18 LAB — D-DIMER, QUANTITATIVE: D-Dimer, Quant: 0.78 ug/mL-FEU — ABNORMAL HIGH (ref 0.00–0.50)

## 2023-05-18 MED ORDER — IOHEXOL 350 MG/ML SOLN
100.0000 mL | Freq: Once | INTRAVENOUS | Status: AC | PRN
  Administered 2023-05-18: 100 mL via INTRAVENOUS

## 2023-05-18 MED ORDER — ONDANSETRON HCL 4 MG/2ML IJ SOLN
4.0000 mg | Freq: Once | INTRAMUSCULAR | Status: AC
  Administered 2023-05-18: 4 mg via INTRAVENOUS
  Filled 2023-05-18: qty 2

## 2023-05-18 MED ORDER — OXYCODONE-ACETAMINOPHEN 5-325 MG PO TABS: 1.0000 | ORAL_TABLET | Freq: Four times a day (QID) | ORAL | 0 refills | Status: DC | PRN

## 2023-05-18 MED ORDER — HYDROMORPHONE HCL 1 MG/ML IJ SOLN
0.5000 mg | Freq: Once | INTRAMUSCULAR | Status: AC
  Administered 2023-05-18: 0.5 mg via INTRAVENOUS
  Filled 2023-05-18: qty 1

## 2023-05-18 MED ORDER — SODIUM CHLORIDE 0.9 % IV BOLUS
500.0000 mL | Freq: Once | INTRAVENOUS | Status: AC
  Administered 2023-05-18: 500 mL via INTRAVENOUS

## 2023-05-18 NOTE — Discharge Instructions (Signed)
Follow-up with your GI doctor this week. ?

## 2023-05-18 NOTE — ED Triage Notes (Addendum)
Pt c/o intermittent RUQ abdominal pain, increasing fatigue x1 week and fever x1 day.  Pain score 8/10.  Denies respiratory, GI, and GU symptoms.  Hx of non-alcoholic cirrhosis and Lupus. Pt reports liver US just prior to arrival and blood work a few days ago but has not been given a result.       Pt reports fever of 101 this morning and took Tylenol.

## 2023-05-18 NOTE — ED Provider Notes (Signed)
[place] EMERGENCY DEPARTMENT AT [facility] Provider Note   CSN: [id number] Arrival date & time: 05/18/23  1129     History  Chief Complaint  Patient presents with   Fatigue   Fever    Donna Sullivan is a 57 y.o. female.  Patient has a history of cirrhosis.  She complains of right upper quadrant pain with mild fever  The history is provided by the patient and medical records. No language interpreter was used.  Fever Temp source:  Oral Severity:  Mild Onset quality:  Sudden Timing:  Intermittent Progression:  Waxing and waning Chronicity:  New Relieved by:  Nothing Worsened by:  Nothing Ineffective treatments:  None tried Associated symptoms: chest pain   Associated symptoms: no congestion, no cough, no diarrhea, no headaches and no rash        Home Medications Prior to Admission medications   Medication Sig Start Date End Date Taking? Authorizing Provider  oxyCODONE-acetaminophen (PERCOCET) 5-325 MG tablet Take 1 tablet by mouth every 6 (six) hours as needed. 05/18/23  Yes Bethann Berkshire, MD  albuterol (PROVENTIL) (2.5 MG/3ML) 0.083% nebulizer solution Take 3 mLs (2.5 mg total) by nebulization every 6 (six) hours as needed for wheezing or shortness of breath. 01/17/17   Marlon Pel, PA-C  dicyclomine (BENTYL) 20 MG tablet Take 1 tablet (20 mg total) by mouth 2 (two) times daily. 10/15/22   Armbruster, Willaim Rayas, MD  gabapentin (NEURONTIN) 300 MG capsule Take by mouth. 01/23/19   [provider]  hydrOXYzine (ATARAX/VISTARIL) 25 MG tablet Take 0.5-1 tablets (12.5-25 mg total) by mouth every 8 (eight) hours as needed for itching. 12/22/17   Rise Mu, PA-C  ibuprofen (ADVIL) 800 MG tablet Take 1 tablet (800 mg total) by mouth every 6 (six) hours as needed. 06/04/22   Candelaria Stagers, DPM  ketorolac (TORADOL) 10 MG tablet Take 1 tablet (10 mg total) by mouth every 6 (six) hours as needed. 06/13/22   Candelaria Stagers, DPM  Lido-Capsaicin-Men-Methyl  Sal (1ST MEDX-PATCH/ LIDOCAINE EX) Apply topically.    [provider]  metoprolol tartrate (LOPRESSOR) 50 MG tablet Take 1 tablet (50 mg total) by mouth as directed. Take 1 tablet 2 hours before your CT scan 12/03/22   Jake Bathe, MD  omeprazole (PRILOSEC) 40 MG capsule TAKE 1 CAPSULE BY MOUTH DAILY 09/05/18   Armbruster, Willaim Rayas, MD  valACYclovir (VALTREX) 1000 MG tablet Take 1 tablet (1,000 mg total) by mouth 3 (three) times daily. 11/26/17   Armbruster, Willaim Rayas, MD      Allergies    Symbicort [budesonide-formoterol fumarate], Tramadol, Amoxicillin-pot clavulanate, Bactrim [sulfamethoxazole-trimethoprim], Ciprofloxacin hcl, Clindamycin/lincomycin, Cymbalta [duloxetine hcl], Doxycycline, Keflex [cephalexin], and Wellbutrin [bupropion]    Review of Systems   Review of Systems  Constitutional:  Positive for fever. Negative for appetite change and fatigue.  HENT:  Negative for congestion, ear discharge and sinus pressure.   Eyes:  Negative for discharge.  Respiratory:  Negative for cough.   Cardiovascular:  Positive for chest pain.  Gastrointestinal:  Positive for abdominal pain. Negative for diarrhea.  Genitourinary:  Negative for frequency and hematuria.  Musculoskeletal:  Negative for back pain.  Skin:  Negative for rash.  Neurological:  Negative for seizures and headaches.  Psychiatric/Behavioral:  Negative for hallucinations.     Physical Exam Updated Vital Signs BP (!) 145/90   Pulse 63   Temp 99.1 F (37.3 C) (Oral)   Resp 18   Ht 5\' 1"  (1.549 m)  Wt 77.1 kg   SpO2 95%   BMI 32.12 kg/m  Physical Exam Vitals and nursing note reviewed.  Constitutional:      Appearance: She is well-developed.  HENT:     Head: Normocephalic.     Nose: Nose normal.  Eyes:     General: No scleral icterus.    Conjunctiva/sclera: Conjunctivae normal.  Neck:     Thyroid: No thyromegaly.  Cardiovascular:     Rate and Rhythm: Normal rate and regular rhythm.     Heart sounds:  No murmur heard.    No friction rub. No gallop.  Pulmonary:     Breath sounds: No stridor. No wheezing or rales.  Chest:     Chest wall: No tenderness.  Abdominal:     General: There is no distension.     Tenderness: There is abdominal tenderness. There is no rebound.  Musculoskeletal:        General: Normal range of motion.     Cervical back: Neck supple.  Lymphadenopathy:     Cervical: No cervical adenopathy.  Skin:    Findings: No erythema or rash.  Neurological:     Mental Status: She is alert and oriented to person, place, and time.     Motor: No abnormal muscle tone.     Coordination: Coordination normal.  Psychiatric:        Behavior: Behavior normal.     ED Results / Procedures / Treatments   Labs (all labs ordered are listed, but only abnormal results are displayed) Labs Reviewed  CBC WITH DIFFERENTIAL/PLATELET - Abnormal; Notable for the following components:      Result Value   Hemoglobin 15.1 (*)    Platelets 95 (*)    All other components within normal limits  COMPREHENSIVE METABOLIC PANEL - Abnormal; Notable for the following components:   Sodium 134 (*)    Glucose, Bld 124 (*)    Calcium 8.6 (*)    Total Bilirubin 1.3 (*)    All other components within normal limits  D-DIMER, QUANTITATIVE - Abnormal; Notable for the following components:   D-Dimer, Quant 0.78 (*)    All other components within normal limits  URINALYSIS, ROUTINE W REFLEX MICROSCOPIC  LIPASE, BLOOD    EKG None  Radiology CT ABDOMEN PELVIS W CONTRAST  Result Date: 05/18/2023 CLINICAL DATA:  One-week history of fatigue and intermittent right upper quadrant abdominal pain and 1 day of fever. EXAM: CT ABDOMEN AND PELVIS WITH CONTRAST TECHNIQUE: Multidetector CT imaging of the abdomen and pelvis was performed using the standard protocol following bolus administration of intravenous contrast. RADIATION DOSE REDUCTION: This exam was performed according to the departmental dose-optimization  program which includes automated exposure control, adjustment of the mA and/or kV according to patient size and/or use of iterative reconstruction technique. CONTRAST:  OMNIPAQUE IOHEXOL 350 MG/ML SOLN COMPARISON:  Ultrasound abdomen dated 05/18/2023, CT abdomen pelvis dated 09/19/2022 FINDINGS: Lower chest: No focal consolidation or pulmonary nodule in the lung bases. No pleural effusion or pneumothorax demonstrated. Partially imaged heart size is normal. Hepatobiliary: Nodular hepatic contour. No intra or extrahepatic biliary ductal dilation. Cholecystectomy. Pancreas: No focal lesions or main ductal dilation. Spleen: Enlarged, 15.3 cm, unchanged. Adrenals/Urinary Tract: No adrenal nodules. No suspicious renal mass, calculi or hydronephrosis. No focal bladder wall thickening. Stomach/Bowel: Normal appearance of the stomach. No evidence of bowel wall thickening, distention, or inflammatory changes. Colonic diverticulosis without acute diverticulitis. Appendectomy. Vascular/Lymphatic: Aortic atherosclerosis. No enlarged abdominal or pelvic lymph nodes. Reproductive: No  adnexal masses. Other: Trace perihepatic and pelvic free fluid. No free air or fluid collection. Musculoskeletal: No acute or abnormal lytic or blastic osseous lesions. IMPRESSION: 1. No acute abnormality in the abdomen or pelvis. 2. Nodular hepatic contour, splenomegaly, and trace perihepatic and pelvic free fluid, consistent with cirrhosis and portal hypertension. 3. Colonic diverticulosis without acute diverticulitis. 4.  Aortic Atherosclerosis (ICD10-I70.0). Electronically Signed   By: Agustin Cree M.D.   On: 05/18/2023 14:58   CT Angio Chest PE W and/or Wo Contrast  Result Date: 05/18/2023 CLINICAL DATA:  Pulmonary embolism (PE) suspected, high prob EXAM: CT ANGIOGRAPHY CHEST WITH CONTRAST TECHNIQUE: Multidetector CT imaging of the chest was performed using the standard protocol during bolus administration of intravenous contrast.  Multiplanar CT image reconstructions and MIPs were obtained to evaluate the vascular anatomy. RADIATION DOSE REDUCTION: This exam was performed according to the departmental dose-optimization program which includes automated exposure control, adjustment of the mA and/or kV according to patient size and/or use of iterative reconstruction technique. CONTRAST:  OMNIPAQUE IOHEXOL 350 MG/ML SOLN COMPARISON:  None Available. FINDINGS: Cardiovascular: Satisfactory opacification of the pulmonary arteries to the segmental level. No evidence of pulmonary embolism. Normal heart size. No pericardial effusion. Mediastinum/Nodes: No enlarged mediastinal, hilar, or axillary lymph nodes. Thyroid gland, trachea, and esophagus demonstrate no significant findings. Lungs/Pleura: Lungs are clear. No pleural effusion or pneumothorax. Upper Abdomen: No acute abnormality. Musculoskeletal: No chest wall abnormality. No acute or significant osseous findings. Review of the MIP images confirms the above findings. IMPRESSION: 1. No evidence of pulmonary embolism. 2. Clear lungs. Electronically Signed   By: Feliberto Harts M.D.   On: 05/18/2023 14:52   DG Chest Port 1 View  Result Date: 05/18/2023 CLINICAL DATA:  pain EXAM: PORTABLE CHEST 1 VIEW COMPARISON:  Chest x-ray October 22, 2022. FINDINGS: The heart size and mediastinal contours are within normal limits. Both lungs are clear. No visible pleural effusions or pneumothorax. No acute osseous abnormality. IMPRESSION: No active disease. Electronically Signed   By: Feliberto Harts M.D.   On: 05/18/2023 13:54   US Abdomen Limited RUQ (LIVER/GB)  Result Date: 05/18/2023 CLINICAL DATA:  Cirrhosis Screening for HCC EXAM: ULTRASOUND ABDOMEN LIMITED RIGHT UPPER QUADRANT COMPARISON:  CT abdomen pelvis 09/19/2022 FINDINGS: Gallbladder: Status post cholecystectomy Common bile duct: Diameter: 3 mm Liver: Parenchymal echogenicity: Diffusely increased Contours: Nodular Lesions: None  Portal vein: Patent.  Hepatopetal flow Other: None. IMPRESSION: Cirrhotic liver morphology without focal hepatic lesion. Electronically Signed   By: Acquanetta Belling M.D.   On: 05/18/2023 12:07    Procedures Procedures    Medications Ordered in ED Medications  sodium chloride 0.9 % bolus 500 mL (0 mLs Intravenous Stopped 05/18/23 1500)  HYDROmorphone (DILAUDID) injection 0.5 mg (0.5 mg Intravenous Given 05/18/23 1223)  ondansetron (ZOFRAN) injection 4 mg (4 mg Intravenous Given 05/18/23 1223)  iohexol (OMNIPAQUE) 350 MG/ML injection 100 mL (100 mLs Intravenous Contrast Given 05/18/23 1424)  HYDROmorphone (DILAUDID) injection 0.5 mg (0.5 mg Intravenous Given 05/18/23 1406)    ED Course/ Medical Decision Making/ A&P                             Medical Decision Making Amount and/or Complexity of Data Reviewed Labs: ordered. Radiology: ordered.  Risk Prescription drug management.  This patient presents to the ED for concern of abdominal pain, this involves an extensive number of treatment options, and is a complaint that carries with it a high  risk of complications and morbidity.  The differential diagnosis includes adhesions, abscess   Co morbidities that complicate the patient evaluation  Cirrhosis   Additional history obtained:  Additional history obtained from patient External records from outside source obtained and reviewed including hospital records   Lab Tests:  I Ordered, and personally interpreted labs.  The pertinent results include: CBC and chemistries unremarkable   Imaging Studies ordered:  I ordered imaging studies including CT chest and abdomen I independently visualized and interpreted imaging which showed cirrhosis I agree with the radiologist interpretation   Cardiac Monitoring: / EKG:  The patient was maintained on a cardiac monitor.  I personally viewed and interpreted the cardiac monitored which showed an underlying rhythm of: Normal sinus  rhythm   Consultations Obtained: No consultant  Problem List / ED Course / Critical interventions / Medication management  Abdominal pain and chest pain I ordered medication including Dilaudid for pain Reevaluation of the patient after these medicines showed that the patient improved I have reviewed the patients home medicines and have made adjustments as needed   Social Determinants of Health:  None   Test / Admission - Considered:  None  Patient with right upper quadrant pain and pain with inspiration.  CT chest and abdomen unremarkable.  Patient will be given pain medicines and will follow-up with GI        Final Clinical Impression(s) / ED Diagnoses Final diagnoses:  Pain of upper abdomen    Rx / DC Orders ED Discharge Orders          Ordered    oxyCODONE-acetaminophen (PERCOCET) 5-325 MG tablet  Every 6 hours PRN        05/18/23 1551              Bethann Berkshire, MD 05/18/23 1824

## 2023-05-20 ENCOUNTER — Telehealth: Payer: Self-pay | Admitting: Physician Assistant

## 2023-05-20 NOTE — Telephone Encounter (Signed)
Dr. Adela Lank please see note below.

## 2023-05-20 NOTE — Telephone Encounter (Signed)
Inbound call from patient, highly upset, patient states she would like to know the follow up from her [place] results and further plan of care. Patient states she does not want to speak to [facility], or a nurse. She would like Dr. Adela Lank to explain results to her. Patient states she is in pain and is seeing no improvements.

## 2023-05-22 NOTE — Telephone Encounter (Signed)
Patient is calling back pretty upset , she is requesting a call back regarding note below , patient stated that  if she don't hear back from anybody  no later than today,she will come with her attorneys..Please advise. Thanks

## 2023-05-22 NOTE — Telephone Encounter (Signed)
Pt scheduled to see Dr. Adela Lank 05/30/23 at 11:50am. Pt aware of appt.

## 2023-05-22 NOTE — Telephone Encounter (Signed)
I called this patient and spent approximately 20 minutes on the phone with her answering her questions. She has been having intermittent right upper quadrant pain under her breast/bra line.  She had an extensive evaluation with ultrasound, CT scan of her abdomen and chest recently in the ED.  She had labs done and they were normal.  She was adamant about being seen soon or talk with someone acutely about her issues, she was not happy that I was not able to get her in and that we did not have any appointments for several months and apparently told staff and threatened to bring her attorneys to our office.   I reviewed her recent ED course and listened to her history and symptoms.  She has had a low-grade fever ongoing for several days and does not feel well and fatigued.  I am not sure if she may have some viral illness causing that but I recommend she see her primary care for further evaluation of her fever as I do not see a clear cause for that on her workup.  I asked her about rash such as shingles etc. which could cause this and she does not endorse any rash, she has had shingles remotely.  She inquires if cirrhosis is causing her pain.  She has had cirrhosis for years.  I counseled her that the presence of cirrhosis itself should not cause pain.  I am not sure if she has a musculoskeletal injury or nerve pain which is causing her symptoms.  That is possible although it is really hard for me to say over the phone without examining her.  She is incredibly frustrated by her symptoms and lack of a clear answer.  I counseled her that over the phone it is very hard for me to give her a clear answer on this but my suspicion is that this could be musculoskeletal pain or perhaps some viral illness in addition making her feel poorly, costochondritis etc.  I will do my best to get her in the office next week when I am back, I counseled her I am not in the office this week and I am at the hospital.  I will touch base  with [facility] about coordinating an office visit with her next week, we will try to fit her in.  I otherwise counseled her that use of threatening language with our staff cannot continue and she can ask to speak with me directly if she wishes to ask questions in the future.  She understood and agreed with the plan.  I recommended trying some topical muscle rubs etc. for her abdominal pain in the interim, she states she is already done that without relief.  She agreed to call her primary care about workup for her fever.

## 2023-05-30 ENCOUNTER — Other Ambulatory Visit: Payer: 59

## 2023-05-30 ENCOUNTER — Encounter: Payer: Self-pay | Admitting: Gastroenterology

## 2023-05-30 ENCOUNTER — Ambulatory Visit: Payer: 59 | Admitting: Gastroenterology

## 2023-05-30 VITALS — BP 128/70 | HR 72 | Ht 61.0 in | Wt 182.1 lb

## 2023-05-30 DIAGNOSIS — M940 Chondrocostal junction syndrome [Tietze]: Secondary | ICD-10-CM

## 2023-05-30 DIAGNOSIS — K7460 Unspecified cirrhosis of liver: Secondary | ICD-10-CM

## 2023-05-30 DIAGNOSIS — Z86010 Personal history of colonic polyps: Secondary | ICD-10-CM

## 2023-05-30 DIAGNOSIS — D131 Benign neoplasm of stomach: Secondary | ICD-10-CM

## 2023-05-30 NOTE — Progress Notes (Signed)
HPI :  57 year old female here for a follow-up visit for right upper quadrant pain, and cirrhosis.  She also has a history of colon polyps.  She called in the other day complaining of some ongoing right upper quadrant pain, made her an opening into the schedule for a visit to address this today.  She has been having discomfort in the right costal margin since September 2023.  Starts underneath her bra line and extends down about the length of her hand on the right flank.  Pain is there all the time, never goes away.  Rates it today as 4 out of 10 but can fluctuate, can be more severe at times.  She does have some worsening with movements.  She is not sure if eating may make it worse at times.  It has really been bothersome to her, she has been seen in the ED a few times for this and has had several imaging studies to evaluate this over the past 6 months.  The ER physician told her she had cirrhosis causing her pain.  She is worried about worsening liver disease.  She states she was given Toradol for another pain in the past which definitely helped her side at the time.  She is tried some lidocaine patches at home which has not helped as much.  She uses a heating pad as needed which can take the edge off.  She states she has had a cardiac workup which was negative for this.  She takes an occasional NSAID but knows that they are not good for her so she really minimize the amount she takes.  She does not take much Tylenol.  She does not drink any alcohol.  As below, she has had 2 CT scans of her abdomen and pelvis since the pain started which have not revealed the diagnosis.  She had a right upper quadrant ultrasound, she is status post cholecystectomy.  She has had 2 CT scans of her chest as well as his cardiac CT.  None of these have revealed a cause for her symptoms.  In regards to her liver disease recall she has a history of cirrhosis secondary to NASH.  This been confirmed on liver biopsy in 2018.  She  initially was diagnosed with this when she was hospitalized for a diverticular bleed in May 2018 and was noted to incidentally have a cirrhotic appearing liver on imaging at that time.  She does not drink alcohol routinely.  No jaundice, no ascites on imaging.  She has had some elevated ammonia levels in the past but no overt encephalopathy.  No history of variceal bleeding.  Her last endoscopy was performed in 2018.  No varices.  She has had some mild thrombocytopenia.  INR 1.4.  Prior workup: Colonoscopy 06/28/2017 - normal ileum, diverticulosis - highest burden in the left colon, 5 polyps tranverse to cecum, multiple left sided hyperplastic polyps, internal hemorrhoids, biopsies taken no evidence of microscopic colitis - some of polyps from transverse to right colon were adenomas / sessile serrated, all of polyps on left side were hyperplastic polyps   Liver biopsy done 07/08/2017 - c/w cirrhosis secondary to NASH   EGD 12/27/2017 - 3 cm hiatal hernia, grade a esophagitis, single gastric polyp normal duodenum pathology showed short segment of Barrett's esophagus and gastric adenoma without dysplasia suggested started on omeprazole once daily.  Recall 1 year  1. Surgical [P], gastric polyp - GASTRIC ADENOMA, FOVEOLAR TYPE. - HIGH GRADE DYSPLASIA IS NOT IDENTIFIED. -  SEE COMMENT. 2. Surgical [P], GE junction - INTESTINAL METAPLASIA (GOBLET CELL METAPLASIA) CONSISTENT WITH BARRETT'S ESOPHAGUS. - THERE IS NO EVIDENCE OF DYSPLASIA OR MALIGNANCY. - SEE COMMENT.  Echo 12/28/22: EF 65-70%, grade I DD  Cardiac CT 12/2022 -  IMPRESSION: 1. Coronary calcium score of 0. 2. Normal coronary origin with right dominance. 3. Normal coronary arteries. 4. Small PFO. RECOMMENDATIONS: 1. No evidence of CAD (0%). Consider non-atherosclerotic causes of chest pain.   CT abdomen / pelvis 09/19/2022: IMPRESSION: 1. No acute intra-abdominal or pelvic pathology. 2. Sigmoid diverticulosis. No bowel  obstruction. 3. Cirrhosis with mild splenomegaly. 4. Aortic Atherosclerosis (ICD10-I70.0).   CTA 11/02/2022: IMPRESSION: 1. No pulmonary emboli or acute abnormality. 2. Mild calcific coronary artery and aortic atherosclerosis. 3. Changes of cirrhosis of the liver with splenomegaly.   RUQ [place] 05/28/2023: IMPRESSION: Cirrhotic liver morphology without focal hepatic lesion.   CTA 05/18/2023: IMPRESSION: 1. No evidence of pulmonary embolism. 2. Clear lungs.  CT abdomen / pelvis 05/18/2023: IMPRESSION: 1. No acute abnormality in the abdomen or pelvis. 2. Nodular hepatic contour, splenomegaly, and trace perihepatic and pelvic free fluid, consistent with cirrhosis and portal hypertension. 3. Colonic diverticulosis without acute diverticulitis. 4.  Aortic Atherosclerosis (ICD10-I70.0).   Past Medical History:  Diagnosis Date   Allergy    Anxiety    Asthma    Chronic kidney disease 1985   BLOCKED TUBE IN KIDNEY   Cirrhosis (HCC)    secondary to NASH   Discoid lupus    Dr Yetta Barre, dermatologist   GERD (gastroesophageal reflux disease)    Headache(784.0)    MIGRAINES   Insomnia    Seizures (HCC)    EPILEPSY- NO SEIZURES SINCE AGE 76   Sleep apnea    Tendonitis      Past Surgical History:  Procedure Laterality Date   ABDOMINAL HYSTERECTOMY     ANKLE RECONSTRUCTION  04/2009   APPENDECTOMY     CESAREAN SECTION     CHOLECYSTECTOMY     COLONOSCOPY WITH PROPOFOL N/A 05/22/2017   Procedure: COLONOSCOPY WITH PROPOFOL;  Surgeon: Ruffin Frederick, MD;  Location: [facility];  Service: Gastroenterology;  Laterality: N/A;   DIAGNOSTIC LAPAROSCOPY     FOOT SURGERY Right    bone removel fro pinky toe   KIDNEY SURGERY     right, straightened tube   LAPAROSCOPY  09/23/2012   Procedure: LAPAROSCOPY OPERATIVE;  Surgeon: Lavina Hamman, MD;  Location: [facility];  Service: Gynecology;  Laterality: N/A;   RIGHT OOPHORECTOMY     Family History  Problem Relation Age of Onset    Coronary artery disease Mother    Hypertension Mother    Hypertension Father    Colon cancer Maternal Grandfather    Social History   Tobacco Use   Smoking status: Every Day    Packs/day: .5    Types: Cigarettes   Smokeless tobacco: Never   Tobacco comments:    Maybe later  Vaping Use   Vaping Use: Never used  Substance Use Topics   Alcohol use: Yes    Comment: occ   Drug use: Yes    Types: Marijuana    Comment: cbd   Current Outpatient Medications  Medication Sig Dispense Refill   albuterol (PROVENTIL) (2.5 MG/3ML) 0.083% nebulizer solution Take 3 mLs (2.5 mg total) by nebulization every 6 (six) hours as needed for wheezing or shortness of breath. 75 mL 12   AMBULATORY NON FORMULARY MEDICATION [employer] [address] CBD ointment use twice a day  cetirizine (ZYRTEC) 10 MG tablet Take 10 mg by mouth daily.     CINNAMON PO Take 1 tablet by mouth daily.     Cyanocobalamin (VITAMIN B-12) 2500 MCG SUBL Place 1 tablet under the tongue daily.     diclofenac (VOLTAREN) 0.1 % ophthalmic solution 4 (four) times daily.     gabapentin (NEURONTIN) 300 MG capsule Take by mouth.     Ibuprofen 200 MG CAPS Take 1-2 capsules by mouth as needed.     Lido-Capsaicin-Men-Methyl Sal (1ST MEDX-PATCH/ LIDOCAINE EX) Apply topically.     Misc Natural Products (ELDERBERRY/VITAMIN C/ZINC PO) Take 1 tablet by mouth daily.     Multiple Vitamins-Minerals (HAIR SKIN & NAILS PO) Take 1 tablet by mouth daily.     Omega 3-6-9 Fatty Acids (OMEGA 3-6-9 PO) Take 1 tablet by mouth daily.     omeprazole (PRILOSEC) 40 MG capsule TAKE 1 CAPSULE BY MOUTH DAILY 90 capsule 1   Turmeric 500 MG CAPS Take 1 capsule by mouth daily.     oxyCODONE-acetaminophen (PERCOCET) 5-325 MG tablet Take 1 tablet by mouth every 6 (six) hours as needed. (Patient not taking: Reported on 05/30/2023) 20 tablet 0   No current facility-administered medications for this visit.   Allergies  Allergen Reactions   Symbicort  [Budesonide-Formoterol Fumarate] Other (See Comments)    Caused open sores in mouth    Tramadol Shortness Of Breath   Amoxicillin-Pot Clavulanate     REACTION: dizziness, fatigue/drowsiness and nausea   Bactrim [Sulfamethoxazole-Trimethoprim]     Blister in mouth   Ciprofloxacin Hcl     Blisters in mouth   Clindamycin/Lincomycin Other (See Comments)    Sores in mouth    Cymbalta [Duloxetine Hcl] Other (See Comments)    Per pt makes her "jacked up"   Doxycycline     Fatigue, sleepy, worse    Keflex [Cephalexin]     Blister in mouth   Wellbutrin [Bupropion]     Blisters in mouth     Review of Systems: All systems reviewed and negative except where noted in HPI.    CT ABDOMEN PELVIS W CONTRAST  Result Date: 05/18/2023 CLINICAL DATA:  One-week history of fatigue and intermittent right upper quadrant abdominal pain and 1 day of fever. EXAM: CT ABDOMEN AND PELVIS WITH CONTRAST TECHNIQUE: Multidetector CT imaging of the abdomen and pelvis was performed using the standard protocol following bolus administration of intravenous contrast. RADIATION DOSE REDUCTION: This exam was performed according to the departmental dose-optimization program which includes automated exposure control, adjustment of the mA and/or kV according to patient size and/or use of iterative reconstruction technique. CONTRAST:  OMNIPAQUE IOHEXOL 350 MG/ML SOLN COMPARISON:  Ultrasound abdomen dated 05/18/2023, CT abdomen pelvis dated 09/19/2022 FINDINGS: Lower chest: No focal consolidation or pulmonary nodule in the lung bases. No pleural effusion or pneumothorax demonstrated. Partially imaged heart size is normal. Hepatobiliary: Nodular hepatic contour. No intra or extrahepatic biliary ductal dilation. Cholecystectomy. Pancreas: No focal lesions or main ductal dilation. Spleen: Enlarged, 15.3 cm, unchanged. Adrenals/Urinary Tract: No adrenal nodules. No suspicious renal mass, calculi or hydronephrosis. No focal bladder  wall thickening. Stomach/Bowel: Normal appearance of the stomach. No evidence of bowel wall thickening, distention, or inflammatory changes. Colonic diverticulosis without acute diverticulitis. Appendectomy. Vascular/Lymphatic: Aortic atherosclerosis. No enlarged abdominal or pelvic lymph nodes. Reproductive: No adnexal masses. Other: Trace perihepatic and pelvic free fluid. No free air or fluid collection. Musculoskeletal: No acute or abnormal lytic or blastic osseous lesions. IMPRESSION: 1. No acute abnormality  in the abdomen or pelvis. 2. Nodular hepatic contour, splenomegaly, and trace perihepatic and pelvic free fluid, consistent with cirrhosis and portal hypertension. 3. Colonic diverticulosis without acute diverticulitis. 4.  Aortic Atherosclerosis (ICD10-I70.0). Electronically Signed   By: Agustin Cree M.D.   On: 05/18/2023 14:58   CT Angio Chest PE W and/or Wo Contrast  Result Date: 05/18/2023 CLINICAL DATA:  Pulmonary embolism (PE) suspected, high prob EXAM: CT ANGIOGRAPHY CHEST WITH CONTRAST TECHNIQUE: Multidetector CT imaging of the chest was performed using the standard protocol during bolus administration of intravenous contrast. Multiplanar CT image reconstructions and MIPs were obtained to evaluate the vascular anatomy. RADIATION DOSE REDUCTION: This exam was performed according to the departmental dose-optimization program which includes automated exposure control, adjustment of the mA and/or kV according to patient size and/or use of iterative reconstruction technique. CONTRAST:  OMNIPAQUE IOHEXOL 350 MG/ML SOLN COMPARISON:  None Available. FINDINGS: Cardiovascular: Satisfactory opacification of the pulmonary arteries to the segmental level. No evidence of pulmonary embolism. Normal heart size. No pericardial effusion. Mediastinum/Nodes: No enlarged mediastinal, hilar, or axillary lymph nodes. Thyroid gland, trachea, and esophagus demonstrate no significant findings. Lungs/Pleura: Lungs are  clear. No pleural effusion or pneumothorax. Upper Abdomen: No acute abnormality. Musculoskeletal: No chest wall abnormality. No acute or significant osseous findings. Review of the MIP images confirms the above findings. IMPRESSION: 1. No evidence of pulmonary embolism. 2. Clear lungs. Electronically Signed   By: Feliberto Harts M.D.   On: 05/18/2023 14:52   DG Chest Port 1 View  Result Date: 05/18/2023 CLINICAL DATA:  pain EXAM: PORTABLE CHEST 1 VIEW COMPARISON:  Chest x-ray October 22, 2022. FINDINGS: The heart size and mediastinal contours are within normal limits. Both lungs are clear. No visible pleural effusions or pneumothorax. No acute osseous abnormality. IMPRESSION: No active disease. Electronically Signed   By: Feliberto Harts M.D.   On: 05/18/2023 13:54   US Abdomen Limited RUQ (LIVER/GB)  Result Date: 05/18/2023 CLINICAL DATA:  Cirrhosis Screening for HCC EXAM: ULTRASOUND ABDOMEN LIMITED RIGHT UPPER QUADRANT COMPARISON:  CT abdomen pelvis 09/19/2022 FINDINGS: Gallbladder: Status post cholecystectomy Common bile duct: Diameter: 3 mm Liver: Parenchymal echogenicity: Diffusely increased Contours: Nodular Lesions: None Portal vein: Patent.  Hepatopetal flow Other: None. IMPRESSION: Cirrhotic liver morphology without focal hepatic lesion. Electronically Signed   By: Acquanetta Belling M.D.   On: 05/18/2023 12:07     MELD 3.0: 14 at 05/18/2023 12:25 PM MELD-Na: 11 at 05/18/2023 12:25 PM Calculated from: Serum Creatinine: 0.72 mg/dL (Using min of 1 mg/dL) at 0/98/1191 47:82 PM Serum Sodium: 134 mmol/L at 05/18/2023 12:25 PM Total Bilirubin: 1.3 mg/dL at 9/56/2130 86:57 PM Serum Albumin: 3.5 g/dL at 8/46/9629 52:84 PM INR(ratio): 1.4 ratio at 05/17/2023 12:46 PM Age at listing (hypothetical): 57 years Sex: Female at 05/18/2023 12:25 PM  Lab Results  Component Value Date   WBC 6.8 05/18/2023   HGB 15.1 (H) 05/18/2023   HCT 45.0 05/18/2023   MCV 90.4 05/18/2023   PLT 95 (L) 05/18/2023     Lab Results  Component Value Date   CREATININE 0.72 05/18/2023   BUN 7 05/18/2023   NA 134 (L) 05/18/2023   K 3.7 05/18/2023   CL 103 05/18/2023   CO2 23 05/18/2023    Lab Results  Component Value Date   ALT 26 05/18/2023   AST 38 05/18/2023   ALKPHOS 87 05/18/2023   BILITOT 1.3 (H) 05/18/2023    Lab Results  Component Value Date   INR 1.4 (H)  05/17/2023   INR 1.2 (H) 10/22/2022   INR 1.09 12/22/2017     Physical Exam: BP 128/70 (BP Location: Left Arm, Patient Position: Sitting, Cuff Size: Large)   Pulse 72   Ht 5\' 1"  (1.549 m)   Wt 182 lb 2 oz (82.6 kg)   BMI 34.41 kg/m  Constitutional: Pleasant,well-developed, female in no acute distress. HEENT: Normocephalic and atraumatic. Conjunctivae are normal. No scleral icterus. Neck supple.  Cardiovascular: Normal rate, regular rhythm.  Pulmonary/chest: Effort normal and breath sounds normal.  Abdominal: Soft, nondistended. Focal TTP along R costal margin starting proximally over bra line extending down over lower costal margin. CMA Elon Spanner as standby. . There are no masses palpable. No hepatomegaly. Mild lower abdominal TTP. Extremities: no edema Lymphadenopathy: No cervical adenopathy noted. Neurological: Alert and oriented to person place and time. Skin: Skin is warm and dry. No rashes noted. Psychiatric: Normal mood and affect. Behavior is normal.   ASSESSMENT: 57 y.o. female here for assessment of the following  1. Costochondritis   2. Cirrhosis of liver without ascites, unspecified hepatic cirrhosis type (HCC)   3. History of colon polyps   4. Gastric adenoma    History as outlined above.  Several months worth of ongoing persistent chronic discomfort overlying her rib cage on the right side.  She has had negative CT scan abdomen x 2, CT chest x 2, cardiac CT, right upper quadrant ultrasound.  Symptoms and exam is consistent with costochondritis/musculoskeletal pain.  We discussed what this is.  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  I wonder if any of these repetitive motions is making it worse.  We discussed options.  Unfortunately she is not a good candidate for NSAIDs given her liver disease.  She can use low-dose Tylenol as needed.  She has tried some topical muscle rubs etc. which has not helped.  I provided some educational handouts about this as well as some stretching exercises which may help.  I will refer her to sports medicine to consider injection therapy as well.  She agrees with the plan and understands.  I reassured her that her liver is not causing this pain based on the workup we can see to date and exam findings.  I reviewed her cirrhosis of the liver with her.  INR has drifted up over the years, otherwise she has no overt decompensations.  She is due for varices screening, has mild thrombocytopenia.  Recommend EGD to evaluate for this.  She is also overdue for EGD in light of gastric adenoma on her last exam.  She is also overdue for surveillance colonoscopy for history of colon polyps.  We discussed EGD and colonoscopy, risks of these procedures and benefits, as well as that of anesthesia.  She wishes to proceed after discussion.  Will make sure nothing else in her upper tract there to cause her symptoms of pain but I think that would be less likely.  Otherwise due for AFP level for HCC screening.  Her imaging shows no hepatomas.  Counseled her on risks for hepatic decompensation and HCC moving forward and she needs to see [place] every 6 months moving forward for these issues.  She understands.  PLAN: - suspected costochrondritis - educational handouts give, stretches provided - no NSAIDs if possible - tylenol is okay 500mg  once to twice daily - refer to sports medicine - consider injection therapy for ongoing persistent symptoms - EGD for varices screening and colonoscopy for polyp history in  the [facility] - recall RUQ [place] in November 2024 - AFP now, and then CBC, CMET, INR, AFP in 6  months - f/u December for cirrhosis  Harlin Rain, MD [facility]

## 2023-05-30 NOTE — Patient Instructions (Addendum)
If your blood pressure at your visit was 140/90 or greater, please contact your primary care physician to follow up on this. ______________________________________________________  If you are age 56 or older, your body mass index should be between 23-30. Your Body mass index is 34.41 kg/m. If this is out of the aforementioned range listed, please consider follow up with your Primary Care Provider.  If you are age 87 or younger, your body mass index should be between 19-25. Your Body mass index is 34.41 kg/m. If this is out of the aformentioned range listed, please consider follow up with your Primary Care Provider.  ________________________________________________________  The  GI providers would like to encourage you to use [facility] to communicate with providers for non-urgent requests or questions.  Due to long hold times on the telephone, sending your provider a message by [facility] may be a faster and more efficient way to get a response.  Please allow 48 business hours for a response.  Please remember that this is for non-urgent requests.  _______________________________________________________  Due to recent changes in healthcare laws, you may see the results of your imaging and laboratory studies on MyChart before your provider has had a chance to review them.  We understand that in some cases there may be results that are confusing or concerning to you. Not all laboratory results come back in the same time frame and the provider may be waiting for multiple results in order to interpret others.  Please give Donna Sullivan 48 hours in order for your provider to thoroughly review all the results before contacting the office for clarification of your results.   It has been reccommended that you have an endoscopy and colonoscopy. Please call Donna Sullivan to schedule these procedures when you have your calendars available: [phone].  We will use [employer] and mail you your instructions.  We are giving you  information about costochondritis with stretches  Stop all NSAIDs. Use 500 mg Tylenol once to twice a day.  Please go to the lab in the basement of our building to have lab work done as you leave today. Hit "B" for basement when you get on the elevator.  When the doors open the lab is on your left.  We will call you with the results. Thank you.  You will be due for an ultrasound of your liver and additional labs in November. We will reach out to you when it is time to schedule.  Please follow up in December.   We are referring you to [facility].  They will contact you directly to schedule an appointment.  It may take a week or more before you hear from them.  Please feel free to contact Donna Sullivan if you have not heard from them within 2 weeks and we will follow up on the referral.    Thank you for entrusting me with your care and for choosing [facility], Dr. Ileene Patrick

## 2023-06-03 LAB — AFP TUMOR MARKER: AFP-Tumor Marker: 2.7 ng/mL

## 2023-06-04 ENCOUNTER — Telehealth: Payer: Self-pay | Admitting: Gastroenterology

## 2023-06-04 NOTE — Telephone Encounter (Signed)
PT is calling to discuss lab results. Please advise. 

## 2023-06-04 NOTE — Telephone Encounter (Signed)
Called pt. LM with results and recommendations.

## 2023-06-12 ENCOUNTER — Encounter: Payer: Self-pay | Admitting: Family Medicine

## 2023-06-27 ENCOUNTER — Telehealth: Payer: Self-pay

## 2023-06-27 NOTE — Telephone Encounter (Signed)
Called and left message for patient that we must have forgotten to cancel the original appointment she had when we get her in sooner with [facility] on 5-30.  She is not due again until December.  She does still need to schedule her ECL so I asked that she call back to schedule and will be given a date for PV to be instructed.  Appointment on 7-5 cancelled

## 2023-07-05 ENCOUNTER — Ambulatory Visit: Payer: 59 | Admitting: Gastroenterology

## 2023-09-08 ENCOUNTER — Encounter (HOSPITAL_COMMUNITY): Payer: Self-pay

## 2023-09-08 ENCOUNTER — Emergency Department (HOSPITAL_COMMUNITY): Payer: 59

## 2023-09-08 ENCOUNTER — Other Ambulatory Visit: Payer: Self-pay

## 2023-09-08 ENCOUNTER — Emergency Department (HOSPITAL_COMMUNITY)
Admission: EM | Admit: 2023-09-08 | Discharge: 2023-09-08 | Disposition: A | Discharge status: Home / Self Care | Payer: 59 | Attending: Emergency Medicine | Admitting: Emergency Medicine

## 2023-09-08 DIAGNOSIS — Z20822 Contact with and (suspected) exposure to covid-19: Secondary | ICD-10-CM | POA: Diagnosis not present

## 2023-09-08 DIAGNOSIS — R519 Headache, unspecified: Secondary | ICD-10-CM | POA: Insufficient documentation

## 2023-09-08 DIAGNOSIS — R404 Transient alteration of awareness: Secondary | ICD-10-CM | POA: Diagnosis not present

## 2023-09-08 DIAGNOSIS — E7220 Disorder of urea cycle metabolism, unspecified: Secondary | ICD-10-CM | POA: Diagnosis not present

## 2023-09-08 DIAGNOSIS — R4182 Altered mental status, unspecified: Secondary | ICD-10-CM | POA: Diagnosis present

## 2023-09-08 LAB — CBC WITH DIFFERENTIAL/PLATELET
Abs Immature Granulocytes: 0.02 10*3/uL (ref 0.00–0.07)
Basophils Absolute: 0 10*3/uL (ref 0.0–0.1)
Basophils Relative: 0 %
Eosinophils Absolute: 0.3 10*3/uL (ref 0.0–0.5)
Eosinophils Relative: 4 %
HCT: 46.1 % — ABNORMAL HIGH (ref 36.0–46.0)
Hemoglobin: 15.7 g/dL — ABNORMAL HIGH (ref 12.0–15.0)
Immature Granulocytes: 0 %
Lymphocytes Relative: 23 %
Lymphs Abs: 1.5 10*3/uL (ref 0.7–4.0)
MCH: 31.1 pg (ref 26.0–34.0)
MCHC: 34.1 g/dL (ref 30.0–36.0)
MCV: 91.3 fL (ref 80.0–100.0)
Monocytes Absolute: 0.4 10*3/uL (ref 0.1–1.0)
Monocytes Relative: 6 %
Neutro Abs: 4.4 10*3/uL (ref 1.7–7.7)
Neutrophils Relative %: 67 %
Platelets: 109 10*3/uL — ABNORMAL LOW (ref 150–400)
RBC: 5.05 MIL/uL (ref 3.87–5.11)
RDW: 12.2 % (ref 11.5–15.5)
WBC: 6.7 10*3/uL (ref 4.0–10.5)
nRBC: 0 % (ref 0.0–0.2)

## 2023-09-08 LAB — COMPREHENSIVE METABOLIC PANEL
ALT: 20 U/L (ref 0–44)
AST: 29 U/L (ref 15–41)
Albumin: 3.5 g/dL (ref 3.5–5.0)
Alkaline Phosphatase: 91 U/L (ref 38–126)
Anion gap: 10 (ref 5–15)
BUN: 8 mg/dL (ref 6–20)
CO2: 21 mmol/L — ABNORMAL LOW (ref 22–32)
Calcium: 9.3 mg/dL (ref 8.9–10.3)
Chloride: 109 mmol/L (ref 98–111)
Creatinine, Ser: 0.46 mg/dL (ref 0.44–1.00)
GFR, Estimated: >60 mL/min (ref >60)
Glucose, Bld: 187 mg/dL — ABNORMAL HIGH (ref 70–99)
Potassium: 3.5 mmol/L (ref 3.5–5.1)
Sodium: 140 mmol/L (ref 135–145)
Total Bilirubin: 0.6 mg/dL (ref 0.3–1.2)
Total Protein: 7.5 g/dL (ref 6.5–8.1)

## 2023-09-08 LAB — RESP PANEL BY RT-PCR (RSV, FLU A&B, COVID)  RVPGX2
Influenza A by PCR: NEGATIVE
Influenza B by PCR: NEGATIVE
Resp Syncytial Virus by PCR: NEGATIVE
SARS Coronavirus 2 by RT PCR: NEGATIVE

## 2023-09-08 LAB — LIPASE, BLOOD: Lipase: 34 U/L (ref 11–51)

## 2023-09-08 LAB — RAPID URINE DRUG SCREEN, HOSP PERFORMED
Amphetamines: NOT DETECTED
Barbiturates: NOT DETECTED
Benzodiazepines: NOT DETECTED
Cocaine: NOT DETECTED
Opiates: NOT DETECTED
Tetrahydrocannabinol: POSITIVE — AB

## 2023-09-08 LAB — CBG MONITORING, ED: Glucose-Capillary: 127 mg/dL — ABNORMAL HIGH (ref 70–99)

## 2023-09-08 LAB — URINALYSIS, W/ REFLEX TO CULTURE (INFECTION SUSPECTED)
Bilirubin Urine: NEGATIVE
Glucose, UA: NEGATIVE mg/dL
Hgb urine dipstick: NEGATIVE
Ketones, ur: NEGATIVE mg/dL
Leukocytes,Ua: NEGATIVE
Nitrite: NEGATIVE
Protein, ur: NEGATIVE mg/dL
Specific Gravity, Urine: 1.005 (ref 1.005–1.030)
pH: 7 (ref 5.0–8.0)

## 2023-09-08 LAB — I-STAT CG4 LACTIC ACID, ED: Lactic Acid, Venous: 0.8 mmol/L (ref 0.5–1.9)

## 2023-09-08 LAB — ETHANOL: Alcohol, Ethyl (B): <10 mg/dL (ref <10)

## 2023-09-08 LAB — AMMONIA: Ammonia: 37 umol/L — ABNORMAL HIGH (ref 9–35)

## 2023-09-08 MED ORDER — MORPHINE SULFATE (PF) 4 MG/ML IV SOLN
4.0000 mg | Freq: Once | INTRAVENOUS | Status: AC
  Administered 2023-09-08: 4 mg via INTRAVENOUS
  Filled 2023-09-08: qty 1

## 2023-09-08 MED ORDER — ONDANSETRON HCL 4 MG/2ML IJ SOLN
4.0000 mg | Freq: Once | INTRAMUSCULAR | Status: AC
  Administered 2023-09-08: 4 mg via INTRAVENOUS
  Filled 2023-09-08: qty 2

## 2023-09-08 NOTE — ED Triage Notes (Signed)
Pt arrived POV with husband. Patient husband states she has been confused since she woke up this morning. States has been asking questions that are not making sense. Patient reports bad left side headache. Also states hx liver cirrhosis. Denies dizziness, N/v, speech problems or any balance issues.

## 2023-09-08 NOTE — Discharge Instructions (Signed)
Return for any problem.  ?

## 2023-09-08 NOTE — ED Provider Notes (Signed)
Donna Sullivan   CSN: [id number] Arrival date & time: 09/08/23  1102     History  Chief Complaint  Patient presents with   Altered Mental Status    Donna Sullivan is a 57 y.o. female.  57 year old female with prior medical history as detailed below presents for evaluation.  She is accompanied by her husband.  Per the husband the patient seemed to be more confused this morning than she normally is.  Patient apparently thought that the couples 58 year old daughter was living with them.  The husband reports that this is not the case.  The patient's confusion was noticed after she woke up around 10 AM.  Over the intervening time her symptoms have improved significantly.  On arrival to the ED the patient denies confusion.  She reports a mild headache.  She otherwise is without complaint.  The history is provided by the patient and medical records.       Home Medications Prior to Admission medications   Medication Sig Start Date End Date Taking? Authorizing Provider  cetirizine (ZYRTEC) 10 MG tablet Take 10 mg by mouth daily.   Yes [provider]  CINNAMON PO Take 1 tablet by mouth daily.   Yes [provider]  Cyanocobalamin (VITAMIN B-12) 2500 MCG SUBL Place 1 tablet under the tongue daily.   Yes [provider]  gabapentin (NEURONTIN) 300 MG capsule Take 900 mg by mouth at bedtime. 01/23/19  Yes [provider]  Misc Natural Products (ELDERBERRY/VITAMIN C/ZINC PO) Take 1 tablet by mouth daily.   Yes [provider]  Multiple Vitamins-Minerals (HAIR SKIN & NAILS PO) Take 1 tablet by mouth daily.   Yes [provider]  Omega 3-6-9 Fatty Acids (OMEGA 3-6-9 PO) Take 1 tablet by mouth daily.   Yes [provider]  omeprazole (PRILOSEC) 40 MG capsule TAKE 1 CAPSULE BY MOUTH DAILY Patient taking differently: Take 40 mg by mouth daily. 09/05/18  Yes Armbruster, Willaim Rayas, MD   oxyCODONE (OXY IR/ROXICODONE) 5 MG immediate release tablet Take 5 mg by mouth every 4 (four) hours as needed for severe pain.   Yes [provider]  Turmeric 500 MG CAPS Take 1 capsule by mouth daily.   Yes [provider]  albuterol (PROVENTIL) (2.5 MG/3ML) 0.083% nebulizer solution Take 3 mLs (2.5 mg total) by nebulization every 6 (six) hours as needed for wheezing or shortness of breath. Patient not taking: Reported on 09/08/2023 01/17/17   Marlon Pel, PA-C  oxyCODONE-acetaminophen (PERCOCET) 5-325 MG tablet Take 1 tablet by mouth every 6 (six) hours as needed. Patient not taking: Reported on 05/30/2023 05/18/23   Bethann Berkshire, MD      Allergies    Symbicort [budesonide-formoterol fumarate], Tramadol, Amoxicillin-pot clavulanate, Azithromycin, Bactrim [sulfamethoxazole-trimethoprim], Ciprofloxacin hcl, Clindamycin/lincomycin, Cymbalta [duloxetine hcl], Doxycycline, Keflex [cephalexin], and Wellbutrin [bupropion]    Review of Systems   Review of Systems  All other systems reviewed and are negative.   Physical Exam Updated Vital Signs BP 137/83   Pulse 73   Resp 18   Ht 5\' 1"  (1.549 m)   Wt 82.6 kg   SpO2 95%   BMI 34.41 kg/m  Physical Exam Vitals and nursing Sullivan reviewed.  Constitutional:      General: She is not in acute distress.    Appearance: Normal appearance. She is well-developed.  HENT:     Head: Normocephalic and atraumatic.  Eyes:     Conjunctiva/sclera: Conjunctivae normal.  Pupils: Pupils are equal, round, and reactive to light.  Cardiovascular:     Rate and Rhythm: Normal rate and regular rhythm.     Heart sounds: Normal heart sounds.  Pulmonary:     Effort: Pulmonary effort is normal. No respiratory distress.     Breath sounds: Normal breath sounds.  Abdominal:     General: There is no distension.     Palpations: Abdomen is soft.     Tenderness: There is no abdominal tenderness.  Musculoskeletal:        General: No deformity.  Normal range of motion.     Cervical back: Normal range of motion and neck supple.  Skin:    General: Skin is warm and dry.  Neurological:     General: No focal deficit present.     Mental Status: She is alert and oriented to person, place, and time. Mental status is at baseline.     Cranial Nerves: No cranial nerve deficit.     Sensory: No sensory deficit.     Motor: No weakness.     Comments: Alert, oriented x 4, no speech abnormality, moves all 4 extremities equally with 5 out of 5 strength, no apparent neurologic deficit.     ED Results / Procedures / Treatments   Labs (all labs ordered are listed, but only abnormal results are displayed) Labs Reviewed  CBC WITH DIFFERENTIAL/PLATELET - Abnormal; Notable for the following components:      Result Value   Hemoglobin 15.7 (*)    HCT 46.1 (*)    Platelets 109 (*)    All other components within normal limits  COMPREHENSIVE METABOLIC PANEL - Abnormal; Notable for the following components:   CO2 21 (*)    Glucose, Bld 187 (*)    All other components within normal limits  AMMONIA - Abnormal; Notable for the following components:   Ammonia 37 (*)    All other components within normal limits  URINALYSIS, W/ REFLEX TO CULTURE (INFECTION SUSPECTED) - Abnormal; Notable for the following components:   APPearance HAZY (*)    Bacteria, UA RARE (*)    All other components within normal limits  RAPID URINE DRUG SCREEN, HOSP PERFORMED - Abnormal; Notable for the following components:   Tetrahydrocannabinol POSITIVE (*)    All other components within normal limits  CBG MONITORING, ED - Abnormal; Notable for the following components:   Glucose-Capillary 127 (*)    All other components within normal limits  RESP PANEL BY RT-PCR (RSV, FLU A&B, COVID)  RVPGX2  CULTURE, BLOOD (ROUTINE X 2)  CULTURE, BLOOD (ROUTINE X 2)  ETHANOL  LIPASE, BLOOD  PROTIME-INR  I-STAT CG4 LACTIC ACID, ED    EKG EKG Interpretation Date/Time:  Sunday September 08 2023 11:34:48 EDT Ventricular Rate:  62 PR Interval:  202 QRS Duration:  93 QT Interval:  416 QTC Calculation: 423 R Axis:   28  Text Interpretation: Sinus rhythm Borderline prolonged PR interval Anteroseptal infarct, age indeterminate Confirmed by Kristine Royal ([id number]) on 09/08/2023 12:04:03 PM  Radiology DG Chest Port 1 View  Result Date: 09/08/2023 CLINICAL DATA:  Weakness and confusion. EXAM: PORTABLE CHEST 1 VIEW COMPARISON:  View chest x-ray 05/18/2023 FINDINGS: The heart size and mediastinal contours are within normal limits. Both lungs are clear. The visualized skeletal structures are unremarkable. IMPRESSION: Negative one-view chest x-ray Electronically Signed   By: Marin Roberts M.D.   On: 09/08/2023 12:05   CT Head Wo Contrast  Result Date: 09/08/2023 CLINICAL DATA:  Mental status change, unknown cause EXAM: CT HEAD WITHOUT CONTRAST TECHNIQUE: Contiguous axial images were obtained from the base of the skull through the vertex without intravenous contrast. RADIATION DOSE REDUCTION: This exam was performed according to the departmental dose-optimization program which includes automated exposure control, adjustment of the mA and/or kV according to patient size and/or use of iterative reconstruction technique. COMPARISON:  None Available. FINDINGS: Brain: No evidence of acute infarction, hemorrhage, hydrocephalus, extra-axial collection or mass lesion/mass effect. Vascular: No hyperdense vessel or unexpected calcification. Skull: Normal. Negative for fracture or focal lesion. Sinuses/Orbits: No middle ear or mastoid effusion. There are frothy secretions in the left maxillary sinus and an air-fluid level in the right sphenoid sinus, which can be seen in the setting of acute sinusitis. Orbits are unremarkable. Other: None. IMPRESSION: 1. No acute intracranial abnormality. 2. Frothy secretions in the left maxillary sinus and an air-fluid level in the right sphenoid sinus, which can be  seen in the setting of acute sinusitis. Electronically Signed   By: Lorenza Cambridge M.D.   On: 09/08/2023 12:04    Procedures Procedures    Medications Ordered in ED Medications  morphine (PF) 4 MG/ML injection 4 mg (4 mg Intravenous Given 09/08/23 1221)  ondansetron (ZOFRAN) injection 4 mg (4 mg Intravenous Given 09/08/23 1221)    ED Course/ Medical Decision Making/ A&P                                 Medical Decision Making Amount and/or Complexity of Data Reviewed Labs: ordered. Radiology: ordered.  Risk Prescription drug management.    Medical Screen Complete  This patient presented to the ED with complaint of transient AMS.  This complaint involves an extensive number of treatment options. The initial differential diagnosis includes, but is not limited to, metabolic abnormality, intracranial pathology such as hemorrhage, polysubstance abuse, etc.  This presentation is: Acute, Self-Limited, Previously Undiagnosed, Uncertain Prognosis, Complicated, Systemic Symptoms, and Threat to Life/Bodily Function  Patient is presenting with transient confusion.  Symptoms have significantly improved if not resolved by time of ED evaluation.  Patient without specific acute complaint on evaluation other than mild headache.  Workup obtained is without significant acute abnormality.  Patient does report intermittent use of THC products.  She denies recent use of same.  Urine tox was positive for THC.  Ammonia is mildly elevated at 37.  However patient without clinical presentation consistent with encephalopathy.  CT imaging and other labs obtained are without significant acute abnormality.  On reevaluation the patient is significant improved.  She desires discharge home.  She was offered additional workup and/or observation.  Specifically she was offered admission and/or MRI brain.  She declined same.  She reports that she feels "great" and wants to go home.  She and her husband understand need  for close outpatient follow-up.  Strict return precautions given and understood.  Additional history obtained: External records from outside sources obtained and reviewed including prior ED visits and prior Inpatient records.    Lab Tests:  I ordered and personally interpreted labs.  The pertinent results include: CBC, CMP, COVID, flu, ammonia, urine toxicology screen, EtOH   Imaging Studies ordered:  I ordered imaging studies including chest x-ray, CT head I independently visualized and interpreted obtained imaging which showed NAD I agree with the radiologist interpretation.   Cardiac Monitoring:  The patient was maintained on a cardiac monitor.  I personally viewed and interpreted  the cardiac monitor which showed an underlying rhythm of: NSR   Medicines ordered:  I ordered medication including morphine, Zofran for pain and nausea Reevaluation of the patient after these medicines showed that the patient: resolved   Problem List / ED Course:  Transient AMS, headache   Reevaluation:  After the interventions noted above, I reevaluated the patient and found that they have: resolved   Disposition:  After consideration of the diagnostic results and the patients response to treatment, I feel that the patent would benefit from close outpatient follow-up.          Final Clinical Impression(s) / ED Diagnoses Final diagnoses:  Transient alteration of awareness    Rx / DC Orders ED Discharge Orders     None         Wynetta Fines, MD 09/08/23 1521

## 2023-09-13 LAB — CULTURE, BLOOD (ROUTINE X 2)
Culture: NO GROWTH
Special Requests: ADEQUATE

## 2023-11-08 ENCOUNTER — Encounter: Payer: Self-pay | Admitting: Gastroenterology

## 2023-11-11 ENCOUNTER — Telehealth: Payer: Self-pay

## 2023-11-11 DIAGNOSIS — K7460 Unspecified cirrhosis of liver: Secondary | ICD-10-CM

## 2023-11-11 NOTE — Telephone Encounter (Signed)
Labs and RUQ orders entered. Patient does not have MyChart.  Scheduled for Wed, 11-20 at 8:30 am at [facility], to arr at 8:15am NPO midnight. Called and Left detailed message for patient re: appointment information. Left Number to call if need to reschedule and asked her to go to the lab in the basement when she has her Ultrasound.

## 2023-11-11 NOTE — Telephone Encounter (Signed)
-----   Message from [facility] sent at 05/30/2023 12:15 PM EDT ----- Regarding: due for labs and RUQ U/S in Nov Patient with cirrhosis due for RUQ U/S and  cbc, cmet and inr in

## 2023-11-20 ENCOUNTER — Ambulatory Visit (HOSPITAL_COMMUNITY)
Admission: RE | Admit: 2023-11-20 | Discharge: 2023-11-20 | Disposition: A | Discharge status: Home / Self Care | Payer: 59 | Source: Ambulatory Visit | Attending: Gastroenterology | Admitting: Gastroenterology

## 2023-11-20 ENCOUNTER — Other Ambulatory Visit (INDEPENDENT_AMBULATORY_CARE_PROVIDER_SITE_OTHER): Payer: 59

## 2023-11-20 DIAGNOSIS — K7460 Unspecified cirrhosis of liver: Secondary | ICD-10-CM

## 2023-11-20 LAB — CBC WITH DIFFERENTIAL/PLATELET
Basophils Absolute: 0.1 10*3/uL (ref 0.0–0.1)
Basophils Relative: 0.6 % (ref 0.0–3.0)
Eosinophils Absolute: 0.3 10*3/uL (ref 0.0–0.7)
Eosinophils Relative: 3.9 % (ref 0.0–5.0)
HCT: 49.1 % — ABNORMAL HIGH (ref 36.0–46.0)
Hemoglobin: 16.6 g/dL — ABNORMAL HIGH (ref 12.0–15.0)
Lymphocytes Relative: 26.3 % (ref 12.0–46.0)
Lymphs Abs: 2.3 10*3/uL (ref 0.7–4.0)
MCHC: 33.8 g/dL (ref 30.0–36.0)
MCV: 91.4 fL (ref 78.0–100.0)
Monocytes Absolute: 0.7 10*3/uL (ref 0.1–1.0)
Monocytes Relative: 7.6 % (ref 3.0–12.0)
Neutro Abs: 5.5 10*3/uL (ref 1.4–7.7)
Neutrophils Relative %: 61.6 % (ref 43.0–77.0)
Platelets: 114 10*3/uL — ABNORMAL LOW (ref 150.0–400.0)
RBC: 5.37 Mil/uL — ABNORMAL HIGH (ref 3.87–5.11)
RDW: 12.6 % (ref 11.5–15.5)
WBC: 8.9 10*3/uL (ref 4.0–10.5)

## 2023-11-20 LAB — COMPREHENSIVE METABOLIC PANEL
ALT: 15 U/L (ref 0–35)
AST: 29 U/L (ref 0–37)
Albumin: 4.1 g/dL (ref 3.5–5.2)
Alkaline Phosphatase: 105 U/L (ref 39–117)
BUN: 6 mg/dL (ref 6–23)
CO2: 25 meq/L (ref 19–32)
Calcium: 9.3 mg/dL (ref 8.4–10.5)
Chloride: 107 meq/L (ref 96–112)
Creatinine, Ser: 0.65 mg/dL (ref 0.40–1.20)
GFR: 98.21 mL/min (ref >60.00)
Glucose, Bld: 142 mg/dL — ABNORMAL HIGH (ref 70–99)
Potassium: 3.5 meq/L (ref 3.5–5.1)
Sodium: 141 meq/L (ref 135–145)
Total Bilirubin: 1.4 mg/dL — ABNORMAL HIGH (ref 0.2–1.2)
Total Protein: 7.5 g/dL (ref 6.0–8.3)

## 2023-11-20 LAB — PROTIME-INR
INR: 1.3 {ratio} — ABNORMAL HIGH (ref 0.8–1.0)
Prothrombin Time: 14.1 s — ABNORMAL HIGH (ref 9.6–13.1)

## 2023-11-21 LAB — AFP TUMOR MARKER: AFP-Tumor Marker: 2.8 ng/mL

## 2023-11-22 ENCOUNTER — Other Ambulatory Visit: Payer: Self-pay | Admitting: *Deleted

## 2023-11-22 DIAGNOSIS — K7460 Unspecified cirrhosis of liver: Secondary | ICD-10-CM

## 2023-11-22 NOTE — Telephone Encounter (Signed)
See 11/20/23 imaging result note for further information

## 2023-11-22 NOTE — Telephone Encounter (Signed)
Inbound call from patient inquiring about lab results. Please advise.

## 2023-12-20 ENCOUNTER — Other Ambulatory Visit (INDEPENDENT_AMBULATORY_CARE_PROVIDER_SITE_OTHER): Payer: 59

## 2023-12-20 DIAGNOSIS — K7460 Unspecified cirrhosis of liver: Secondary | ICD-10-CM | POA: Diagnosis not present

## 2023-12-20 LAB — CBC WITH DIFFERENTIAL/PLATELET
Basophils Absolute: 0 10*3/uL (ref 0.0–0.1)
Basophils Relative: 0.6 % (ref 0.0–3.0)
Eosinophils Absolute: 0.3 10*3/uL (ref 0.0–0.7)
Eosinophils Relative: 4.4 % (ref 0.0–5.0)
HCT: 44.9 % (ref 36.0–46.0)
Hemoglobin: 15.1 g/dL — ABNORMAL HIGH (ref 12.0–15.0)
Lymphocytes Relative: 25.5 % (ref 12.0–46.0)
Lymphs Abs: 1.8 10*3/uL (ref 0.7–4.0)
MCHC: 33.7 g/dL (ref 30.0–36.0)
MCV: 92 fL (ref 78.0–100.0)
Monocytes Absolute: 0.5 10*3/uL (ref 0.1–1.0)
Monocytes Relative: 7.1 % (ref 3.0–12.0)
Neutro Abs: 4.4 10*3/uL (ref 1.4–7.7)
Neutrophils Relative %: 62.4 % (ref 43.0–77.0)
Platelets: 113 10*3/uL — ABNORMAL LOW (ref 150.0–400.0)
RBC: 4.88 Mil/uL (ref 3.87–5.11)
RDW: 12.7 % (ref 11.5–15.5)
WBC: 7.1 10*3/uL (ref 4.0–10.5)

## 2024-02-26 ENCOUNTER — Ambulatory Visit: Payer: 59 | Admitting: Gastroenterology

## 2024-02-26 ENCOUNTER — Other Ambulatory Visit (INDEPENDENT_AMBULATORY_CARE_PROVIDER_SITE_OTHER): Payer: 59

## 2024-02-26 ENCOUNTER — Encounter: Payer: Self-pay | Admitting: Gastroenterology

## 2024-02-26 VITALS — BP 124/72 | HR 75 | Ht 61.75 in | Wt 174.0 lb

## 2024-02-26 DIAGNOSIS — D131 Benign neoplasm of stomach: Secondary | ICD-10-CM

## 2024-02-26 DIAGNOSIS — K7460 Unspecified cirrhosis of liver: Secondary | ICD-10-CM

## 2024-02-26 DIAGNOSIS — R1011 Right upper quadrant pain: Secondary | ICD-10-CM

## 2024-02-26 DIAGNOSIS — Z860100 Personal history of colon polyps, unspecified: Secondary | ICD-10-CM

## 2024-02-26 LAB — CBC WITH DIFFERENTIAL/PLATELET
Basophils Absolute: 0 10*3/uL (ref 0.0–0.1)
Basophils Relative: 0.2 % (ref 0.0–3.0)
Eosinophils Absolute: 0.4 10*3/uL (ref 0.0–0.7)
Eosinophils Relative: 4 % (ref 0.0–5.0)
HCT: 48.2 % — ABNORMAL HIGH (ref 36.0–46.0)
Hemoglobin: 16.5 g/dL — ABNORMAL HIGH (ref 12.0–15.0)
Lymphocytes Relative: 29.3 % (ref 12.0–46.0)
Lymphs Abs: 3.1 10*3/uL (ref 0.7–4.0)
MCHC: 34.3 g/dL (ref 30.0–36.0)
MCV: 90.3 fL (ref 78.0–100.0)
Monocytes Absolute: 0.7 10*3/uL (ref 0.1–1.0)
Monocytes Relative: 6.8 % (ref 3.0–12.0)
Neutro Abs: 6.4 10*3/uL (ref 1.4–7.7)
Neutrophils Relative %: 59.7 % (ref 43.0–77.0)
Platelets: 153 10*3/uL (ref 150.0–400.0)
RBC: 5.34 Mil/uL — ABNORMAL HIGH (ref 3.87–5.11)
RDW: 13.4 % (ref 11.5–15.5)
WBC: 10.6 10*3/uL — ABNORMAL HIGH (ref 4.0–10.5)

## 2024-02-26 LAB — PROTIME-INR
INR: 1.3 {ratio} — ABNORMAL HIGH (ref 0.8–1.0)
Prothrombin Time: 13.8 s — ABNORMAL HIGH (ref 9.6–13.1)

## 2024-02-26 MED ORDER — SUFLAVE 178.7 G PO SOLR: 1.0000 | Freq: Once | ORAL | 0 refills | Status: AC

## 2024-02-26 NOTE — Patient Instructions (Addendum)
 You have been scheduled for an abdominal ultrasound at [facility] (1st floor of hospital) on Wednesday, 05-20-24 at 9:00 am. Please arrive 15 minutes prior to your appointment for registration. Make certain not to have anything to eat or drink 6 hours prior to your appointment. Should you need to reschedule your appointment, please contact radiology at [phone]. This test typically takes about 30 minutes to perform.    You have been scheduled for an endoscopy and colonoscopy. Please follow the written instructions given to you at your visit today.  If you use inhalers (even only as needed), please bring them with you on the day of your procedure.  DO NOT TAKE 7 DAYS PRIOR TO TEST- Trulicity (dulaglutide) Ozempic, Wegovy (semaglutide) Mounjaro (tirzepatide) Bydureon Bcise (exanatide extended release)  DO NOT TAKE 1 DAY PRIOR TO YOUR TEST Rybelsus (semaglutide) Adlyxin (lixisenatide) Victoza (liraglutide) Byetta (exanatide) ___________________________________________________________________________  Bonita Quin will receive your bowel preparation through [facility], which ensures the lowest copay and home delivery, with outreach via text or call from an [phone] number. Please respond promptly to avoid rescheduling of your procedure. If you are interested in alternative options or have any questions regarding your prep, please contact them at [phone] ____________________________________________________________________________  Your Provider Has Sent Your Bowel Prep Regimen To [facility]   [facility] will contact you to verify your information and collect your copay, if applicable. Enjoy the comfort of your home while your prescription is mailed to you, FREE of any shipping charges.   [facility] accepts all major insurance benefits and applies discounts & coupons.  Have additional questions?   Chat: [email or web address] Call: [phone] Email: [email or web address]  NCPDP: [id number]  How will [facility] contact you?  With a Welcome phone call,  a Welcome text and a checkout link in text form.  Texts you receive from [phone] Are NOT Spam.  *To set up delivery, you must complete the checkout process via link or speak to one of the patient care representatives. If [facility] is unable to reach you, your prescription may be delayed.  To avoid long hold times on the phone, you may also utilize the secure chat feature on the [facility] website to request that they call you back for transaction completion or to expedite your concerns.  _____________________________________________________________________  Please go to the lab in the basement of our building to have lab work done as you leave today. Hit "B" for basement when you get on the elevator.  When the doors open the lab is on your left.  We will call you with the results. Thank you. We will send your lab results to Dr. France Ravens office.   You will be due for labs in May.  Please go to the lab in the basement of our building when you have your ultrasound done. Our lab is located in the basement of our building, located at [address]. They are open Monday through Friday from 7:30 am to 5:00 pm. You do not need an appointment.  Thank you for entrusting me with your care and for choosing [facility], Dr. Ileene Patrick   If your blood pressure at your visit was 140/90 or greater, please contact your primary care physician to follow up on this. ______________________________________________________  If you are age 46 or older, your body mass index should be between 23-30. Your Body mass index is 32.08 kg/m. If this is out of the aforementioned range listed, please consider follow up with your Primary Care Provider.  If you are age  64 or younger, your body mass index should be between 19-25. Your Body mass index is 32.08 kg/m. If this is out of the aformentioned range listed, please  consider follow up with your Primary Care Provider.  ________________________________________________________  The [facility] providers would like to encourage you to use [facility] to communicate with providers for non-urgent requests or questions.  Due to long hold times on the telephone, sending your provider a message by [facility] may be a faster and more efficient way to get a response.  Please allow 48 business hours for a response.  Please remember that this is for non-urgent requests.  _______________________________________________________  Due to recent changes in healthcare laws, you may see the results of your imaging and laboratory studies on MyChart before your provider has had a chance to review them.  We understand that in some cases there may be results that are confusing or concerning to you. Not all laboratory results come back in the same time frame and the provider may be waiting for multiple results in order to interpret others.  Please give [place] 48 hours in order for your provider to thoroughly review all the results before contacting the office for clarification of your results.

## 2024-02-26 NOTE — Progress Notes (Signed)
 HPI :  58 year old female here for a follow-up visit for and cirrhosis.  She also has a history of colon polyps.   Cirrhosis history: Cirrhosis secondary to NASH.  This been confirmed on liver biopsy in 2018.  She initially was diagnosed with this when she was hospitalized for a diverticular bleed in May 2018 and was noted to incidentally have a cirrhotic appearing liver on imaging at that time.  She does not drink alcohol routinely.  No jaundice, no ascites on imaging.  She has had some elevated ammonia levels in the past but no overt encephalopathy.  No history of variceal bleeding.  Her last endoscopy was performed in 2018.  No varices.  She has had some mild thrombocytopenia.  SINCE LAST VISIT  She is doing well since have last seen her.  She has not had any decompensations that we are aware of.  No jaundice, no ascites, no GI bleeding, no overt hepatic encephalopathy.  She is not drinking any alcohol.  She had a right upper quadrant ultrasound last November which showed no hepatomas or new changes.  She inquires about her thrombocytopenia which has been in the low 100s at baseline.  She has a mild coagulopathy in light of her liver disease.  She endorses having worsening bruising over the past 4 to 5 months.  She states she bruises quite easily.  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  She has had her platelet counts checked for the past few months and they are stable in the low 100s.  She inquires about having that checked again.  She has not had any bleeding issues otherwise since this began.  She is not using any NSAIDs on a routine basis.  We had discussed doing an EGD and colonoscopy at her last visit but she was not able to do it.  She has not had an EGD since 2018, had a gastric adenoma removed on that exam and is due for surveillance of that as well.  She had a colonoscopy in 2018 and had some polyps removed at that time, due for  surveillance for those as well.  At the last visit she was seen more urgently for right upper quadrant pain.  At the time it had been ongoing for several months and was quite severe and quite bothersome to her.  She had been to the ED a few times, had multiple imaging tests over multiple months but her pain persisted.  She had a few CT scans of her abdomen pelvis and an ultrasound as well as CT scans of her chest and a cardiac CT and none of those revealed a cause.  I thought she had musculoskeletal pain, or she thought she may have pleurisy.  She states over time her symptoms have completely resolved and she no longer has right upper quadrant pain that bothering her.  She is very functional and active.   Prior workup: Colonoscopy 06/28/2017 - normal ileum, diverticulosis - highest burden in the left colon, 5 polyps tranverse to cecum, multiple left sided hyperplastic polyps, internal hemorrhoids, biopsies taken no evidence of microscopic colitis - some of polyps from transverse to right colon were adenomas / sessile serrated, all of polyps on left side were hyperplastic polyps   Liver biopsy done 07/08/2017 - c/w cirrhosis secondary to NASH   EGD 12/27/2017 - 3 cm hiatal hernia, grade a esophagitis, single gastric polyp normal duodenum pathology showed short segment of Barrett's  esophagus and gastric adenoma without dysplasia suggested started on omeprazole once daily.  Recall 1 year   1. Surgical [P], gastric polyp - GASTRIC ADENOMA, FOVEOLAR TYPE. - HIGH GRADE DYSPLASIA IS NOT IDENTIFIED. - SEE COMMENT. 2. Surgical [P], GE junction - INTESTINAL METAPLASIA (GOBLET CELL METAPLASIA) CONSISTENT WITH BARRETT'S ESOPHAGUS. - THERE IS NO EVIDENCE OF DYSPLASIA OR MALIGNANCY. - SEE COMMENT.   Echo 12/28/22: EF 65-70%, grade I DD   Cardiac CT 12/2022 -  IMPRESSION: 1. Coronary calcium score of 0. 2. Normal coronary origin with right dominance. 3. Normal coronary arteries. 4. Small  PFO. RECOMMENDATIONS: 1. No evidence of CAD (0%). Consider non-atherosclerotic causes of chest pain.     CT abdomen / pelvis 09/19/2022: IMPRESSION: 1. No acute intra-abdominal or pelvic pathology. 2. Sigmoid diverticulosis. No bowel obstruction. 3. Cirrhosis with mild splenomegaly. 4. Aortic Atherosclerosis (ICD10-I70.0).     CTA 11/02/2022: IMPRESSION: 1. No pulmonary emboli or acute abnormality. 2. Mild calcific coronary artery and aortic atherosclerosis. 3. Changes of cirrhosis of the liver with splenomegaly.     RUQ [place] 05/28/2023: IMPRESSION: Cirrhotic liver morphology without focal hepatic lesion.     CTA 05/18/2023: IMPRESSION: 1. No evidence of pulmonary embolism. 2. Clear lungs.   CT abdomen / pelvis 05/18/2023: IMPRESSION: 1. No acute abnormality in the abdomen or pelvis. 2. Nodular hepatic contour, splenomegaly, and trace perihepatic and pelvic free fluid, consistent with cirrhosis and portal hypertension. 3. Colonic diverticulosis without acute diverticulitis. 4.  Aortic Atherosclerosis (ICD10-I70.0).    RUQ [place] 11/20/23: IMPRESSION: Cirrhotic morphology of the liver. No focal lesion.    Past Medical History:  Diagnosis Date   Allergy    Anxiety    Asthma    Chronic kidney disease 1985   BLOCKED TUBE IN KIDNEY   Cirrhosis (HCC)    secondary to NASH   Discoid lupus    Dr Yetta Barre, dermatologist   GERD (gastroesophageal reflux disease)    Headache(784.0)    MIGRAINES   Insomnia    Seizures (HCC)    EPILEPSY- NO SEIZURES SINCE AGE 60   Sleep apnea    Tendonitis      Past Surgical History:  Procedure Laterality Date   ABDOMINAL HYSTERECTOMY     ANKLE RECONSTRUCTION  04/2009   APPENDECTOMY     CESAREAN SECTION     CHOLECYSTECTOMY     COLONOSCOPY WITH PROPOFOL N/A 05/22/2017   Procedure: COLONOSCOPY WITH PROPOFOL;  Surgeon: Ruffin Frederick, MD;  Location: [facility];  Service: Gastroenterology;  Laterality: N/A;   DIAGNOSTIC  LAPAROSCOPY     FOOT SURGERY Right    bone removel fro pinky toe   FOOT SURGERY Right    bone removed near little toe   KIDNEY SURGERY     right, straightened tube   LAPAROSCOPY  09/23/2012   Procedure: LAPAROSCOPY OPERATIVE;  Surgeon: Lavina Hamman, MD;  Location: [facility];  Service: Gynecology;  Laterality: N/A;   RIGHT OOPHORECTOMY     Family History  Problem Relation Age of Onset   Coronary artery disease Mother    Hypertension Mother    Hypertension Father    Dementia Father    Atrial fibrillation Father    Stroke Father    Squamous cell carcinoma Father    Congestive Heart Failure Father    Colon cancer Maternal Grandfather    Pancreatic cancer Other        Mat great grandfather   Esophageal cancer Paternal Uncle    Stomach cancer Paternal Uncle  Social History   Tobacco Use   Smoking status: Every Day    Current packs/day: 0.50    Types: Cigarettes   Smokeless tobacco: Never   Tobacco comments:    Cutting back  Vaping Use   Vaping status: Never Used  Substance Use Topics   Alcohol use: Yes    Comment: occ   Drug use: Yes    Types: Marijuana    Comment: cbd gummies   Current Outpatient Medications  Medication Sig Dispense Refill   albuterol (PROVENTIL) (2.5 MG/3ML) 0.083% nebulizer solution Take 3 mLs (2.5 mg total) by nebulization every 6 (six) hours as needed for wheezing or shortness of breath. 75 mL 12   cetirizine (ZYRTEC) 10 MG tablet Take 10 mg by mouth daily.     CINNAMON PO Take 1 tablet by mouth daily.     Cyanocobalamin (VITAMIN B-12) 2500 MCG SUBL Place 1 tablet under the tongue daily.     gabapentin (NEURONTIN) 300 MG capsule Take 900 mg by mouth at bedtime.     Misc Natural Products (ELDERBERRY/VITAMIN C/ZINC PO) Take 1 tablet by mouth daily.     Multiple Vitamins-Minerals (HAIR SKIN & NAILS PO) Take 1 tablet by mouth daily.     Omega 3-6-9 Fatty Acids (OMEGA 3-6-9 PO) Take 1 tablet by mouth daily.     omeprazole (PRILOSEC) 40 MG capsule  TAKE 1 CAPSULE BY MOUTH DAILY (Patient taking differently: Take 40 mg by mouth daily.) 90 capsule 1   OVER THE COUNTER MEDICATION Magnesium OTC One nightly     oxyCODONE (OXY IR/ROXICODONE) 5 MG immediate release tablet Take 5 mg by mouth every 4 (four) hours as needed for severe pain.     Turmeric 500 MG CAPS Take 1 capsule by mouth daily.     No current facility-administered medications for this visit.   Allergies  Allergen Reactions   Symbicort [Budesonide-Formoterol Fumarate] Other (See Comments)    Caused open sores in mouth    Tramadol Shortness Of Breath   Amoxicillin-Pot Clavulanate     REACTION: dizziness, fatigue/drowsiness and nausea   Azithromycin     Other Reaction(s): lips and tongue numbness   Bactrim [Sulfamethoxazole-Trimethoprim]     Blister in mouth   Ciprofloxacin Hcl     Blisters in mouth   Clindamycin/Lincomycin Other (See Comments)    Sores in mouth    Cymbalta [Duloxetine Hcl] Other (See Comments)    Per pt makes her "jacked up"   Doxycycline     Fatigue, sleepy, worse    Keflex [Cephalexin]     Blister in mouth   Wellbutrin [Bupropion]     Blisters in mouth     Review of Systems: All systems reviewed and negative except where noted in HPI.   MELD 3.0: 11 at 11/20/2023  8:55 AM MELD-Na: 11 at 11/20/2023  8:55 AM Calculated from: Serum Creatinine: 0.65 mg/dL (Using min of 1 mg/dL) at 16/10/9603  5:40 AM Serum Sodium: 141 mEq/L (Using max of 137 mEq/L) at 11/20/2023  8:55 AM Total Bilirubin: 1.4 mg/dL at 98/10/9146  8:29 AM Serum Albumin: 4.1 g/dL (Using max of 3.5 g/dL) at 56/21/3086  5:78 AM INR(ratio): 1.3 ratio at 11/20/2023  8:55 AM Age at listing (hypothetical): 57 years Sex: Female at 11/20/2023  8:55 AM   Lab Results  Component Value Date   NA 141 11/20/2023   CL 107 11/20/2023   K 3.5 11/20/2023   CO2 25 11/20/2023   BUN 6 11/20/2023   CREATININE 0.65  11/20/2023   GFR 98.21 11/20/2023   CALCIUM 9.3 11/20/2023   ALBUMIN 4.1  11/20/2023   GLUCOSE 142 (H) 11/20/2023    Lab Results  Component Value Date   ALT 15 11/20/2023   AST 29 11/20/2023   ALKPHOS 105 11/20/2023   BILITOT 1.4 (H) 11/20/2023   Lab Results  Component Value Date   INR 1.3 (H) 11/20/2023   INR 1.4 (H) 05/17/2023   INR 1.2 (H) 10/22/2022     Physical Exam: BP 124/72   Pulse 75   Ht 5' 1.75" (1.568 m)   Wt 174 lb (78.9 kg)   BMI 32.08 kg/m  Constitutional: Pleasant,well-developed, female in no acute distress. Neurological: Alert and oriented to person place and time. Skin: Skin is warm and dry.  Some bruising noted on bilateral arms with some spider angiomas. Psychiatric: Normal mood and affect. Behavior is normal.   ASSESSMENT: 58 y.o. female here for assessment of the following  1. Cirrhosis of liver without ascites, unspecified hepatic cirrhosis type (HCC)   2. History of colon polyps   3. Gastric adenoma   4. RUQ pain    We reviewed her history of cirrhosis, no decompensation since I have last seen her.  We discussed natural history of cirrhosis, risks of decompensations and HCC over time.  I discussed the need to keep an eye on her labs and imaging every 6 months for these purposes.  She understands.  Generally doing well at this time and is stable.  She is most concerned about her easy bruising.  I counseled her that mild thrombocytopenia is very common in cirrhosis and she has had this for some time and stable, in addition to some mild coagulopathy from her liver disease.  We can recheck her platelets and INR for peace of mind to make sure stable given her recent bruising and she would like to do that.  I would recommend she avoid NSAIDs in light of this issue.  She is overdue for an EGD and colonoscopy for variceal screening, gastric adenoma, colon polyps.  We discussed what these procedures entail, risks and benefits of them and anesthesia, she wants to proceed.  Further recommendations pending results.  She is due for  repeat HCC screening in May as well as LFTs and INR.  Fortunately since I have last seen her her previously bothersome right upper quadrant pain has since resolved and doing much better in that light.  Extensive workup as outlined.   PLAN: - lab today - INR and CBC today - she asks we also send results to PCP Irena Reichmann - EGD and colonoscopy at the [facility] - due for RUQ [place] in May - LFTs and AFP in May - fu 6 months or sooner with issues  Harlin Rain, MD [facility]

## 2024-02-27 ENCOUNTER — Telehealth: Payer: Self-pay

## 2024-02-27 NOTE — Telephone Encounter (Signed)
-----   Message from [facility] Marylu Lund H sent at 02/26/2024 12:20 PM EST ----- Regarding: labs to PCP Send labs from 2-26 to patient's PCP, Irena Reichmann

## 2024-02-27 NOTE — Telephone Encounter (Signed)
 Labs faxed to Dr. Thomasena Edis office

## 2024-02-28 ENCOUNTER — Other Ambulatory Visit: Payer: Self-pay | Admitting: *Deleted

## 2024-02-28 ENCOUNTER — Telehealth: Payer: Self-pay | Admitting: Gastroenterology

## 2024-02-28 MED ORDER — ONDANSETRON 4 MG PO TBDP: 4.0000 mg | ORAL_TABLET | Freq: Three times a day (TID) | ORAL | 1 refills | Status: DC | PRN

## 2024-02-28 NOTE — Telephone Encounter (Signed)
 Inbound call from patient, states pharmacy has not received Zofran medication yet. Would like it sent in to [facility] pharmacy. Per most recent lab results.

## 2024-02-28 NOTE — Telephone Encounter (Signed)
 Zofran sent to pharmacy

## 2024-04-14 ENCOUNTER — Encounter: Payer: Self-pay | Admitting: Gastroenterology

## 2024-04-21 ENCOUNTER — Encounter: Payer: Self-pay | Admitting: Certified Registered Nurse Anesthetist

## 2024-04-21 ENCOUNTER — Telehealth: Payer: Self-pay | Admitting: Gastroenterology

## 2024-04-21 MED ORDER — NA SULFATE-K SULFATE-MG SULF 17.5-3.13-1.6 GM/177ML PO SOLN: 1.0000 | Freq: Once | ORAL | 0 refills | Status: AC

## 2024-04-21 NOTE — Telephone Encounter (Signed)
 Returned the patient's phone call. She requested Suprep be called into her Pharmacy rather then taking the Suflave  since she had that one in previous procedure and was concerned about tolerating the larger amount of fluid to take with the Suflave .

## 2024-04-21 NOTE — Telephone Encounter (Signed)
 Patient is scheduled for a endo and colon procedure for tomorrow with Dr. General Kenner. Patient stated that she was wanting to see if she can get a different prep medication sent over to her pharmacy due to her not using that specific prep medication before. Patient is requesting a call back. Please advise.

## 2024-04-21 NOTE — Telephone Encounter (Signed)
 A good call back number for this patient is [phone].

## 2024-04-22 ENCOUNTER — Encounter: Payer: Self-pay | Admitting: Gastroenterology

## 2024-04-22 ENCOUNTER — Ambulatory Visit: Payer: 59 | Admitting: Gastroenterology

## 2024-04-22 VITALS — BP 133/90 | HR 52 | Temp 98.6°F | Resp 13 | Ht 61.0 in | Wt 174.0 lb

## 2024-04-22 DIAGNOSIS — Z1211 Encounter for screening for malignant neoplasm of colon: Secondary | ICD-10-CM | POA: Diagnosis present

## 2024-04-22 DIAGNOSIS — D124 Benign neoplasm of descending colon: Secondary | ICD-10-CM | POA: Diagnosis not present

## 2024-04-22 DIAGNOSIS — D125 Benign neoplasm of sigmoid colon: Secondary | ICD-10-CM

## 2024-04-22 DIAGNOSIS — K648 Other hemorrhoids: Secondary | ICD-10-CM

## 2024-04-22 DIAGNOSIS — D131 Benign neoplasm of stomach: Secondary | ICD-10-CM

## 2024-04-22 DIAGNOSIS — D122 Benign neoplasm of ascending colon: Secondary | ICD-10-CM

## 2024-04-22 DIAGNOSIS — D128 Benign neoplasm of rectum: Secondary | ICD-10-CM

## 2024-04-22 DIAGNOSIS — K2289 Other specified disease of esophagus: Secondary | ICD-10-CM

## 2024-04-22 DIAGNOSIS — K317 Polyp of stomach and duodenum: Secondary | ICD-10-CM

## 2024-04-22 DIAGNOSIS — Z860100 Personal history of colon polyps, unspecified: Secondary | ICD-10-CM

## 2024-04-22 DIAGNOSIS — D123 Benign neoplasm of transverse colon: Secondary | ICD-10-CM | POA: Diagnosis not present

## 2024-04-22 DIAGNOSIS — K5730 Diverticulosis of large intestine without perforation or abscess without bleeding: Secondary | ICD-10-CM

## 2024-04-22 DIAGNOSIS — D120 Benign neoplasm of cecum: Secondary | ICD-10-CM

## 2024-04-22 DIAGNOSIS — K7460 Unspecified cirrhosis of liver: Secondary | ICD-10-CM

## 2024-04-22 MED ORDER — SODIUM CHLORIDE 0.9 % IV SOLN: 500.0000 mL | Freq: Once | INTRAVENOUS | Status: DC

## 2024-04-22 NOTE — Progress Notes (Signed)
 In PACU pt continuously complained about her IV and repeatedly stated to her mother that we treated her like a pincushion with her IV. Pt only received one IV stick during this visit. RN assured pt that her IV had been removed already and that the CRNA did not report any complications with the IV during the procedure. Pt irritable and argumentative with RN in PACU. Pt and her mother were observed talking loudly behind the curtain while getting dressed about how the pt was being treated like just another number, and they were using foul language as well when talking about the facility and staff. Stating "I won't be back here." RN had to ask the pt multiple times to be patient and not take off vital sign monitoring devices and that the RN would remove them when it was time for her to get dressed. While transporting pt to the car she repeatedly talked down about staff, stating that specific staff members were "bitches" and her mother was observed telling the pt that she "should have slapped that one nurse that stuck her with the IV." MD made aware of pt's behavior.

## 2024-04-22 NOTE — Progress Notes (Signed)
 [facility] History and Physical   Primary Care Physician:  Pete Brand, DO   Reason for Procedure:   Cirrhosis / gastric adenoma, history of colon polyps  Plan:    EGD and colonoscopy     HPI: Donna Sullivan is a 58 y.o. female  here for EGD and colonoscopy - history of cirrhosis, history of gastric adenoma in 2018, last EGD in 2018. History of numerous colon polyps, sessile serrated and hyperplastic, last colonoscopy in 2018.   Patient denies any bowel symptoms at this time. No family history of colon cancer known. Otherwise feels well without any cardiopulmonary symptoms.   I have discussed risks / benefits of anesthesia and endoscopic procedure with Brock Canner and they wish to proceed with the exams as outlined today.    Past Medical History:  Diagnosis Date   Allergy    Anxiety    Asthma    Chronic kidney disease 1985   BLOCKED TUBE IN KIDNEY   Cirrhosis (HCC)    secondary to NASH   COPD (chronic obstructive pulmonary disease) (HCC)    Discoid lupus    Dr Rochelle Chu, dermatologist   GERD (gastroesophageal reflux disease)    Headache(784.0)    MIGRAINES   Insomnia    Seizures (HCC)    EPILEPSY- NO SEIZURES SINCE AGE 79   Sleep apnea    Tendonitis     Past Surgical History:  Procedure Laterality Date   ABDOMINAL HYSTERECTOMY     ANKLE RECONSTRUCTION  04/2009   APPENDECTOMY     CESAREAN SECTION     CHOLECYSTECTOMY     COLONOSCOPY     COLONOSCOPY WITH PROPOFOL  N/A 05/22/2017   Procedure: COLONOSCOPY WITH PROPOFOL ;  Surgeon: Danette Duos, MD;  Location: [facility];  Service: Gastroenterology;  Laterality: N/A;   DIAGNOSTIC LAPAROSCOPY     FOOT SURGERY Right    bone removel fro pinky toe   FOOT SURGERY Right    bone removed near little toe   KIDNEY SURGERY     right, straightened tube   LAPAROSCOPY  09/23/2012   Procedure: LAPAROSCOPY OPERATIVE;  Surgeon: Cyd Dowse, MD;  Location: [facility];  Service: Gynecology;  Laterality: N/A;    RIGHT OOPHORECTOMY     UPPER GASTROINTESTINAL ENDOSCOPY      Prior to Admission medications   Medication Sig Start Date End Date Taking? Authorizing Provider  cetirizine  (ZYRTEC ) 10 MG tablet Take 10 mg by mouth daily.   Yes [provider]  CINNAMON PO Take 1 tablet by mouth daily.   Yes [provider]  Cyanocobalamin (VITAMIN B-12) 2500 MCG SUBL Place 1 tablet under the tongue daily.   Yes [provider]  gabapentin (NEURONTIN) 300 MG capsule Take 900 mg by mouth at bedtime. 01/23/19  Yes [provider]  GAVILYTE-G 236 g solution SMARTSIG:1 Kit(s) By Mouth Once 02/26/24  Yes [provider]  Misc Natural Products (ELDERBERRY/VITAMIN C/ZINC PO) Take 1 tablet by mouth daily.   Yes [provider]  Multiple Vitamins-Minerals (HAIR SKIN & NAILS PO) Take 1 tablet by mouth daily.   Yes [provider]  Omega 3-6-9 Fatty Acids (OMEGA 3-6-9 PO) Take 1 tablet by mouth daily.   Yes [provider]  omeprazole  (PRILOSEC) 40 MG capsule TAKE 1 CAPSULE BY MOUTH DAILY Patient taking differently: Take 40 mg by mouth daily. 09/05/18  Yes Normal Recinos, Lendon Queen, MD  ondansetron  (ZOFRAN -ODT) 4 MG disintegrating tablet Take 1 tablet (4 mg total) by mouth every 8 (eight)  hours as needed for nausea or vomiting. 02/28/24  Yes Noach Calvillo, Lendon Queen, MD  OVER THE COUNTER MEDICATION Magnesium OTC One nightly   Yes [provider]  oxyCODONE  (OXY IR/ROXICODONE ) 5 MG immediate release tablet Take 5 mg by mouth every 4 (four) hours as needed for severe pain.   Yes [provider]  Turmeric 500 MG CAPS Take 1 capsule by mouth daily.   Yes [provider]  albuterol  (PROVENTIL ) (2.5 MG/3ML) 0.083% nebulizer solution Take 3 mLs (2.5 mg total) by nebulization every 6 (six) hours as needed for wheezing or shortness of breath. 01/17/17   Amil Balding, PA-C    Current Outpatient Medications  Medication Sig Dispense Refill    cetirizine  (ZYRTEC ) 10 MG tablet Take 10 mg by mouth daily.     CINNAMON PO Take 1 tablet by mouth daily.     Cyanocobalamin (VITAMIN B-12) 2500 MCG SUBL Place 1 tablet under the tongue daily.     gabapentin (NEURONTIN) 300 MG capsule Take 900 mg by mouth at bedtime.     GAVILYTE-G 236 g solution SMARTSIG:1 Kit(s) By Mouth Once     Misc Natural Products (ELDERBERRY/VITAMIN C/ZINC PO) Take 1 tablet by mouth daily.     Multiple Vitamins-Minerals (HAIR SKIN & NAILS PO) Take 1 tablet by mouth daily.     Omega 3-6-9 Fatty Acids (OMEGA 3-6-9 PO) Take 1 tablet by mouth daily.     omeprazole  (PRILOSEC) 40 MG capsule TAKE 1 CAPSULE BY MOUTH DAILY (Patient taking differently: Take 40 mg by mouth daily.) 90 capsule 1   ondansetron  (ZOFRAN -ODT) 4 MG disintegrating tablet Take 1 tablet (4 mg total) by mouth every 8 (eight) hours as needed for nausea or vomiting. 30 tablet 1   OVER THE COUNTER MEDICATION Magnesium OTC One nightly     oxyCODONE  (OXY IR/ROXICODONE ) 5 MG immediate release tablet Take 5 mg by mouth every 4 (four) hours as needed for severe pain.     Turmeric 500 MG CAPS Take 1 capsule by mouth daily.     albuterol  (PROVENTIL ) (2.5 MG/3ML) 0.083% nebulizer solution Take 3 mLs (2.5 mg total) by nebulization every 6 (six) hours as needed for wheezing or shortness of breath. 75 mL 12   Current Facility-Administered Medications  Medication Dose Route Frequency Provider Last Rate Last Admin   0.9 %  sodium chloride  infusion  500 mL Intravenous Once Jaquarius Seder, Lendon Queen, MD        Allergies as of 04/22/2024 - Review Complete 04/22/2024  Allergen Reaction Noted   Symbicort  [budesonide -formoterol  fumarate] Other (See Comments) 04/21/2015   Tramadol  Shortness Of Breath 03/05/2014   Amoxicillin-pot clavulanate Other (See Comments) 01/23/2011   Azithromycin  Other (See Comments) 09/08/2023   Bactrim [sulfamethoxazole -trimethoprim] Other (See Comments) 12/04/2012   Ciprofloxacin hcl Other (See Comments)  09/11/2011   Clindamycin /lincomycin Other (See Comments) 08/18/2015   Cymbalta  [duloxetine  hcl] Other (See Comments) 05/21/2017   Doxycycline  Other (See Comments) 05/13/2012   Keflex  [cephalexin ] Other (See Comments) 12/04/2012   Wellbutrin  [bupropion ] Other (See Comments) 11/14/2012    Family History  Problem Relation Age of Onset   Coronary artery disease Mother    Hypertension Mother    Hypertension Father    Dementia Father    Atrial fibrillation Father    Stroke Father    Squamous cell carcinoma Father    Congestive Heart Failure Father    Colon cancer Maternal Grandfather    Pancreatic cancer Other        Mat great grandfather  Esophageal cancer Paternal Uncle    Stomach cancer Paternal Uncle     Social History   Socioeconomic History   Marital status: Married    Spouse name: Not on file   Number of children: 1   Years of education: Not on file   Highest education level: Not on file  Occupational History   Occupation: caregiver  Tobacco Use   Smoking status: Every Day    Current packs/day: 0.50    Types: Cigarettes   Smokeless tobacco: Never   Tobacco comments:    Cutting back  Vaping Use   Vaping status: Never Used  Substance and Sexual Activity   Alcohol use: Yes    Comment: occ   Drug use: Yes    Types: Marijuana    Comment: cbd gummies   Sexual activity: Yes    Birth control/protection: Surgical, Post-menopausal  Other Topics Concern   Not on file  Social History Narrative   Lives with daughter.   Social Drivers of Corporate investment banker Strain: Not on file  Food Insecurity: Not on file  Transportation Needs: Not on file  Physical Activity: Not on file  Stress: Not on file  Social Connections: Unknown (05/04/2022)   Received from [facility]   Social Network    Social Network: Not on file  Intimate Partner Violence: Unknown (04/02/2022)   Received from [facility], [facility]   HITS    Physically Hurt: Not on  file    Insult or Talk Down To: Not on file    Threaten Physical Harm: Not on file    Scream or Curse: Not on file    Review of Systems: All other review of systems negative except as mentioned in the HPI.  Physical Exam: Vital signs BP (!) 136/50   Pulse 66   Temp 98.6 F (37 C) (Temporal)   Ht 5\' 1"  (1.549 m)   Wt 174 lb (78.9 kg)   SpO2 96%   BMI 32.88 kg/m   General:   Alert,  Well-developed, pleasant and cooperative in NAD Lungs:  Clear throughout to auscultation.   Heart:  Regular rate and rhythm Abdomen:  Soft, nontender and nondistended.   Neuro/Psych:  Alert and cooperative. Normal mood and affect. A and O x 3  Christi Coward, MD [facility]

## 2024-04-22 NOTE — Patient Instructions (Signed)
 Handouts provided on polyps, diverticulosis and hemorrhoids.  Resume previous diet.  Continue present medications.  Await pathology results.   YOU HAD AN ENDOSCOPIC PROCEDURE TODAY AT THE [facility]:   Refer to the procedure report that was given to you for any specific questions about what was found during the examination.  If the procedure report does not answer your questions, please call your gastroenterologist to clarify.  If you requested that your care partner not be given the details of your procedure findings, then the procedure report has been included in a sealed envelope for you to review at your convenience later.  YOU SHOULD EXPECT: Some feelings of bloating in the abdomen. Passage of more gas than usual.  Walking can help get rid of the air that was put into your GI tract during the procedure and reduce the bloating. If you had a lower endoscopy (such as a colonoscopy or flexible sigmoidoscopy) you may notice spotting of blood in your stool or on the toilet paper. If you underwent a bowel prep for your procedure, you may not have a normal bowel movement for a few days.  Please Note:  You might notice some irritation and congestion in your nose or some drainage.  This is from the oxygen  used during your procedure.  There is no need for concern and it should clear up in a day or so.  SYMPTOMS TO REPORT IMMEDIATELY:  Following lower endoscopy (colonoscopy or flexible sigmoidoscopy):  Excessive amounts of blood in the stool  Significant tenderness or worsening of abdominal pains  Swelling of the abdomen that is new, acute  Fever of 100F or higher  Following upper endoscopy (EGD)  Vomiting of blood or coffee ground material  New chest pain or pain under the shoulder blades  Painful or persistently difficult swallowing  New shortness of breath  Fever of 100F or higher  Black, tarry-looking stools  For urgent or emergent issues, a gastroenterologist can be reached  at any hour by calling [phone]. Do not use MyChart messaging for urgent concerns.    DIET:  We do recommend a small meal at first, but then you may proceed to your regular diet.  Drink plenty of fluids but you should avoid alcoholic beverages for 24 hours.  ACTIVITY:  You should plan to take it easy for the rest of today and you should NOT DRIVE or use heavy machinery until tomorrow (because of the sedation medicines used during the test).    FOLLOW UP: Our staff will call the number listed on your records the next business day following your procedure.  We will call around 7:15- 8:00 am to check on you and address any questions or concerns that you may have regarding the information given to you following your procedure. If we do not reach you, we will leave a message.     If any biopsies were taken you will be contacted by phone or by letter within the next 1-3 weeks.  Please call us  at [phone] if you have not heard about the biopsies in 3 weeks.    SIGNATURES/CONFIDENTIALITY: You and/or your care partner have signed paperwork which will be entered into your electronic medical record.  These signatures attest to the fact that that the information above on your After Visit Summary has been reviewed and is understood.  Full responsibility of the confidentiality of this discharge information lies with you and/or your care-partner.

## 2024-04-22 NOTE — Progress Notes (Signed)
 UPDATED MEDICAL RECORD, pt states she only drank part of prep last night bc she was clear and hasn't eaten since Sunday night, notified Dr General Kenner. Let pt know regardless prep is meant to be completed both days for everyone, pt states she knows she didn't need anymore of the prep. Pt states she called yesterday for a different prep to be sent, pharmacy was out of suprep and since pt hadnt picked up the prep since her ov was in February, they had to substitute what they had.

## 2024-04-22 NOTE — Progress Notes (Signed)
1115 Robinul 0.1 mg IV given due large amount of secretions upon assessment.  MD made aware, vss 

## 2024-04-22 NOTE — Progress Notes (Signed)
 Report given to PACU, vss

## 2024-04-22 NOTE — Op Note (Signed)
 [facility] Patient Name: Donna Sullivan Procedure Date: 04/22/2024 11:13 AM MRN: [id number] Endoscopist: Landon Pinion P. General Kenner , MD, [id number] Age: 58 Referring MD:  Date of Birth: 13-Feb-1966 Gender: Female Account #: [id number] Procedure:                Colonoscopy Indications:              High risk colon cancer surveillance: Personal                            history of colonic polyps - numerous polyps removed                            2018 - multiple sessile serrated polps and                            hyperplastic Medicines:                Monitored Anesthesia Care Procedure:                Pre-Anesthesia Assessment:                           - Prior to the procedure, a History and Physical                            was performed, and patient medications and                            allergies were reviewed. The patient's tolerance of                            previous anesthesia was also reviewed. The risks                            and benefits of the procedure and the sedation                            options and risks were discussed with the patient.                            All questions were answered, and informed consent                            was obtained. Prior Anticoagulants: The patient has                            taken no anticoagulant or antiplatelet agents. ASA                            Grade Assessment: III - A patient with severe                            systemic disease. After reviewing the risks and  benefits, the patient was deemed in satisfactory                            condition to undergo the procedure.                           After obtaining informed consent, the colonoscope                            was passed under direct vision. Throughout the                            procedure, the patient's blood pressure, pulse, and                            oxygen  saturations were monitored continuously.  The                            Olympus Scope SN: [id number] was introduced through                            the anus and advanced to the the cecum, identified                            by appendiceal orifice and ileocecal valve. The                            colonoscopy was performed without difficulty. The                            patient tolerated the procedure well. The quality                            of the bowel preparation was adequate. The                            ileocecal valve, appendiceal orifice, and rectum                            were photographed. Scope In: 11:31:45 AM Scope Out: 12:09:38 PM Scope Withdrawal Time: 0 hours 32 minutes 3 seconds  Total Procedure Duration: 0 hours 37 minutes 53 seconds  Findings:                 The perianal and digital rectal examinations were                            normal.                           A 5 mm polyp was found in the cecum. The polyp was                            sessile. The polyp was removed with a cold snare.  Resection and retrieval were complete.                           A diminutive polyp was found in the ascending                            colon. The polyp was sessile. The polyp was removed                            with a cold snare. Resection and retrieval were                            complete.                           A diminutive polyp was found in the hepatic                            flexure. The polyp was sessile. The polyp was                            removed with a cold snare. Resection and retrieval                            were complete.                           Three flat and sessile polyps were found in the                            transverse colon. The polyps were 4 to 6 mm in                            size. These polyps were removed with a cold snare.                            Resection and retrieval were complete.                           A 5 mm  polyp was found in the descending colon. The                            polyp was sessile. The polyp was removed with a                            cold snare. Resection and retrieval were complete.                           Numerous polyps noted in the sigmoid colon, mostly                            all appeared hyperplastic. The polyps were 3 to 6  mm in size. Two of which appeared adenomatous and                            included in the bottle with transverse polyps. The                            rest appeared hyperplastic, a few of the largest                            (3) were removed as a representative samples,                            suspect hyperplastic. These polyps were removed                            with a cold snare. Resection and retrieval were                            complete.                           A 3 mm polyp was found in the rectum. The polyp was                            sessile. The polyp was removed with a cold snare.                            Resection and retrieval were complete.                           Multiple small-mouthed diverticula were found in                            the entire colon.                           Internal hemorrhoids were found during retroflexion.                           The exam was otherwise without abnormality. Prep                            was adequate but several minutes taken to lavage                            the colon to achieve adequate views. In addition,                            poor air retention was noted in the left colon                            which prolonged the exam. Complications:            No immediate complications. Estimated blood loss:  Minimal. Estimated Blood Loss:     Estimated blood loss was minimal. Impression:               - One 5 mm polyp in the cecum, removed with a cold                            snare. Resected and  retrieved.                           - One diminutive polyp in the ascending colon,                            removed with a cold snare. Resected and retrieved.                           - One diminutive polyp at the hepatic flexure,                            removed with a cold snare. Resected and retrieved.                           - Three 4 to 6 mm polyps in the transverse colon,                            removed with a cold snare. Resected and retrieved.                           - One 5 mm polyp in the descending colon, removed                            with a cold snare. Resected and retrieved.                           - Multiple suspected hyperplastic polyps in the                            left colon, a few of which appeared adenomatous.                            Several removed with a cold snare as outlined.                            Resected and retrieved.                           - One 3 mm polyp in the rectum, removed with a cold                            snare. Resected and retrieved.                           - Diverticulosis in the entire examined colon.                           -  Internal hemorrhoids.                           - The examination was otherwise normal. Recommendation:           - Patient has a contact number available for                            emergencies. The signs and symptoms of potential                            delayed complications were discussed with the                            patient. Return to normal activities tomorrow.                            Written discharge instructions were provided to the                            patient.                           - Resume previous diet.                           - Continue present medications.                           - Await pathology results. Landon Pinion P. General Kenner, MD 04/22/2024 12:23:46 PM This report has been signed electronically.

## 2024-04-22 NOTE — Progress Notes (Signed)
 Called to room to assist during endoscopic procedure.  Patient ID and intended procedure confirmed with present staff. Received instructions for my participation in the procedure from the performing physician.

## 2024-04-22 NOTE — Op Note (Signed)
 [facility] Patient Name: Donna Sullivan Procedure Date: 04/22/2024 11:14 AM MRN: [id number] Endoscopist: Landon Pinion P. General Kenner , MD, [id number] Age: 58 Referring MD:  Date of Birth: [birth year] Gender: Female Account #: [id number] Procedure:                Upper GI endoscopy Indications:              Cirrhosis rule out esophageal varices, Follow-up of                            gastric polyp - historyof gastric adenoma removed                            in 2018, history of Barrett's on omeprazole  Medicines:                Monitored Anesthesia Care Procedure:                Pre-Anesthesia Assessment:                           - Prior to the procedure, a History and Physical                            was performed, and patient medications and                            allergies were reviewed. The patient's tolerance of                            previous anesthesia was also reviewed. The risks                            and benefits of the procedure and the sedation                            options and risks were discussed with the patient.                            All questions were answered, and informed consent                            was obtained. Prior Anticoagulants: The patient has                            taken no anticoagulant or antiplatelet agents. ASA                            Grade Assessment: III - A patient with severe                            systemic disease. After reviewing the risks and                            benefits, the patient was deemed in satisfactory  condition to undergo the procedure.                           After obtaining informed consent, the endoscope was                            passed under direct vision. Throughout the                            procedure, the patient's blood pressure, pulse, and                            oxygen  saturations were monitored continuously. The                             GIF [id number] #[id number] was introduced through the                            mouth, and advanced to the second part of duodenum.                            The upper GI endoscopy was accomplished without                            difficulty. The patient tolerated the procedure                            well. Scope In: Scope Out: Findings:                 Esophagogastric landmarks were identified: the                            Z-line was found at 40 cm, the gastroesophageal                            junction was found at 40 cm and the upper extent of                            the gastric folds was found at 40 cm from the                            incisors.                           The Z-line was just slightly irregular by a few mm,                            did not meet criteria for Barrett's.                           The exam of the esophagus was otherwise normal. No                            varices  A single 3 mm sessile polyp was found in the                            gastric body. The polyp was removed with a cold                            biopsy forceps. Resection and retrieval were                            complete.                           The exam of the stomach was otherwise normal. No                            varices.                           The examined duodenum was normal. Complications:            No immediate complications. Estimated blood loss:                            Minimal. Estimated Blood Loss:     Estimated blood loss was minimal. Impression:               - Esophagogastric landmarks identified.                           - Z-line slightly irregular but did not meet                            criteria for Barrett's.                           - Normal esophagus otherwise - no varices.                           - A single gastric polyp. Resected and retrieved.                           - Normal stomach otherwise - no  varices.                           - Normal examined duodenum. Recommendation:           - Patient has a contact number available for                            emergencies. The signs and symptoms of potential                            delayed complications were discussed with the                            patient. Return to normal activities tomorrow.  Written discharge instructions were provided to the                            patient.                           - Resume previous diet.                           - Continue present medications.                           - Await pathology results.                           - Anticipate repeat EGD in 2-3 years for screening                            of varices, pending her course Lendon Queen. Leesha Veno, MD 04/22/2024 12:25:27 PM This report has been signed electronically.

## 2024-04-23 ENCOUNTER — Telehealth: Payer: Self-pay | Admitting: *Deleted

## 2024-04-23 ENCOUNTER — Telehealth: Payer: Self-pay

## 2024-04-23 NOTE — Telephone Encounter (Signed)
 Follow up call regarding yesterday's procedure. No answer, voicemail left for patient to call Dr. Tena Feeling office with any issues.

## 2024-04-23 NOTE — Telephone Encounter (Signed)
 Patient is returning call and stated that she is having cramping and would like to know if that was normal after a colonoscopy and EGD. Patient stated that she has never felt cramping before whenever she has had colonoscopy and EGD in the past. Patient is requesting a call back. Please advise.

## 2024-04-23 NOTE — Telephone Encounter (Signed)
 Thanks Sarah, appreciate you letting me know.  Agree with your recommendations.  If only a small amount with wiping then would continue to monitor, does not sound like any significant bleeding.  If something changes or persist she should let us  know.  Thank you

## 2024-04-23 NOTE — Telephone Encounter (Signed)
 Returned the patient's phone call.  She reports that she is still having a small amount of blood when she wipes. No large amount of blood in the toilet and this is lessening.. She also reported some occasional cramping on the left side of her abdomen 3/10. Encouraged her to be mobile today, increase her fluid intake and take some Gas X if needed. She is to let us  know if symptoms worsen.

## 2024-04-24 ENCOUNTER — Encounter: Payer: Self-pay | Admitting: Gastroenterology

## 2024-04-24 LAB — SURGICAL PATHOLOGY

## 2024-04-29 ENCOUNTER — Telehealth: Payer: Self-pay

## 2024-04-29 DIAGNOSIS — K7460 Unspecified cirrhosis of liver: Secondary | ICD-10-CM

## 2024-04-29 NOTE — Telephone Encounter (Signed)
 Orders placed for labs.  Called patient and LM with lab hours and asked that she go in the next week or two

## 2024-04-29 NOTE — Telephone Encounter (Signed)
-----   Message from [facility] sent at 02/27/2024  1:37 PM EST ----- Regarding: due for labs in May LFTs, AFP and CBC due in May  Patient may cancel her RUQ in May due to how expensive it is and only do yearly. [place] aware

## 2024-05-07 ENCOUNTER — Other Ambulatory Visit: Payer: Self-pay

## 2024-05-07 MED ORDER — ONDANSETRON 4 MG PO TBDP: 4.0000 mg | ORAL_TABLET | Freq: Three times a day (TID) | ORAL | 1 refills | Status: DC | PRN

## 2024-05-07 NOTE — Telephone Encounter (Signed)
 Patient requesting medication refill for zofran . Also states she is having bleeding in stool. As well as states she will come in for labs next week. Please advise.   Thank you

## 2024-05-07 NOTE — Telephone Encounter (Signed)
 Refill sent in for zofran . Pt states she saw some blood in her stool last week, pt has hx of internal hems and just had colon done in April. States she will come next week for the labs.

## 2024-05-20 ENCOUNTER — Ambulatory Visit (HOSPITAL_COMMUNITY): Payer: 59

## 2024-07-27 ENCOUNTER — Other Ambulatory Visit: Payer: Self-pay

## 2024-07-27 MED ORDER — ONDANSETRON 4 MG PO TBDP: 4.0000 mg | ORAL_TABLET | Freq: Three times a day (TID) | ORAL | 0 refills | Status: DC | PRN

## 2024-07-27 NOTE — Progress Notes (Signed)
 Refill request for zofran .  Patient needs an appointment in August. Thirty days sent.  She need to schedule an appointment for further refills

## 2024-08-26 ENCOUNTER — Telehealth: Payer: Self-pay | Admitting: Gastroenterology

## 2024-08-26 ENCOUNTER — Other Ambulatory Visit: Payer: Self-pay

## 2024-08-26 MED ORDER — ONDANSETRON 4 MG PO TBDP: 4.0000 mg | ORAL_TABLET | Freq: Three times a day (TID) | ORAL | 1 refills | Status: DC | PRN

## 2024-08-26 NOTE — Progress Notes (Signed)
 Patient has upcoming appointment in October. She has procedure with Dr. Leigh in April

## 2024-08-26 NOTE — Telephone Encounter (Signed)
 Patient is advised that she was supposed to have labs completed several different times over the last few months. Looks like she is also due for some imaging. Advised at this point, lets just have her come for office visit 09/2024 as scheduled. Dr Leigh can order whichever labs he needs that day and can also order any imaging etc. She states she cannot have ultrasound or imaging because she cannot afford it. I advised she should discuss this with Dr Leigh so they can come up with a plan. She verbalizes understanding and agrees to come for 09/2024 appointment.

## 2024-08-26 NOTE — Telephone Encounter (Signed)
 Inbound call from patient stating she would like to know if she needs to come in for lab work before appointment on 10/28.  Please advise  Thank you

## 2024-10-19 ENCOUNTER — Telehealth: Payer: Self-pay | Admitting: Gastroenterology

## 2024-10-19 MED ORDER — ONDANSETRON 4 MG PO TBDP: 4.0000 mg | ORAL_TABLET | Freq: Three times a day (TID) | ORAL | 1 refills | Status: AC | PRN

## 2024-10-19 NOTE — Telephone Encounter (Signed)
 Inbound call from patient stating she understands she needs to be seen in office for future refills of Zofran  but would like to know if she can be sent something since she has an upcoming appointment of 12/03/24. Patient was scheduled on 10/27/24 but had to reschedule due to having to take father to [place] appointments he has. Please advise  Thank you

## 2024-10-19 NOTE — Telephone Encounter (Signed)
 Refill sent for Zofran  to get to December appointment

## 2024-10-27 ENCOUNTER — Ambulatory Visit: Admitting: Nurse Practitioner

## 2024-12-03 ENCOUNTER — Ambulatory Visit: Admitting: Gastroenterology
